# Patient Record
Sex: Female | Born: 1935 | Race: Black or African American | Hispanic: No | Smoking: Never smoker
Health system: Southern US, Community
[De-identification: ages and names within clinical notes are randomized; demographics above are authoritative.]

## PROBLEM LIST (undated history)

## (undated) DIAGNOSIS — M109 Gout, unspecified: Secondary | ICD-10-CM

## (undated) DIAGNOSIS — C50919 Malignant neoplasm of unspecified site of unspecified female breast: Secondary | ICD-10-CM

## (undated) DIAGNOSIS — R945 Abnormal results of liver function studies: Secondary | ICD-10-CM

## (undated) DIAGNOSIS — E119 Type 2 diabetes mellitus without complications: Secondary | ICD-10-CM

## (undated) DIAGNOSIS — I10 Essential (primary) hypertension: Secondary | ICD-10-CM

## (undated) DIAGNOSIS — E785 Hyperlipidemia, unspecified: Secondary | ICD-10-CM

## (undated) DIAGNOSIS — C259 Malignant neoplasm of pancreas, unspecified: Secondary | ICD-10-CM

## (undated) DIAGNOSIS — R7989 Other specified abnormal findings of blood chemistry: Secondary | ICD-10-CM

## (undated) DIAGNOSIS — E079 Disorder of thyroid, unspecified: Secondary | ICD-10-CM

## (undated) DIAGNOSIS — K219 Gastro-esophageal reflux disease without esophagitis: Secondary | ICD-10-CM

## (undated) DIAGNOSIS — C55 Malignant neoplasm of uterus, part unspecified: Secondary | ICD-10-CM

## (undated) HISTORY — PX: ABDOMINAL HYSTERECTOMY: SHX81

## (undated) HISTORY — PX: KNEE ARTHROSCOPY: SUR90

---

## 1958-08-18 DIAGNOSIS — C55 Malignant neoplasm of uterus, part unspecified: Secondary | ICD-10-CM

## 1958-08-18 HISTORY — DX: Malignant neoplasm of uterus, part unspecified: C55

## 2004-06-13 ENCOUNTER — Ambulatory Visit: Payer: Self-pay | Admitting: Gastroenterology

## 2004-11-28 ENCOUNTER — Ambulatory Visit: Payer: Self-pay | Admitting: Unknown Physician Specialty

## 2005-06-23 ENCOUNTER — Inpatient Hospital Stay: Payer: Self-pay | Admitting: Orthopaedic Surgery

## 2005-06-27 ENCOUNTER — Encounter: Payer: Self-pay | Admitting: Internal Medicine

## 2005-07-18 ENCOUNTER — Encounter: Payer: Self-pay | Admitting: Internal Medicine

## 2005-08-18 ENCOUNTER — Encounter: Payer: Self-pay | Admitting: Internal Medicine

## 2005-09-18 ENCOUNTER — Encounter: Payer: Self-pay | Admitting: Internal Medicine

## 2005-10-16 ENCOUNTER — Encounter: Payer: Self-pay | Admitting: Internal Medicine

## 2005-11-16 ENCOUNTER — Encounter: Payer: Self-pay | Admitting: Internal Medicine

## 2005-12-16 ENCOUNTER — Encounter: Payer: Self-pay | Admitting: Internal Medicine

## 2006-01-06 ENCOUNTER — Ambulatory Visit: Payer: Self-pay | Admitting: Unknown Physician Specialty

## 2006-01-16 ENCOUNTER — Encounter: Payer: Self-pay | Admitting: Internal Medicine

## 2006-02-15 ENCOUNTER — Encounter: Payer: Self-pay | Admitting: Internal Medicine

## 2006-03-18 ENCOUNTER — Encounter: Payer: Self-pay | Admitting: Internal Medicine

## 2006-04-18 ENCOUNTER — Encounter: Payer: Self-pay | Admitting: Internal Medicine

## 2006-05-18 ENCOUNTER — Encounter: Payer: Self-pay | Admitting: Internal Medicine

## 2006-06-18 ENCOUNTER — Encounter: Payer: Self-pay | Admitting: Internal Medicine

## 2007-01-12 ENCOUNTER — Ambulatory Visit: Payer: Self-pay | Admitting: Unknown Physician Specialty

## 2007-01-15 ENCOUNTER — Ambulatory Visit: Payer: Self-pay | Admitting: Unknown Physician Specialty

## 2008-03-14 ENCOUNTER — Ambulatory Visit: Payer: Self-pay | Admitting: Unknown Physician Specialty

## 2008-09-29 ENCOUNTER — Ambulatory Visit: Payer: Self-pay | Admitting: Unknown Physician Specialty

## 2009-03-26 ENCOUNTER — Inpatient Hospital Stay: Payer: Self-pay | Admitting: Internal Medicine

## 2009-04-10 ENCOUNTER — Ambulatory Visit: Payer: Self-pay | Admitting: Unknown Physician Specialty

## 2009-04-17 ENCOUNTER — Inpatient Hospital Stay: Payer: Self-pay | Admitting: Internal Medicine

## 2009-05-01 ENCOUNTER — Observation Stay: Payer: Self-pay | Admitting: Internal Medicine

## 2009-05-05 ENCOUNTER — Encounter: Payer: Self-pay | Admitting: Internal Medicine

## 2009-05-18 ENCOUNTER — Encounter: Payer: Self-pay | Admitting: Internal Medicine

## 2009-10-17 ENCOUNTER — Ambulatory Visit: Payer: Self-pay | Admitting: Nephrology

## 2010-01-15 ENCOUNTER — Ambulatory Visit: Payer: Self-pay | Admitting: Unknown Physician Specialty

## 2010-02-22 ENCOUNTER — Encounter: Admission: RE | Admit: 2010-02-22 | Discharge: 2010-02-22 | Payer: Self-pay | Admitting: Diagnostic Neuroimaging

## 2010-05-14 ENCOUNTER — Ambulatory Visit: Payer: Self-pay | Admitting: Unknown Physician Specialty

## 2010-08-18 HISTORY — PX: BREAST BIOPSY: SHX20

## 2010-10-16 ENCOUNTER — Ambulatory Visit: Payer: Self-pay | Admitting: Unknown Physician Specialty

## 2010-12-10 ENCOUNTER — Ambulatory Visit: Payer: Self-pay | Admitting: General Practice

## 2010-12-23 ENCOUNTER — Inpatient Hospital Stay: Payer: Self-pay | Admitting: General Practice

## 2010-12-27 ENCOUNTER — Encounter: Payer: Self-pay | Admitting: Internal Medicine

## 2011-01-17 ENCOUNTER — Encounter: Payer: Self-pay | Admitting: Internal Medicine

## 2011-02-16 ENCOUNTER — Encounter: Payer: Self-pay | Admitting: Internal Medicine

## 2011-03-19 ENCOUNTER — Encounter: Payer: Self-pay | Admitting: Internal Medicine

## 2011-04-02 ENCOUNTER — Emergency Department: Payer: Self-pay | Admitting: *Deleted

## 2011-07-16 ENCOUNTER — Ambulatory Visit: Payer: Self-pay | Admitting: Unknown Physician Specialty

## 2011-07-28 ENCOUNTER — Ambulatory Visit: Payer: Self-pay | Admitting: Unknown Physician Specialty

## 2011-08-18 ENCOUNTER — Ambulatory Visit: Payer: Self-pay | Admitting: Surgery

## 2011-08-19 DIAGNOSIS — C50919 Malignant neoplasm of unspecified site of unspecified female breast: Secondary | ICD-10-CM

## 2011-08-19 HISTORY — DX: Malignant neoplasm of unspecified site of unspecified female breast: C50.919

## 2011-08-25 ENCOUNTER — Ambulatory Visit: Payer: Self-pay | Admitting: Surgery

## 2011-09-08 LAB — PATHOLOGY REPORT

## 2011-09-09 ENCOUNTER — Ambulatory Visit: Payer: Self-pay | Admitting: Surgery

## 2011-09-17 ENCOUNTER — Ambulatory Visit: Payer: Self-pay | Admitting: Oncology

## 2011-09-23 ENCOUNTER — Ambulatory Visit: Payer: Self-pay | Admitting: Oncology

## 2011-10-17 ENCOUNTER — Ambulatory Visit: Payer: Self-pay | Admitting: Oncology

## 2011-11-17 ENCOUNTER — Ambulatory Visit: Payer: Self-pay | Admitting: Oncology

## 2011-12-24 ENCOUNTER — Ambulatory Visit: Payer: Self-pay | Admitting: Oncology

## 2012-01-17 ENCOUNTER — Ambulatory Visit: Payer: Self-pay | Admitting: Oncology

## 2012-03-10 ENCOUNTER — Ambulatory Visit: Payer: Self-pay | Admitting: Gastroenterology

## 2012-07-05 ENCOUNTER — Ambulatory Visit: Payer: Self-pay | Admitting: Oncology

## 2012-07-18 ENCOUNTER — Ambulatory Visit: Payer: Self-pay | Admitting: Oncology

## 2012-07-27 ENCOUNTER — Ambulatory Visit: Payer: Self-pay | Admitting: Surgery

## 2012-11-15 ENCOUNTER — Telehealth: Payer: Self-pay | Admitting: *Deleted

## 2012-11-15 NOTE — Telephone Encounter (Signed)
Refill request  Pravastatin 40 mg tablet  #30  Take one tablet by mouth every day

## 2012-11-16 NOTE — Telephone Encounter (Signed)
i have not seen this pt and she does not have an appt scheduled with me.  Please notify pharmacy.

## 2012-11-17 NOTE — Telephone Encounter (Signed)
This patient has not been seen in this office. Does not have an appointment with Dr. Lorin Picket

## 2013-01-13 ENCOUNTER — Observation Stay: Payer: Self-pay | Admitting: Internal Medicine

## 2013-01-13 LAB — CBC
HCT: 27.2 % — ABNORMAL LOW (ref 35.0–47.0)
MCH: 30.2 pg (ref 26.0–34.0)
MCV: 89 fL (ref 80–100)
RBC: 3.05 10*6/uL — ABNORMAL LOW (ref 3.80–5.20)
RDW: 13.1 % (ref 11.5–14.5)
WBC: 6 10*3/uL (ref 3.6–11.0)

## 2013-01-13 LAB — COMPREHENSIVE METABOLIC PANEL
Albumin: 3.1 g/dL — ABNORMAL LOW (ref 3.4–5.0)
BUN: 39 mg/dL — ABNORMAL HIGH (ref 7–18)
Bilirubin,Total: 0.3 mg/dL (ref 0.2–1.0)
Creatinine: 2.22 mg/dL — ABNORMAL HIGH (ref 0.60–1.30)
EGFR (African American): 23 — ABNORMAL LOW
EGFR (Non-African Amer.): 20 — ABNORMAL LOW
Osmolality: 286 (ref 275–301)
SGPT (ALT): 16 U/L (ref 12–78)
Total Protein: 6.8 g/dL (ref 6.4–8.2)

## 2013-01-14 LAB — CBC WITH DIFFERENTIAL/PLATELET
Eosinophil %: 4.2 %
HCT: 33.4 % — ABNORMAL LOW (ref 35.0–47.0)
HGB: 11.3 g/dL — ABNORMAL LOW (ref 12.0–16.0)
Lymphocyte #: 1.5 10*3/uL (ref 1.0–3.6)
Lymphocyte %: 26.9 %
MCV: 89 fL (ref 80–100)
Monocyte #: 0.5 x10 3/mm (ref 0.2–0.9)
Monocyte %: 8.7 %
Neutrophil #: 3.4 10*3/uL (ref 1.4–6.5)
Neutrophil %: 59.3 %
RBC: 3.76 10*6/uL — ABNORMAL LOW (ref 3.80–5.20)
RDW: 13.2 % (ref 11.5–14.5)

## 2013-01-14 LAB — BASIC METABOLIC PANEL
Anion Gap: 5 — ABNORMAL LOW (ref 7–16)
Co2: 27 mmol/L (ref 21–32)
Creatinine: 2.38 mg/dL — ABNORMAL HIGH (ref 0.60–1.30)
EGFR (African American): 21 — ABNORMAL LOW
EGFR (Non-African Amer.): 19 — ABNORMAL LOW
Glucose: 117 mg/dL — ABNORMAL HIGH (ref 65–99)
Osmolality: 286 (ref 275–301)
Sodium: 138 mmol/L (ref 136–145)

## 2013-01-24 ENCOUNTER — Non-Acute Institutional Stay (SKILLED_NURSING_FACILITY): Payer: Medicare PPO | Admitting: Internal Medicine

## 2013-01-24 DIAGNOSIS — N179 Acute kidney failure, unspecified: Secondary | ICD-10-CM

## 2013-01-24 DIAGNOSIS — M25551 Pain in right hip: Secondary | ICD-10-CM

## 2013-01-24 DIAGNOSIS — I10 Essential (primary) hypertension: Secondary | ICD-10-CM

## 2013-01-24 DIAGNOSIS — M25559 Pain in unspecified hip: Secondary | ICD-10-CM | POA: Insufficient documentation

## 2013-01-24 DIAGNOSIS — E669 Obesity, unspecified: Secondary | ICD-10-CM

## 2013-01-24 DIAGNOSIS — E119 Type 2 diabetes mellitus without complications: Secondary | ICD-10-CM | POA: Insufficient documentation

## 2013-01-24 DIAGNOSIS — N184 Chronic kidney disease, stage 4 (severe): Secondary | ICD-10-CM | POA: Insufficient documentation

## 2013-01-24 DIAGNOSIS — N185 Chronic kidney disease, stage 5: Secondary | ICD-10-CM

## 2013-01-24 DIAGNOSIS — E1169 Type 2 diabetes mellitus with other specified complication: Secondary | ICD-10-CM | POA: Insufficient documentation

## 2013-01-24 NOTE — Progress Notes (Signed)
Patient ID: Pamela Preston, female   DOB: 05-29-1930, 79 y.o.   MRN: [id number]    PCP: Maurine Minister  Code Status: full code  Allergies  Allergen Reactions  . Colchicine   . Ketorolac   . Lipitor (Atorvastatin)     Chief Complaint: new admit post hospitalization 01/13/13- 01/19/13  HPI:  79 y/o female patient with history of HTN, DM, stage 4 CKD among others was admitted with severe right hip pain of sudden onset and RLQ pain. Ct abdomen pelvis and hip xray were negative. Orthopedic was consulted and recommended MRI but could not be done due to her body habitus.. Bone scan was done instead and showed DJD. Pain medications and muscle relaxant were given and PT team was consulted. STR was recommended Patient was seen in her room today. She mentions her pain has subsided. Denies any numbness or tingling. Is working with therapy and feels that is helping her. Appetite is good. Her lasix was held over the weekend with her creatinine level rising by on call physician Denies chest pain or SOB  Review of Systems  Constitutional: Negative for fever, chills and diaphoresis.  HENT: Negative for sore throat.   Eyes: Negative for blurred vision.  Respiratory: Negative for cough, shortness of breath and wheezing.   Cardiovascular: Negative for chest pain, palpitations and leg swelling.  Gastrointestinal: Negative for heartburn, nausea, abdominal pain and constipation.  Genitourinary: Negative for dysuria.  Skin: Negative for rash.  Neurological: Positive for weakness. Negative for dizziness and headaches.  Psychiatric/Behavioral: Negative for memory loss.   Past medical history- DM, HTN, Hyperlipidemia, gout, stage 4 CKD, history of breast cancer  Past surgical history- hysterectomy  Social history- lives alone, denies smoking, alcohol or illicit drug use  Medications: Patient's Medications  New Prescriptions   No medications on file  Previous Medications   AMLODIPINE (NORVASC) 10 MG  TABLET    Take 10 mg by mouth daily.   ASPIRIN 81 MG TABLET    Take 81 mg by mouth daily.   FEBUXOSTAT (ULORIC) 40 MG TABLET    Take 80 mg by mouth daily.   FUROSEMIDE (LASIX) 20 MG TABLET    Take 20 mg by mouth daily.   GLIPIZIDE (GLUCOTROL) 5 MG TABLET    Take 5 mg by mouth 2 (two) times daily before a meal.   INSULIN GLARGINE (LANTUS) 100 UNIT/ML INJECTION    Inject 35 Units into the skin at bedtime.   LETROZOLE (FEMARA) 2.5 MG TABLET    Take 2.5 mg by mouth daily.   LEVOTHYROXINE (SYNTHROID, LEVOTHROID) 75 MCG TABLET    Take 75 mcg by mouth daily before breakfast.   LISINOPRIL (PRINIVIL,ZESTRIL) 40 MG TABLET    Take 40 mg by mouth daily.   METAXALONE (SKELAXIN) 800 MG TABLET    Take 800 mg by mouth 3 (three) times daily.   METOPROLOL (LOPRESSOR) 50 MG TABLET    Take 50 mg by mouth daily.   OMEPRAZOLE (PRILOSEC) 20 MG CAPSULE    Take 20 mg by mouth daily.   OXYCODONE-ACETAMINOPHEN (PERCOCET/ROXICET) 5-325 MG PER TABLET    Take 1 tablet by mouth every 4 (four) hours as needed for pain.   PRAVASTATIN (PRAVACHOL) 40 MG TABLET    Take 40 mg by mouth daily.  Modified Medications   No medications on file  Discontinued Medications   No medications on file    Physical Exam: Vitals- 115/62, 60/min, 98.3, 18/min, 95% on room air  gen- adult, morbidly obese female  patient , pleasant, in NAD heent- no pallor, no icterus, no LAD, MMM cvs- n s1, s2, rrr respi- b/l CTA, no wheeze or rhonchi abdo- bs+, soft, non tender Ext- able to move all 4 Neuro- no focal deficit, aaox 3  Labs reviewed:  Prior to discharge from hospital- Wbc 5.8, hb 11.3, hct 33.4, plt 281, na 138, k 4.3, bun 38, cr 2.3, glu 117, ca 8.9  01/21/13- na 139, k 5, cl 101, co2 29, glu 51, bun 70, cr 3.9, ca 9.3, cgfr AA 14.14  Radiological Exams: Ct abdomen/pelvis- no renal calculus or hydronephrosis. Sludge or stones in gall bladder present. Small nodular density in pancreatic head related to fatty infiltration present. S/p  hysterectomy. Sigmoid diverticulosis present  Right hip xray- DJD, sunchondral sclerosis  Bone scan suggestive of DJD  Assessment/Plan  Right hip pain- improved. Continue Physical therapy and current pain regimen with metaxalone for muscle spasm. Fall precautions  HTN- bp well controlled. Continue amlodipine, lisinopril and metoprolol. No documented CAD or chf history on record review. Pt also mentions no history of chf. No edema on exam. Will hold of lasix given worsening renal function along with lisinopril. Monitor bp q shift  DM- continue lantus and glipizide. cbg has remianed well controlled. infact recent bmp review shows low blood glucose reading. No other hypoglycemic readings in facility. Monitor cbg. Continue asa and statin with ACEI  ARF- has acute on CKD with worsening creatinine  Clearance of < 15 ml/min. Will hold lasix and lisinopril for now. No uremic signs/ symptoms.bp well controlled. Today's bmp pending. Will also get repeat check in a week  Stage 5 CKD- follows with [facility] nephrology. Lasix and lisinopril to be held for now with worsening of creatinine clearance on recent lab draw. Hold lasix for now and check bmp in a week.   Breast cancer- Continue femara for her breast cancer   Gout- no recent attack, continue uloric for her gout  Family/ staff Communication: reviewed care plan for STR with patient and nursing supervisor  Labs/tests ordered- bmp in 1 week

## 2013-07-04 ENCOUNTER — Ambulatory Visit: Payer: Self-pay | Admitting: Radiation Oncology

## 2013-07-28 ENCOUNTER — Ambulatory Visit: Payer: Self-pay | Admitting: Surgery

## 2013-11-19 ENCOUNTER — Emergency Department: Payer: Self-pay | Admitting: Emergency Medicine

## 2013-11-19 LAB — URINALYSIS, COMPLETE
BACTERIA: NONE SEEN
BILIRUBIN, UR: NEGATIVE
BLOOD: NEGATIVE
Glucose,UR: NEGATIVE mg/dL (ref 0–75)
KETONE: NEGATIVE
Nitrite: NEGATIVE
Ph: 6 (ref 4.5–8.0)
Protein: 100
RBC,UR: 3 /HPF (ref 0–5)
Specific Gravity: 1.012 (ref 1.003–1.030)

## 2013-11-19 LAB — COMPREHENSIVE METABOLIC PANEL
ANION GAP: 5 — AB (ref 7–16)
Albumin: 3.3 g/dL — ABNORMAL LOW (ref 3.4–5.0)
Alkaline Phosphatase: 94 U/L
BUN: 18 mg/dL (ref 7–18)
Bilirubin,Total: 0.4 mg/dL (ref 0.2–1.0)
CO2: 30 mmol/L (ref 21–32)
Calcium, Total: 8.7 mg/dL (ref 8.5–10.1)
Chloride: 103 mmol/L (ref 98–107)
Creatinine: 2.31 mg/dL — ABNORMAL HIGH (ref 0.60–1.30)
GFR CALC AF AMER: 22 — AB
GFR CALC NON AF AMER: 19 — AB
Glucose: 126 mg/dL — ABNORMAL HIGH (ref 65–99)
Osmolality: 279 (ref 275–301)
POTASSIUM: 3.8 mmol/L (ref 3.5–5.1)
SGOT(AST): 16 U/L (ref 15–37)
SGPT (ALT): 13 U/L (ref 12–78)
Sodium: 138 mmol/L (ref 136–145)
TOTAL PROTEIN: 7.4 g/dL (ref 6.4–8.2)

## 2013-11-19 LAB — CBC
HCT: 35.7 % (ref 35.0–47.0)
HGB: 11.8 g/dL — ABNORMAL LOW (ref 12.0–16.0)
MCH: 28.9 pg (ref 26.0–34.0)
MCHC: 33 g/dL (ref 32.0–36.0)
MCV: 88 fL (ref 80–100)
PLATELETS: 266 10*3/uL (ref 150–440)
RBC: 4.07 10*6/uL (ref 3.80–5.20)
RDW: 14 % (ref 11.5–14.5)
WBC: 6.1 10*3/uL (ref 3.6–11.0)

## 2013-11-19 LAB — LIPASE, BLOOD: Lipase: 82 U/L (ref 73–393)

## 2013-11-20 LAB — URINE CULTURE

## 2014-02-16 ENCOUNTER — Ambulatory Visit: Payer: Self-pay | Admitting: Physician Assistant

## 2014-08-03 ENCOUNTER — Ambulatory Visit: Payer: Self-pay | Admitting: Surgery

## 2014-12-08 NOTE — H&P (Signed)
PATIENT NAME:  Pamela Preston, Pamela Preston MR#:  [id number] DATE OF BIRTH:  Jan 03, 1930  DATE OF ADMISSION:  01/12/2013  PRIMARY CARE PHYSICIAN: Paulita Cradle, P.A.-C.   REFERRING PHYSICIAN: Dr. Marjean Donna.   CHIEF COMPLAINT: Severe right hip pain.   HISTORY OF PRESENT ILLNESS: Pamela Preston is a pleasant 81 year old African American female who was in her usual state of health until last evening when she was at church. She was sitting on a toilet stool, and when she went to stand up she had severe pain in the right groin and hip area. She was unable to stand up or walk. EMS was called and the patient was transported to the Emergency Department. The patient states that her pain is sharp in nature, the severity is  10 on a scale of 10, nonradiating, except to the right inguinal groin area and hip.   The patient has chronic anemia and a history of diverticulosis.   Evaluation in the Emergency Department with CAT scan of the abdomen and pelvis failed to show any reason for her pain, and there was no evidence of fracture of the pelvis or the hips. The patient received pain control and when it was attempted again to get her out of bed and walk her, she could not stand up or walk. The patient was admitted to the hospital for observation, and I am planning to consult orthopedics as well.   REVIEW OF SYSTEMS:  CONSTITUTIONAL: Denies any fever. No chills. No fatigue.  EYES: No blurring of vision. No double vision.  ENT: No hearing impairment. No sore throat. No dysphagia.  CARDIOVASCULAR: No chest pain. No shortness of breath. No edema. No syncope.  RESPIRATORY: No shortness of breath. No cough. No hemoptysis.  GASTROINTESTINAL: No abdominal pain. No vomiting. No diarrhea.  GENITOURINARY: No dysuria. No frequency of urination.  MUSCULOSKELETAL: Apart from the right hip pain, no other joint pain. No muscular pain or swelling.  INTEGUMENTARY: No skin rash. No ulcers.  NEUROLOGY: No focal weakness. No seizure  activity. No headache.  PSYCHIATRY: No anxiety. No depression.   ENDOCRINE: No polyuria or polydipsia. No heat or cold intolerance.   PAST MEDICAL HISTORY: Right breast cancer, status post lumpectomy; followed up by  Dr. Oliva Bustard; systemic hypertension, diabetes mellitus type 2, hyperlipidemia, gastroesophageal reflux disease, osteoarthritis, gout, chronic kidney disease, stage IV.   PAST SURGICAL HISTORY: Left knee joint replacement, partial hysterectomy, and right breast lumpectomy.   SOCIAL HABITS: Nonsmoker. No history of alcohol or drug abuse.   SOCIAL HISTORY: She is widowed. Lives at home alone.   FAMILY HISTORY: Her mother died at age of 70 from lung cancer. Her father died from complications of a stroke at age of 41. She had a brother who died from a heart attack at the age of 54.   ADMISSION MEDICATIONS: She is on Femara  2.5 mg once a day, aspirin 81 mg a day, amlodipine 10 mg a day, furosemide 20 mg a day, glipizide 5 mg a day, Lantus 35 units once a day, levothyroxine 75 mcg once a day, lisinopril 40 mg a day, metoprolol tartrate 100 mg once a day, omeprazole 20 mg once a day, pravastatin 40 mg a day, Uloric 40 mg once a day.   ALLERGIES: COLCHICINE, causing GI side-effects. KETOROLAC. She is also ALLERGIC TO MILK.   PHYSICAL EXAMINATION: VITAL SIGNS: Blood pressure 102/54, respiratory rate 16, pulse 65, temperature 98.2, oxygen saturation 95%.  GENERAL APPEARANCE: Elderly female laying in bed in no acute distress.  She is morbidly obese.  HEAD AND NECK: No pallor. No icterus. No cyanosis. Ear examination revealed normal hearing, no discharge, no lesions. Examination of the nose showed no bleeding, no ulcers, no discharge. Oropharyngeal examination revealed normal lips and tongue. No oral thrush. No ulcers. Eye examination revealed normal eyelids and conjunctivae. Pupils are constricted, but equal. Could not see reactivity to light.  NECK: Supple. Trachea is midline. No thyromegaly.  No cervical lymphadenopathies.  HEART EXAM: Normal S1, S2. No S3 or S4. There is a mid-systolic murmur, grade 2/6 heard at the left sternal border and aortic area. No carotid bruits.   RESPIRATORY EXAM: Showed normal breathing pattern without use of accessory muscles. No rales. No wheezing.  ABDOMEN: Morbidly obese, soft, without tenderness. No hepatosplenomegaly. No masses. No hernias.  SKIN EXAMINATION:  Revealed no ulcers. No subcutaneous nodules.  MUSCULOSKELETAL: No joint swelling. No clubbing. She had severe pain at the right hip area. Unable to turn in bed. She is able to move her feet.  NEUROLOGIC: Cranial nerves II-XII are intact. No focal motor deficit.  PSYCHIATRIC EVALUATION: The patient is alert and oriented x 3. Mood and affect were normal.   LABORATORY FINDINGS: CAT scan of the abdomen and pelvis was negative.   EKG showed normal sinus rhythm at rate of 83 per minute. Unremarkable EKG except for tendency for mild criteria for LVH.   Serum glucose 179, BUN 39, creatinine 2.2, sodium 136, potassium 4.4. Normal liver function tests and liver transaminases.   CBC showed white count of 6000, hemoglobin 9.2, hematocrit 27, platelet count 219.   ASSESSMENT: 1.  Severe right hip pain, etiology is unclear.  2.  Diabetes mellitus, type 2. 3.  Chronic kidney disease, stage IV.  4.  Systemic hypertension.  5.  Gout.  6.  Hyperlipidemia.  7.  Morbid obesity.  8.  History of right breast cancer.  9.  Chronic anemia. 10. History of diverticulosis.  PLAN: Will admit the patient for observation. Consult orthopedic surgery. Pain control. If there is no improvement may consider doing MRI of the right hip to rule out osteonecrosis or occult fracture. Physical therapy consult. Accu-Cheks and sliding-scale. Continue home medications as listed above.   For deep vein thrombosis prophylaxis, we will use heparin subcutaneously.   The patient indicates that she has a LIVING WILL. Her CODE  STATUS is FULL CODE.   Time spent evaluating this patient: More than 55 minutes.   ____________________________ Clovis Pu. Lenore Manner, MD amd:dm D: 01/13/2013 05:21:00 ET T: 01/13/2013 08:01:25 ET JOB#: [id number]  cc: Clovis Pu. Lenore Manner, MD, <Dictator> Mike Craze Irven Coe MD ELECTRONICALLY SIGNED 01/17/2013 1:08

## 2014-12-08 NOTE — Consult Note (Signed)
PATIENT NAME:  Pamela Preston, GODSIL MR#:  [id number] DATE OF BIRTH:  03/09/1930  DATE OF CONSULTATION:  01/14/2013  REFERRING PHYSICIAN:   CONSULTING PHYSICIAN:  Laurene Footman, MD  REASON FOR CONSULTATION: Right hip pain.   HISTORY: The patient is an 81 year old who got up from a commode and was unable to ambulate on 01/12/2013. She came into the Emergency Room and was admitted early in the a.m. on 01/13/2013. I saw her yesterday, and she was noted to have hip pain, pain with attempted hip flexion. There was pretty significant concern for possible fracture. Distally, she has prior evidence of knee surgery with well-healed scars. Her skin is intact around the leg. She has severe pain with hip internal and external rotation. She has weakness to hip flexion and abduction with anterior groin pain.   Her CT was reviewed, and it showed no fractures. There is osteopenia. A subsequent hip x-ray did show significant central hip arthritis. There is additionally history of gout. I ordered an MRI, but she is too large for that with the in-hospital unit, and bone scan has been ordered. That is to be done today to assess for occult fracture. If this is negative for a fracture, I will put in an order for her to start physical therapy, weight-bearing as tolerated and hope we can mobilize her. It may be that she may require a hip injection, although with her diabetes, that will raise her blood sugars.   I will continue to follow her while she is hospitalized.    ____________________________ Laurene Footman, MD mjm:OSi D: 01/14/2013 13:53:32 ET T: 01/14/2013 14:04:54 ET JOB#: [id number]  cc: Laurene Footman, MD, <Dictator> Laurene Footman MD ELECTRONICALLY SIGNED 01/15/2013 21:36

## 2014-12-08 NOTE — Discharge Summary (Signed)
PATIENT NAME:  Pamela Preston, BOKHARI MR#:  [id number] DATE OF BIRTH:  [birth year]  DATE OF ADMISSION:  01/13/2013 DATE OF DISCHARGE:  01/19/2013  ADMITTING PHYSICIAN: Clovis Pu. Lenore Manner, MD  DISCHARGING PHYSICIAN: Gladstone Lighter, MD  PRIMARY CARE PHYSICIAN: Paulita Cradle, PA-C  [place]: Orthopedic consultation by Dr. Rudene Christians.   DISCHARGE DIAGNOSES: 1.  Severe right hip pain, sudden onset, secondary to degenerative joint disease.  2.  Diabetes mellitus.  3.  Hypertension.  4.  Hyperlipidemia.  5.  Gout.  6.  Stage IV chronic kidney disease, following up with [facility].  7.  History of breast cancer.   DISCHARGE HOME MEDICATIONS:  1.  Amlodipine 10 mg p.o. daily.  2.  Lasix 20 mg p.o. in the morning. 3.  Lantus 35 units subcutaneous at bedtime.  4.  Omeprazole 20 mg p.o. daily.  5.  Pravastatin 40 mg p.o. daily.  6.  Uloric 40 mg p.o. daily.  7.  Aspirin 81 mg p.o. daily.  8.  Lisinopril 40 mg p.o. daily.  9.  Femara 2.5 mg p.o. daily.  10.  Levothyroxine 75 mcg p.o. daily.  11.  Percocet 5/325 mg 1 tablet every 4 hours as needed for pain.  12.  Glipizide 5 mg p.o. daily.  13.  Skelaxin 800 mg p.o. 3 times a day as needed.  14.  Metoprolol 50 mg p.o. daily.   DISCHARGE DIET: Low-sodium and ADA 1800 calorie diet.   DISCHARGE ACTIVITY: As tolerated.    FOLLOWUP INSTRUCTIONS: 1.  PCP followup in 1 to 2 weeks.  2.  Physical therapy.   LABORATORY AND IMAGING STUDIES: Prior to discharge: WBC 5.8, hemoglobin 11.3, hematocrit 33.4, platelet count 281.   Sodium 138, potassium 4.3, chloride 106, bicarbonate 27, BUN 38, creatinine 2.3, glucose 117, calcium of 8.9.  CT of the abdomen and pelvis without contrast showing no renal calculus or hydronephrosis. Sludge or stones in the gallbladder present. Small nodular density in pancreatic head related to fatty infiltration present. The patient is status post hysterectomy. Diverticulosis of sigmoid colon seen.    Right hip x-ray showing degenerative joint space narrowing in the right hip. Subchondral sclerosis is present. No other deformity and no fractures noted.   Bone scan done showing no abnormal tracer activity in the hips. Scattered foci of activity in axial and appendicular skeleton, likely degenerative changes.   BRIEF HOSPITAL COURSE: Pamela Preston is an 81 year old obese African American female with past medical history significant for hypertension, diabetes and hyperlipidemia, who was brought in secondary to severe right hip pain and right lower quadrant abdominal pain on presentation after she sat on the commode and was not able to get up because of sudden onset of pain.  1.  Right hip pain: Initially patient was showing in the right lower quadrant of the abdomen so a CT of the abdomen and pelvis was down, which was completely negative. The patient had no tenderness on palpation and on further moving, it was noticed that she had right hip pain and was not able to bear weight initially. X-ray was done which was negative. She was seen by orthopedic physician, Dr. Rudene Christians, who requested an MRI for the hip for any subclinical fractures, but because of the patient's high BMI and obesity, she was not going to fit in the MRI machine and bone scan was done instead and it only showed degenerative changes, so she was given pain medication, advised to work with physical therapy, which she has been  doing and has some clinical improvement with physical therapy. The patient still will need some acute rehab and cannot live at home by herself, so she is being discharged to [facility] She is on Skelaxin muscle relaxant and also as-needed Percocet for pain at this time.  2.  Diabetes mellitus: Her sugars were stable while in the hospital. Her glipizide and Lantus are being continued at the same doses.  3.  Hypertension: The patient is on amlodipine, Lasix, lisinopril and metoprolol. Her metoprolol dose was actually  decreased from 100 mg to 50 mg because of asymptomatic bradycardia while in the hospital.  4.  Stage IV CKD: Appears stable at this time. The patient can follow up with [facility] as an outpatient. Her course has been otherwise uneventful in the hospital.   DISCHARGE CONDITION: Stable.   DISCHARGE DISPOSITION: To short-term rehab. The patient has selected [facility]   TOTAL TIME SPENT ON DISCHARGE: 45 minutes.    ____________________________ Gladstone Lighter, MD rk:jm D: 01/19/2013 14:59:48 ET T: 01/19/2013 15:45:09 ET JOB#: [id number]  cc: Gladstone Lighter, MD, <Dictator> Paulita Cradle, PA-C Gladstone Lighter MD ELECTRONICALLY SIGNED 01/24/2013 15:33

## 2014-12-08 NOTE — Consult Note (Signed)
Brief Consult Note: Diagnosis: hip pain, undetermined etiology.   Patient was seen by consultant.   Recommend further assessment or treatment.   Orders entered.   Comments: MRI and xray right hip ordered. suspect occult fracture or psoas injury continue bed rest until MRI eval completed.  Electronic Signatures: Laurene Footman (MD)  (Signed 29-May-14 13:45)  Authored: Brief Consult Note   Last Updated: 29-May-14 13:45 by Laurene Footman (MD)

## 2014-12-10 NOTE — Op Note (Signed)
PATIENT NAME:  Pamela Preston, Pamela Preston MR#:  [id number] DATE OF BIRTH:  05-Jul-1930  DATE OF PROCEDURE:  08/25/2011  PREOPERATIVE DIAGNOSIS: Right breast mass.   POSTOPERATIVE DIAGNOSIS: Right breast mass.   PROCEDURE: Excision right breast mass.   SURGEON: Loreli Dollar, MD  ANESTHESIA: General.   INDICATIONS: This 81 year old female recently had a mammogram depicting a mass in the upper outer central aspect of the right breast and excision was recommended for further evaluation as it raised some suspicion for cancer. She had preoperative x-ray needle localization and her x-ray images were viewed.  DESCRIPTION OF PROCEDURE: She was placed on the operating table in the supine position under general anesthesia. The dressing was removed from the upper aspect of the right breast. The Kopans wire was cut 3 cm from the skin. It exited the skin through the upper aspect of the breast slightly lateral to the nipple line. This site was prepared with ChloraPrep, draped in a sterile manner.   A curvilinear transversely oriented incision was made just above the entrance point of the wire and carried down through subcutaneous tissues and could palpate a nodule within the fatty tissue. Fatty tissue surrounding the wire was excised removing a portion of tissue which was approximately 4 x 4 x 4 cm in dimension and this was submitted for specimen mammogram and routine pathology. It is further noted that there was normal appearing fatty tissue completely surrounding the palpable mass. The wound was inspected. Several small bleeding points were cauterized. There was no remaining mass seen within the operative field. Next, the wound was closed with a running 4-0 chromic subcuticular suture and Dermabond. The patient tolerated surgery satisfactorily and was then prepared for transfer to the recovery room. ____________________________ Lenna Sciara. Rochel Brome, MD jws:cms D: 08/25/2011 11:13:29 ET T: 08/25/2011 12:19:00  ET  JOB#: [id number] Loreli Dollar MD ELECTRONICALLY SIGNED 09/21/2011 15:03

## 2014-12-10 NOTE — Op Note (Signed)
PATIENT NAME:  Pamela Preston, Pamela Preston MR#:  [id number] DATE OF BIRTH:  [birth year]  DATE OF PROCEDURE:  09/09/2011  PREOPERATIVE DIAGNOSIS: Carcinoma of the right breast.   POSTOPERATIVE DIAGNOSIS: Carcinoma of the right breast.   PROCEDURE: Right axillary sentinel lymph node biopsy.   SURGEON: Loreli Dollar, MD   ASSISTANT: Britta Mccreedy, PA   ANESTHESIA: General.   INDICATIONS: This is a 81 year old female who recently had excision of a right breast mass which was an infiltrating ductal carcinoma and sentinel lymph node biopsy was recommended for staging and determination of whether she needs chemotherapy or not. She did have preoperative injection of radioactive technetium sulfur colloid.   It is also noted that the patient is morbidly obese at 5 feet, 8 inches and 300 pounds with a BMI of 45.6 and I requested Britta Mccreedy to assist due to the large size of the patient anticipating a deep dissection and also noting the possibility of having to continue with axillary lymph node dissection in the event of finding significant disease.   PROCEDURE: With the patient on the operating table in the supine position, she was placed under general anesthesia. Both arms were placed on lateral arm rests. The right axilla and surrounding upper arm and chest wall were prepared with ChloraPrep and draped in a sterile manner.   The gamma counter was used in the axilla to demonstrate location of radioactivity. An oblique incision was made some 7 cm in length carried down through subcutaneous tissues. Several small bleeding points were cauterized. Dissection was carried down through superficial fascia. I initially used a Museum/gallery exhibitions officer but subsequently used Engineer, technical sales and also [place] and dissection was carried deeply into the axilla adjacent to the chest wall and encountered radioactive lymph node which was dissected free from surrounding structures. This by palpation appeared to be about 8 mm in dimension. It was  removed with some surrounding fat and submitted for frozen section. Further scanning with the gamma counter demonstrated a smaller lymph node which was somewhat more cephalad and somewhat farther up into the axilla adjacent to the chest wall. This lymph node was encountered and appeared to be about 5 mm in dimension and was removed with some surrounding fatty tissue and was submitted in formalin. It is noted during the course of the procedure a number of small bleeding points were cauterized. The axilla was palpated. There was no grossly palpable mass remaining within the axilla. The background count was in the range of 0 to 6. It is further noted that the one second count for the first lymph node was 290 to 320 and the one second count for the second lymph node was in the range of 160 to 190. The frozen section was negative for metastatic disease. The wound was inspected. Hemostasis was subsequently intact. The wound was closed with a running 4-0 Monocryl subcuticular suture and Dermabond.   The patient tolerated surgery satisfactorily and is now being prepared for transfer to the recovery room.   ____________________________ Lenna Sciara. Rochel Brome, MD jws:drc D: 09/09/2011 12:10:26 ET T: 09/09/2011 12:29:25 ET JOB#: [id number]  cc: Loreli Dollar, MD, <Dictator> Loreli Dollar MD ELECTRONICALLY SIGNED 09/21/2011 15:18

## 2014-12-10 NOTE — Consult Note (Signed)
Reason for Visit: This 81 year old Female patient presents to the clinic for initial evaluation of  Breast cancer .   Referred by Dr. Tamala Julian.  Diagnosis:   Chief Complaint/Diagnosis   80 year old female with a pathologic stage I (T1 C. N0 M0) ER/PR positive HER-2/neu negative invasive mammary carcinoma of the right breast status post wide local excision and sentinel node biopsy   Pathology Report Pathology report reviewed    Imaging Report Mammograms reviewed    Referral Report Clinical notes reviewed    Planned Treatment Regimen Observation versus whole breast radiation    HPI   patient is a 81 year old female who had regular mammograms. she presents with an abnormal mammogram of the right breast. She underwent a wide local excision positive for invasive mammary carcinoma. Tumor was 1.1 cm ER/PR positive HER-2/neu negative. Margins were clear at 0.5 mm. Tumor was well-differentiated grade 1. 2 sentinel lymph nodes were biopsied both negative for metastatic disease. She has been seen by medical oncology and will be placed on aromatase inhibitor. She is referred to radiation oncology for consideration of treatment today. I have discussed the case with Dr. Tamala Julian should he she is not a MammoSite catheter candidate based on closed lumpectomy site proximity to her skin. She is doing well. She specifically denies breast tenderness cough or bone pain.  Past Hx:    Cancer, Right Breast 2013:    Uterine Cancer:    Gout: 01-May-2009   Lactose intolerant?:    Cancer, Cervical: 1965   Obesity:    Chronic Renal Insufficiency:    Varicella Zoster, Shingles R arm: Apr 2004   GERD:    hypertension:    arthritis:    diabetes:    right total knee: 23-Dec-2010   Left Total Knee Replacement: Nov 2006   partial hysterectomy: 1965   hypercholesterolemia:   Past, Family and Social History:   Past Medical History positive    Cardiovascular hyperlipidemia; hypertension     Gastrointestinal GERD    Genitourinary Chronic renal insufficiency    Endocrine diabetes mellitus    Past Surgical History Hysterectomy, left knee replacement    Past Medical History Comments Shingles right arm, gout, arthritis, morbid obesity    Family History positive    Family History Comments Family history positive for colon cancer, lung cancer and pancreatic cancer    Social History noncontributory    Additional Past Medical and Surgical History Seen by herself today   Allergies:   Colchicine: GI Distress  Ketorolac: Other  Milk: Other  Home Meds:  Home Medications: Medication Instructions Status  Uloric 40 mg oral tablet 1 tab(s) orally once a day (in the morning) Active  glipiZIDE 5 mg oral tablet tab(s) orally once a day (in the morning) Active  Lantus Solostar Pen 100 units/mL subcutaneous solution 35 unit(s) subcutaneous once a day (at bedtime) Active  levothyroxine 50 mcg (0.05 mg) oral tablet 1 tab(s) orally once a day (in the morning) Active  amlodipine 10 mg oral tablet 1 tab(s) orally once a day (in the morning) Active  furosemide 20 mg oral tablet 1 tab(s) orally once a day (in the morning) Active  omeprazole 20 mg oral delayed release capsule 1 cap(s) orally once a day (in the morning) Active  pravastatin 40 mg oral tablet 1 tab(s) orally once a day (at bedtime) Active  aspirin 81 mg oral tablet 1 tab(s) orally once a day (in the morning) Stopped for surgery. Active  metoprolol tartrate 100 mg oral tablet  1 tab(s) orally once a day Active   Review of Systems:   General negative    Performance Status (ECOG) 1    Skin negative    Breast see HPI    Ophthalmologic negative    ENMT negative    Respiratory and Thorax negative    Cardiovascular see HPI    Gastrointestinal see HPI    Genitourinary see HPI    Musculoskeletal negative    Psychiatric negative    Hematology/Lymphatics negative    Endocrine see HPI    Allergic/Immunologic  negative   Nursing Notes:  Nursing Vital Signs and Chemo Nursing Nursing Notes: *CC Vital Signs Flowsheet:   07-Feb-13 13:54   Temp Temperature 97.6   Pulse Pulse 67   Respirations Respirations 20   SBP SBP 161   Pain Scale (0-10)  1   Current Weight (kg) (kg) 141.3   Height (cm) centimeters 167.2   BSA (m2) 2.4   Physical Exam:  General/Skin/HEENT:   General normal    Skin normal    Eyes normal    ENMT normal    Head and Neck normal    Additional PE Well-developed morbidly obese female in NAD. She has a recent wide local excision scar in her right breast in the axillary tail region. Scar is well-healed. Breasts are large and pendulous. No dominant mass or nodularity is noted in either breast into position examined. No axillary or supraclavicular adenopathy is identified. Lungs are clear to A&P cardiac examination shows regular rate and rhythm.   Breasts/Resp/CV/GI/GU:   Respiratory and Thorax normal    Cardiovascular normal    Gastrointestinal normal    Genitourinary normal   MS/Neuro/Psych/Lymph:   Musculoskeletal normal    Neurological normal    Lymphatics normal   Other Results:  Radiology Results: [place]:    10-Dec-12 13:36, US Breast Right   US Breast Right    REASON FOR EXAM:    AV RT PARENCHYMAL DENSITY  COMMENTS:       PROCEDURE: [place]  - US BREAST RIGHT  - Jul 28 2011  1:36PM     RESULT: Targeted ultrasound of the right breast shows a hypoechoic   slightly shadowing irregularly marginated nodule with blood flow in the   11:30 region measuring 0.93 x 0.60 x 0.81 cm. Surgical consultation for   consideration of biopsy or lumpectomy is recommended.    IMPRESSION:      BIRADS:   Category 4-Suspicious Abnormality.      Thank you for the opportunity to contribute to the care of your patient.           Verified By: Sundra Aland, M.D., MD  [facility]:    28-Nov-12 15:51, Digital Screen Mammogram   Digital Screen Mammogram    REASON FOR  EXAM:    screening  COMMENTS:  Submitted by practice: [facility] Scheduled by   user: Chr     PROCEDURE: MAM - MAM DGTL SCREENING MAMMO W/CAD  - Jul 16 2011  3:51PM     RESULT: There is no known family  history of breast cancer. Comparison is   made to analog images of 14 March 2008, and to digital exams dated 14 May 2010, as well as 10 April 2009.  There is an area of   increasing parenchymal density in the upper central right breast midway   between the nipple and pectoralis region. Additional magnification   compression views and ultrasound are recommended.  Scattered,  benign   appearing calcifications are present. There is a predominant fatty   parenchymal replacement.     IMPRESSION:     1.Increasing slightly irregular and lobulated parenchymal density in the   upper central right breast. Further investigation with additional views   and ultrasound is recommended.     BI-RADS: Category 0-Needs Additional Imaging Evaluation      A NEGATIVE MAMMOGRAM REPORT DOES NOT PRECLUDE BIOPSY OR OTHER EVALUATION   OF A CLINICALLY PALPABLE OR OTHERWISE SUSPICIOUS MASS OR LESION. BREAST   CANCER MAY NOT BE DETECTED BY MAMMOGRAPHY IN UP TO 10% OF CASES.       Thank you for the opportunity to contribute to the care of your patient.        Verified By: Sundra Aland, M.D., MD    10-Dec-12 12:59, Digital Additional Views Rt Breast (SCR)   Digital Additional Views Rt Breast (SCR)    REASON FOR EXAM:    AV RT PARENCHYMAL DENSITY  COMMENTS:       PROCEDURE: MAM - MAM DIG ADDVIEWS RT SCR  - Jul 28 2011 12:59PM     RESULT: The patient returned for additional magnification compression   images and shows an area of lobulated somewhat irregularly marginated   nodular density in the upper central right breast with irregular margins.   This area is concerning for malignant disease. Targeted ultrasound shows   a solid mass with blood flow. Surgical consultation for  resection is   recommended.    IMPRESSION:      1. BIRADS:   Category 4-Suspicious Abnormality   2. Findings concerning for malignant disease in the right breast.      A negative mammogram report does not preclude biopsy or other evaluation   of a clinically palpable orotherwise suspicious mass or lesion.  Breast   cancer may not be detected by mammography in up to 10% of cases.     Thank you for the opportunity to contribute to the care of your patient.           Verified By: Sundra Aland, M.D., MD   Assessment and Plan:  Impression:   81 year old female with pathologic stage I invasive mammary carcinoma well-differentiated ER/PR positive HER-2/neu negative status post wide local excision and sentinel node biopsy  Plan:   I have discussed the case personally with Dr. Tamala Julian. Based on the lumpectomy cavity being close to skin surface she is not a candidate for MammoSite catheter placement which would have been ideal. I believe we can observe this patient this time based on the well differentiated nature of her tumor ER/PR positive status and no sentinel node involvement. Based on her other comorbidities she may not have a 5 year life expectancy. I discussed the rationale for treatment with this woman although whole breast radiation will be extremely morbid in her condition with significant skin reaction. Patient agrees to continue close followup care without adjuvant radiation therapy at this time. I have asked to see her back in 3 months for followup. Dr. Tamala Julian agrees with my plan of treatment. As well as Dr. Grayland Ormond. We will start on Femara at this time and prescription was called in. Patient is to call if any problems develop.  I would like to take this opportunity to thank you for allowing me to continue to participate in this patient's care.  CC Referral:   cc: Dr. Tamala Julian, Dr. Sherald Barge   Electronic Signatures: Baruch Gouty, Roda Shutters (MD)  (Signed  07-Feb-13  15:02)  Authored: HPI, Diagnosis, Past Hx, PFSH, Allergies, Home Meds, ROS, Nursing Notes, Physical Exam, Other Results, Encounter Assessment and Plan, CC Referring Physician   Last Updated: 07-Feb-13 15:02 by Armstead Peaks (MD)

## 2015-03-11 DIAGNOSIS — E119 Type 2 diabetes mellitus without complications: Secondary | ICD-10-CM | POA: Insufficient documentation

## 2015-07-26 DIAGNOSIS — R197 Diarrhea, unspecified: Secondary | ICD-10-CM | POA: Insufficient documentation

## 2015-07-26 DIAGNOSIS — R634 Abnormal weight loss: Secondary | ICD-10-CM | POA: Insufficient documentation

## 2015-08-29 ENCOUNTER — Other Ambulatory Visit: Payer: Self-pay | Admitting: Physician Assistant

## 2015-08-29 DIAGNOSIS — Z1239 Encounter for other screening for malignant neoplasm of breast: Secondary | ICD-10-CM

## 2015-08-29 DIAGNOSIS — Z853 Personal history of malignant neoplasm of breast: Secondary | ICD-10-CM

## 2015-08-31 ENCOUNTER — Inpatient Hospital Stay: Payer: Medicare Other

## 2015-08-31 ENCOUNTER — Encounter: Payer: Self-pay | Admitting: *Deleted

## 2015-08-31 ENCOUNTER — Inpatient Hospital Stay
Admission: AD | Admit: 2015-08-31 | Discharge: 2015-09-06 | DRG: 444 | Disposition: A | Discharge status: Home / Self Care | Payer: Medicare Other | Source: Ambulatory Visit | Attending: Internal Medicine | Admitting: Internal Medicine

## 2015-08-31 DIAGNOSIS — R634 Abnormal weight loss: Secondary | ICD-10-CM

## 2015-08-31 DIAGNOSIS — R112 Nausea with vomiting, unspecified: Secondary | ICD-10-CM

## 2015-08-31 DIAGNOSIS — E1122 Type 2 diabetes mellitus with diabetic chronic kidney disease: Secondary | ICD-10-CM | POA: Diagnosis present

## 2015-08-31 DIAGNOSIS — Z794 Long term (current) use of insulin: Secondary | ICD-10-CM | POA: Diagnosis not present

## 2015-08-31 DIAGNOSIS — M1A9XX0 Chronic gout, unspecified, without tophus (tophi): Secondary | ICD-10-CM | POA: Diagnosis present

## 2015-08-31 DIAGNOSIS — N184 Chronic kidney disease, stage 4 (severe): Secondary | ICD-10-CM | POA: Diagnosis present

## 2015-08-31 DIAGNOSIS — N179 Acute kidney failure, unspecified: Secondary | ICD-10-CM | POA: Diagnosis present

## 2015-08-31 DIAGNOSIS — K839 Disease of biliary tract, unspecified: Secondary | ICD-10-CM | POA: Insufficient documentation

## 2015-08-31 DIAGNOSIS — Z801 Family history of malignant neoplasm of trachea, bronchus and lung: Secondary | ICD-10-CM | POA: Diagnosis not present

## 2015-08-31 DIAGNOSIS — Z888 Allergy status to other drugs, medicaments and biological substances status: Secondary | ICD-10-CM

## 2015-08-31 DIAGNOSIS — E43 Unspecified severe protein-calorie malnutrition: Secondary | ICD-10-CM | POA: Insufficient documentation

## 2015-08-31 DIAGNOSIS — I129 Hypertensive chronic kidney disease with stage 1 through stage 4 chronic kidney disease, or unspecified chronic kidney disease: Secondary | ICD-10-CM | POA: Diagnosis present

## 2015-08-31 DIAGNOSIS — K831 Obstruction of bile duct: Secondary | ICD-10-CM | POA: Diagnosis present

## 2015-08-31 DIAGNOSIS — Z7982 Long term (current) use of aspirin: Secondary | ICD-10-CM | POA: Diagnosis not present

## 2015-08-31 DIAGNOSIS — Z853 Personal history of malignant neoplasm of breast: Secondary | ICD-10-CM | POA: Diagnosis not present

## 2015-08-31 DIAGNOSIS — Z800 Family history of malignant neoplasm of digestive organs: Secondary | ICD-10-CM

## 2015-08-31 DIAGNOSIS — Z7984 Long term (current) use of oral hypoglycemic drugs: Secondary | ICD-10-CM | POA: Diagnosis not present

## 2015-08-31 HISTORY — DX: Gastro-esophageal reflux disease without esophagitis: K21.9

## 2015-08-31 HISTORY — DX: Essential (primary) hypertension: I10

## 2015-08-31 HISTORY — DX: Type 2 diabetes mellitus without complications: E11.9

## 2015-08-31 LAB — COMPREHENSIVE METABOLIC PANEL
ALK PHOS: 336 U/L — AB (ref 38–126)
ALT: 306 U/L — ABNORMAL HIGH (ref 14–54)
ANION GAP: 9 (ref 5–15)
AST: 346 U/L — ABNORMAL HIGH (ref 15–41)
Albumin: 3.3 g/dL — ABNORMAL LOW (ref 3.5–5.0)
BUN: 29 mg/dL — ABNORMAL HIGH (ref 6–20)
CALCIUM: 9.1 mg/dL (ref 8.9–10.3)
CO2: 26 mmol/L (ref 22–32)
CREATININE: 2.72 mg/dL — AB (ref 0.44–1.00)
Chloride: 101 mmol/L (ref 101–111)
GFR, EST AFRICAN AMERICAN: 18 mL/min — AB (ref >60)
GFR, EST NON AFRICAN AMERICAN: 15 mL/min — AB (ref >60)
Glucose, Bld: 160 mg/dL — ABNORMAL HIGH (ref 65–99)
Potassium: 4.3 mmol/L (ref 3.5–5.1)
SODIUM: 136 mmol/L (ref 135–145)
Total Bilirubin: 5.1 mg/dL — ABNORMAL HIGH (ref 0.3–1.2)
Total Protein: 6.9 g/dL (ref 6.5–8.1)

## 2015-08-31 LAB — CBC
HCT: 36.4 % (ref 35.0–47.0)
Hemoglobin: 12 g/dL (ref 12.0–16.0)
MCH: 29.6 pg (ref 26.0–34.0)
MCHC: 32.9 g/dL (ref 32.0–36.0)
MCV: 89.9 fL (ref 80.0–100.0)
PLATELETS: 247 10*3/uL (ref 150–440)
RBC: 4.05 MIL/uL (ref 3.80–5.20)
RDW: 14.3 % (ref 11.5–14.5)
WBC: 5.3 10*3/uL (ref 3.6–11.0)

## 2015-08-31 LAB — GLUCOSE, CAPILLARY
GLUCOSE-CAPILLARY: 95 mg/dL (ref 65–99)
Glucose-Capillary: 147 mg/dL — ABNORMAL HIGH (ref 65–99)

## 2015-08-31 LAB — SEDIMENTATION RATE: SED RATE: 56 mm/h — AB (ref 0–30)

## 2015-08-31 LAB — LIPASE, BLOOD: LIPASE: 18 U/L (ref 11–51)

## 2015-08-31 MED ORDER — TRAZODONE HCL 50 MG PO TABS: 25.0000 mg | ORAL_TABLET | Freq: Every evening | ORAL | Status: DC | PRN

## 2015-08-31 MED ORDER — LETROZOLE 2.5 MG PO TABS
2.5000 mg | ORAL_TABLET | Freq: Every day | ORAL | Status: DC
Start: 2015-08-31 — End: 2015-09-06
  Administered 2015-09-01 – 2015-09-06 (×6): 2.5 mg via ORAL
  Filled 2015-08-31 (×7): qty 1

## 2015-08-31 MED ORDER — PANTOPRAZOLE SODIUM 40 MG PO TBEC
40.0000 mg | DELAYED_RELEASE_TABLET | Freq: Every day | ORAL | Status: DC
  Administered 2015-09-01 – 2015-09-06 (×6): 40 mg via ORAL
  Filled 2015-08-31 (×6): qty 1

## 2015-08-31 MED ORDER — LEVOTHYROXINE SODIUM 75 MCG PO TABS
75.0000 ug | ORAL_TABLET | Freq: Every day | ORAL | Status: DC
  Administered 2015-09-01 – 2015-09-06 (×6): 75 ug via ORAL
  Filled 2015-08-31 (×6): qty 1

## 2015-08-31 MED ORDER — AMLODIPINE BESYLATE 10 MG PO TABS
10.0000 mg | ORAL_TABLET | Freq: Every day | ORAL | Status: DC
Start: 2015-08-31 — End: 2015-09-06
  Administered 2015-09-01 – 2015-09-06 (×6): 10 mg via ORAL
  Filled 2015-08-31 (×6): qty 1

## 2015-08-31 MED ORDER — METOPROLOL TARTRATE 50 MG PO TABS
50.0000 mg | ORAL_TABLET | Freq: Every day | ORAL | Status: DC
  Administered 2015-09-01 – 2015-09-06 (×6): 50 mg via ORAL
  Filled 2015-08-31 (×6): qty 1

## 2015-08-31 MED ORDER — INSULIN ASPART 100 UNIT/ML ~~LOC~~ SOLN
0.0000 [IU] | Freq: Three times a day (TID) | SUBCUTANEOUS | Status: DC
  Administered 2015-08-31: 1 [IU] via SUBCUTANEOUS
  Administered 2015-09-01: 2 [IU] via SUBCUTANEOUS
  Administered 2015-09-02 (×2): 1 [IU] via SUBCUTANEOUS
  Administered 2015-09-02: 3 [IU] via SUBCUTANEOUS
  Administered 2015-09-03: 1 [IU] via SUBCUTANEOUS
  Administered 2015-09-03: 2 [IU] via SUBCUTANEOUS
  Administered 2015-09-03: 3 [IU] via SUBCUTANEOUS
  Administered 2015-09-04 (×2): 1 [IU] via SUBCUTANEOUS
  Administered 2015-09-05: 2 [IU] via SUBCUTANEOUS
  Administered 2015-09-05: 1 [IU] via SUBCUTANEOUS
  Administered 2015-09-05: 5 [IU] via SUBCUTANEOUS
  Administered 2015-09-06: 1 [IU] via SUBCUTANEOUS
  Filled 2015-08-31 (×2): qty 1
  Filled 2015-08-31: qty 2
  Filled 2015-08-31: qty 5
  Filled 2015-08-31 (×2): qty 1
  Filled 2015-08-31: qty 2
  Filled 2015-08-31: qty 3
  Filled 2015-08-31 (×4): qty 1
  Filled 2015-08-31: qty 3
  Filled 2015-08-31: qty 2

## 2015-08-31 MED ORDER — ACETAMINOPHEN 650 MG RE SUPP: 650.0000 mg | Freq: Four times a day (QID) | RECTAL | Status: DC | PRN

## 2015-08-31 MED ORDER — ALUM & MAG HYDROXIDE-SIMETH 200-200-20 MG/5ML PO SUSP: 30.0000 mL | Freq: Four times a day (QID) | ORAL | Status: DC | PRN

## 2015-08-31 MED ORDER — GLIPIZIDE 5 MG PO TABS
5.0000 mg | ORAL_TABLET | Freq: Two times a day (BID) | ORAL | Status: DC
  Administered 2015-09-01 – 2015-09-06 (×10): 5 mg via ORAL
  Filled 2015-08-31 (×11): qty 1

## 2015-08-31 MED ORDER — SODIUM CHLORIDE 0.9 % IV SOLN
INTRAVENOUS | Status: DC
  Administered 2015-08-31 – 2015-09-04 (×7): via INTRAVENOUS

## 2015-08-31 MED ORDER — ASPIRIN EC 81 MG PO TBEC
81.0000 mg | DELAYED_RELEASE_TABLET | Freq: Every day | ORAL | Status: DC
  Administered 2015-09-01 – 2015-09-03 (×3): 81 mg via ORAL
  Filled 2015-08-31 (×3): qty 1

## 2015-08-31 MED ORDER — ONDANSETRON HCL 4 MG/2ML IJ SOLN
4.0000 mg | Freq: Four times a day (QID) | INTRAMUSCULAR | Status: DC | PRN
  Administered 2015-09-05: 4 mg via INTRAVENOUS
  Filled 2015-08-31: qty 2

## 2015-08-31 MED ORDER — ONDANSETRON HCL 4 MG PO TABS
4.0000 mg | ORAL_TABLET | Freq: Four times a day (QID) | ORAL | Status: DC | PRN
  Administered 2015-09-04: 4 mg via ORAL
  Filled 2015-08-31: qty 1

## 2015-08-31 MED ORDER — FEBUXOSTAT 40 MG PO TABS
80.0000 mg | ORAL_TABLET | Freq: Every day | ORAL | Status: DC
  Administered 2015-09-01 – 2015-09-03 (×3): 80 mg via ORAL
  Filled 2015-08-31 (×4): qty 2

## 2015-08-31 MED ORDER — IOHEXOL 240 MG/ML SOLN
50.0000 mL | INTRAMUSCULAR | Status: AC
  Administered 2015-08-31 (×2): 50 mL via ORAL

## 2015-08-31 MED ORDER — ACETAMINOPHEN 325 MG PO TABS: 650.0000 mg | ORAL_TABLET | Freq: Four times a day (QID) | ORAL | Status: DC | PRN

## 2015-08-31 NOTE — H&P (Signed)
[name] [place] NAME: Pamela Preston    MR#:  [id number]  DATE OF BIRTH:  1930-07-14  DATE OF ADMISSION:  08/31/2015  PRIMARY CARE PHYSICIAN: [name], MD   REQUESTING/REFERRING PHYSICIAN: Dr. Carrie Mew   CHIEF COMPLAINT:  No chief complaint on file.   HISTORY OF PRESENT ILLNESS:  Pamela Preston  is a 82 y.o. female with a known history of diabetes mellitus, chronic kidney disease stage IV, chronic gout sent in from primary doctor's office secondary to nausea, vomiting, diarrhea, weight loss. Patient found to have elevated LFTs with jaundice with total bilirubin of 4.8 on recent blood work. Patient complains of nausea, vomiting for the past 2 weeks. And weight loss about 40 pounds in the last 6 months. Patient has nausea and not able to eat much.pt has generalized weakness, chest pain. She had a lower abdominal pain but now she denies any pain. Diarrhea about 3-4 loose stools so last week but the butt last 2 days she says diarrhea is decreasing.  PAST MEDICAL HISTORY:  No past medical history on file.  PAST SURGICAL HISTOIRY:  No past surgical history on file. Past medical history significant for hypertension, diabetes mellitus, chronic kidney disease stage IV, chronic gout, history of breast cancer she is on  Femara. SOCIAL HISTORY:   Social History  Substance Use Topics  . Smoking status: Not on file  . Smokeless tobacco: Not on file  . Alcohol Use: Not on file   smoking or drinking, no drugs.  FAMILY HISTORY:  No family history on file. Family history significant for mother had lung cancer . Maternal aunt had the colon cancer Maternal uncle has a pancreatic cancer. DRUG ALLERGIES:   Allergies  Allergen Reactions  . Colchicine   . Ketorolac   . Lipitor [Atorvastatin]     REVIEW OF SYSTEMS:  CONSTITUTIONAL: Generalized weakness, nausea, weight loss.  EYES: No blurred or double vision.  EARS, NOSE, AND  THROAT: No tinnitus or ear pain.  RESPIRATORY: No cough, shortness of breath, wheezing or hemoptysis.  CARDIOVASCULAR: No chest pain, orthopnea, edema.  GASTROINTESTINAL: nausea, vomiting, diarrhea, abdominal pain. GENITOURINARY: No dysuria, hematuria.  ENDOCRINE: No polyuria, nocturia,  HEMATOLOGY: No anemia, easy bruising or bleeding SKIN: No rash or lesion. MUSCULOSKELETAL: No joint pain or arthritis.   NEUROLOGIC: No tingling, numbness, weakness.  PSYCHIATRY: No anxiety or depression.   MEDICATIONS AT HOME:   Prior to Admission medications   Medication Sig Start Date End Date Taking? Authorizing Provider  amLODipine (NORVASC) 10 MG tablet Take 10 mg by mouth daily.    Historical Provider, MD  aspirin 81 MG tablet Take 81 mg by mouth daily.    Historical Provider, MD  febuxostat (ULORIC) 40 MG tablet Take 80 mg by mouth daily.    Historical Provider, MD  furosemide (LASIX) 20 MG tablet Take 20 mg by mouth daily.    Historical Provider, MD  glipiZIDE (GLUCOTROL) 5 MG tablet Take 5 mg by mouth 2 (two) times daily before a meal.    Historical Provider, MD  insulin glargine (LANTUS) 100 UNIT/ML injection Inject 35 Units into the skin at bedtime.    Historical Provider, MD  letrozole (FEMARA) 2.5 MG tablet Take 2.5 mg by mouth daily.    Historical Provider, MD  levothyroxine (SYNTHROID, LEVOTHROID) 75 MCG tablet Take 75 mcg by mouth daily before breakfast.    Historical Provider, MD  lisinopril (PRINIVIL,ZESTRIL) 40 MG tablet Take 40 mg by  mouth daily.    Historical Provider, MD  metaxalone (SKELAXIN) 800 MG tablet Take 800 mg by mouth 3 (three) times daily.    Historical Provider, MD  metoprolol (LOPRESSOR) 50 MG tablet Take 50 mg by mouth daily.    Historical Provider, MD  omeprazole (PRILOSEC) 20 MG capsule Take 20 mg by mouth daily.    Historical Provider, MD  oxyCODONE-acetaminophen (PERCOCET/ROXICET) 5-325 MG per tablet Take 1 tablet by mouth every 4 (four) hours as needed for pain.     Historical Provider, MD  pravastatin (PRAVACHOL) 40 MG tablet Take 40 mg by mouth daily.    Historical Provider, MD      VITAL SIGNS:  Blood pressure 139/45, pulse 59, temperature 98.2 F (36.8 C), temperature source Oral, resp. rate 18, height 5\' 8"  (1.727 m), weight 123.741 kg (272 lb 12.8 oz), SpO2 100 %.  PHYSICAL EXAMINATION:  GENERAL:  82 y.o.-year-old patient lying in the bed with no acute distress.  EYES: Pupils equal, round, reactive to light and accommodation. No scleral icterus. Extraocular muscles intact.  HEENT: Head atraumatic, normocephalic. Oropharynx and nasopharynx clear.  NECK:  Supple, no jugular venous distention. No thyroid enlargement, no tenderness.  LUNGS: Normal breath sounds bilaterally, no wheezing, rales,rhonchi or crepitation. No use of accessory muscles of respiration.  CARDIOVASCULAR: S1, S2 normal. No murmurs, rubs, or gallops.  ABDOMEN: Soft, nontender, nondistended. Bowel sounds present. No organomegaly or mass.  EXTREMITIES: No pedal edema, cyanosis, or clubbing.  NEUROLOGIC: Cranial nerves II through XII are intact. Muscle strength 5/5 in all extremities. Sensation intact. Gait not checked.  PSYCHIATRIC: The patient is alert and oriented x 3.  SKIN: No obvious rash, lesion, or ulcer.   LABORATORY PANEL:   CBC No results for input(s): WBC, HGB, HCT, PLT in the last 168 hours. ------------------------------------------------------------------------------------------------------------------  Chemistries  No results for input(s): NA, K, CL, CO2, GLUCOSE, BUN, CREATININE, CALCIUM, MG, AST, ALT, ALKPHOS, BILITOT in the last 168 hours.  Invalid input(s): GFRCGP ------------------------------------------------------------------------------------------------------------------  Cardiac Enzymes No results for input(s): TROPONINI in the last 168  hours. ------------------------------------------------------------------------------------------------------------------  RADIOLOGY:  No results found.  EKG:   Orders placed or performed in visit on 01/13/13  . EKG 12-Lead    IMPRESSION AND PLAN:   1. Abdominal pain, nausea, vomiting with elevated LFTs with the obstructive jaundice: Patient total bili with 4.8 with ALT 244 and 290 of AST. Patient is going to be admitted to medical service, check abdominal CAT scan without contrast, GI consult is obtained to evaluate for elevated LFTs with obstructive jaundice. #2 history of breast cancer on Femara. Next #3 acute on chronic renal failure with CK disease stage 3: Recent creatinine is 2.9 on blood work today. IV hydration, hold ACE inhibitor as, Lasix today. #3. intractable nausea vomiting likely secondary to GI process: Continue Zofran as needed, continue IV hydration #4 .chronic gout patient is anuric continue that. Diabetes mellitus type 2 patient to the by mouth intake is not good so we will do sliding scale with coverage and use half dose of Lantus. #5. acute diarrhea: According to her diarrhea resolving, had aspirin culture sent at primary doctor's office. #6.. generalized weakness secondary to recent diarrhea and nausea vomiting elevated LFTs and weight loss: Physical therapy consult.  D/w pt and her sister  All the records are reviewed and case discussed with ED provider. Management plans discussed with the patient, family and they are in agreement.  CODE STATUS:full  TOTAL TIME TAKING CARE OF THIS PATIENT: 55 minutes.  Epifanio Lesches M.D on 08/31/2015 at 4:41 PM  Between 7am to 6pm - Pager - [phone]  After 6pm go to [email or web address] - password [email or web address] [facility]  Office  [phone]  CC: Primary care physician; [name], MD  Note: This dictation was prepared with Dragon dictation along with smaller phrase technology. Any  transcriptional errors that result from this process are unintentional.

## 2015-08-31 NOTE — Consult Note (Signed)
GI Inpatient Consult Note  Reason for Consult: Abdominal pain, weight loss, elevated LFT's, diarrhea   Attending Requesting Consult: Dr. Reece Agar  History of Present Illness: Pamela Preston is a 82 y.o. female with a significant pmh of DM, morbid obesity, CKD IV reports that she has been experiencing bilateral lower abdominal cramping for the past 3-4 weeks.  Very severe at first, pain has seemed to ease off.  No explanation for anything that makes it better or worse, not better with bm.  Will wake her up at 2am.  She has also had diarrhea for the past 3-4 weeks with episodes every couple of hours.  They have seemed to resolve, last two days only one stool a day and formed.  Denies blood or change in color.  Elevated LFT's prompted admission.  Reports a loss of appetite over the past 2 months.  Documented weight loss of 24 pounds in last month, reports total of 40 over past year.  She had breast cancer in 2013, no radiation or chemo.  She has significant family history of cancer (all maternal) mom lung, aunt lung (both non-smokers), uncle pancreatic (6's), aunt colon (59's)  Last colonoscopy in 2013, diverticula, otherwise normal colon.   Past Medical History:  No past medical history on file.  Problem List: Patient Active Problem List   Diagnosis Date Noted  . Obstructive jaundice 08/31/2015  . Right hip pain 01/24/2013  . HTN (hypertension) 01/24/2013  . CKD (chronic kidney disease) stage 5, GFR less than 15 ml/min (HCC) 01/24/2013  . Diabetes mellitus type 2 in obese ([place]) 01/24/2013    Past Surgical History: No past surgical history on file.  Allergies: Allergies  Allergen Reactions  . Colchicine   . Ketorolac   . Lipitor [Atorvastatin]     Home Medications: Prescriptions prior to admission  Medication Sig Dispense Refill Last Dose  . amLODipine (NORVASC) 10 MG tablet Take 10 mg by mouth daily.   Taking  . aspirin 81 MG tablet Take 81 mg by mouth daily.   Taking  .  febuxostat (ULORIC) 40 MG tablet Take 80 mg by mouth daily.   Taking  . furosemide (LASIX) 20 MG tablet Take 20 mg by mouth daily.   Taking  . glipiZIDE (GLUCOTROL) 5 MG tablet Take 5 mg by mouth 2 (two) times daily before a meal.   Taking  . insulin glargine (LANTUS) 100 UNIT/ML injection Inject 35 Units into the skin at bedtime.   Taking  . letrozole (FEMARA) 2.5 MG tablet Take 2.5 mg by mouth daily.   Taking  . levothyroxine (SYNTHROID, LEVOTHROID) 75 MCG tablet Take 75 mcg by mouth daily before breakfast.   Taking  . lisinopril (PRINIVIL,ZESTRIL) 40 MG tablet Take 40 mg by mouth daily.   Taking  . metaxalone (SKELAXIN) 800 MG tablet Take 800 mg by mouth 3 (three) times daily.   Taking  . metoprolol (LOPRESSOR) 50 MG tablet Take 50 mg by mouth daily.   Taking  . omeprazole (PRILOSEC) 20 MG capsule Take 20 mg by mouth daily.   Taking  . oxyCODONE-acetaminophen (PERCOCET/ROXICET) 5-325 MG per tablet Take 1 tablet by mouth every 4 (four) hours as needed for pain.   Taking  . pravastatin (PRAVACHOL) 40 MG tablet Take 40 mg by mouth daily.   Taking   Home medication reconciliation was completed with the patient.   Scheduled Inpatient Medications:   . amLODipine  10 mg Oral Daily  . aspirin EC  81 mg Oral Daily  .  febuxostat  80 mg Oral Daily  . glipiZIDE  5 mg Oral BID AC  . insulin aspart  0-9 Units Subcutaneous TID WC  . iohexol  50 mL Oral Q1 Hr x 2  . letrozole  2.5 mg Oral Daily  . [START ON 09/01/2015] levothyroxine  75 mcg Oral QAC breakfast  . metoprolol  50 mg Oral Daily  . pantoprazole  40 mg Oral QAC breakfast    Continuous Inpatient Infusions:   . sodium chloride      PRN Inpatient Medications:  acetaminophen **OR** acetaminophen, alum & mag hydroxide-simeth, ondansetron **OR** ondansetron (ZOFRAN) IV, traZODone  Family History: family history is not on file.    Social History:    The patient denies ETOH, tobacco, or drug use.   Review of Systems: Constitutional:  Weight is stable.  Eyes: No changes in vision. ENT: No oral lesions, sore throat.  GI: see HPI.  Heme/Lymph: No easy bruising.  CV: No chest pain.  GU: No hematuria.  Integumentary: No rashes.  Neuro: No headaches.  Psych: No depression/anxiety.  Endocrine: No heat/cold intolerance.  Allergic/Immunologic: No urticaria.  Resp: No cough, SOB.  Musculoskeletal: No joint swelling.    Physical Examination: BP 139/45 mmHg  Pulse 59  Temp(Src) 98.2 F (36.8 C) (Oral)  Resp 18  Ht 5' 8"  (1.727 m)  Wt 123.741 kg (272 lb 12.8 oz)  BMI 41.49 kg/m2  SpO2 100% Gen: NAD, alert and oriented x 4, sister and niece are present HEENT: scleral icterus noted Neck: supple, no JVD or thyromegaly Chest: CTA bilaterally, no wheezes, crackles, or other adventitious sounds CV: murmur noted Abd: soft, NT,tenderness LUQ, +BS in all four quadrants; no HSM, guarding, ridigity, or rebound tenderness Ext: no edema, well perfused with 2+ pulses, Skin: no rash or lesions noted Lymph: no LAD  Data: Lab Results  Component Value Date   WBC 6.1 11/19/2013   HGB 11.8* 11/19/2013   HCT 35.7 11/19/2013   MCV 88 11/19/2013   PLT 266 11/19/2013   No results for input(s): HGB in the last 168 hours. Lab Results  Component Value Date   NA 138 11/19/2013   K 3.8 11/19/2013   CL 103 11/19/2013   CO2 30 11/19/2013   BUN 18 11/19/2013   CREATININE 2.31* 11/19/2013   Lab Results  Component Value Date   ALT 13 11/19/2013   AST 16 11/19/2013   ALKPHOS 94 11/19/2013   BILITOT 0.4 11/19/2013   No results for input(s): APTT, INR, PTT in the last 168 hours.    Assessment/Plan: Ms. Pamela Preston is a 82 y.o. female with three weeks of abdominal pain and diarrhea, 24 pound documented weight loss in past month, loss of appetite, bili 2 days ago 4.3, 9 days ago wnl; 2 days ago: AST 290, ALT 244, ALK PHOS 336.  9 days ago: AST 84, ALT 93, ALK PHOS 207 CT ab pelvis w/o contrast pending  Recommendations: We recommend  adding a CA19-9, AFP, SED, CRP, pancreatic elastase.   We will continue to follow with you.  Thank you for the consult. Please call with questions or concerns.  Salvadore Farber, PA-C  I personally performed these services.

## 2015-09-01 ENCOUNTER — Inpatient Hospital Stay: Payer: Medicare Other

## 2015-09-01 LAB — BASIC METABOLIC PANEL
Anion gap: 7 (ref 5–15)
BUN: 24 mg/dL — AB (ref 6–20)
CHLORIDE: 107 mmol/L (ref 101–111)
CO2: 26 mmol/L (ref 22–32)
CREATININE: 2.48 mg/dL — AB (ref 0.44–1.00)
Calcium: 8.7 mg/dL — ABNORMAL LOW (ref 8.9–10.3)
GFR calc Af Amer: 20 mL/min — ABNORMAL LOW (ref >60)
GFR calc non Af Amer: 17 mL/min — ABNORMAL LOW (ref >60)
Glucose, Bld: 84 mg/dL (ref 65–99)
POTASSIUM: 4.2 mmol/L (ref 3.5–5.1)
Sodium: 140 mmol/L (ref 135–145)

## 2015-09-01 LAB — CBC
HEMATOCRIT: 33.9 % — AB (ref 35.0–47.0)
Hemoglobin: 11.3 g/dL — ABNORMAL LOW (ref 12.0–16.0)
MCH: 29.7 pg (ref 26.0–34.0)
MCHC: 33.5 g/dL (ref 32.0–36.0)
MCV: 88.8 fL (ref 80.0–100.0)
PLATELETS: 233 10*3/uL (ref 150–440)
RBC: 3.81 MIL/uL (ref 3.80–5.20)
RDW: 14.6 % — AB (ref 11.5–14.5)
WBC: 6.4 10*3/uL (ref 3.6–11.0)

## 2015-09-01 LAB — C-REACTIVE PROTEIN: CRP: 0.9 mg/dL (ref <1.0)

## 2015-09-01 LAB — GLUCOSE, CAPILLARY
GLUCOSE-CAPILLARY: 107 mg/dL — AB (ref 65–99)
GLUCOSE-CAPILLARY: 156 mg/dL — AB (ref 65–99)
GLUCOSE-CAPILLARY: 201 mg/dL — AB (ref 65–99)
Glucose-Capillary: 83 mg/dL (ref 65–99)

## 2015-09-01 MED ORDER — BOOST / RESOURCE BREEZE PO LIQD
1.0000 | Freq: Three times a day (TID) | ORAL | Status: DC
  Administered 2015-09-01 – 2015-09-05 (×11): 1 via ORAL

## 2015-09-01 MED ORDER — PIPERACILLIN-TAZOBACTAM 3.375 G IVPB 30 MIN
3.3750 g | Freq: Three times a day (TID) | INTRAVENOUS | Status: DC
  Administered 2015-09-01 – 2015-09-06 (×13): 3.375 g via INTRAVENOUS
  Filled 2015-09-01 (×17): qty 50

## 2015-09-01 NOTE — Progress Notes (Signed)
PT Cancellation Note  Patient Details Name: Pamela Preston MRN: [id number] DOB: 12/29/1929   Cancelled Treatment:    Reason Eval/Treat Not Completed: Patient at procedure or test/unavailable (will try back as time allows)   Forestine Na, PT 09/01/2015, 11:48 AM

## 2015-09-01 NOTE — Consult Note (Signed)
Patient with abn [place] of abdomen showing dilated CBD.  She likely has a CBD stone causing the LFT's to climb over last few days.  Given the probability of this it would be reasonable to cover her with an antibiotic to lessen the chance of cholangitis developing over the next few days.  Levaquin or Zosyn would be reasonable.

## 2015-09-01 NOTE — Progress Notes (Addendum)
[facility] [place] NAME: Pamela Preston    MR#:  [id number]  DATE OF BIRTH:  06/06/1930  SUBJECTIVE:  Came in with nausea dn vomiting with diarrhea. Found to have elevated LFT's  REVIEW OF SYSTEMS:   Review of Systems  Constitutional: Positive for weight loss. Negative for fever and chills.  HENT: Negative for ear discharge, ear pain and nosebleeds.   Eyes: Negative for blurred vision, pain and discharge.  Respiratory: Negative for sputum production, shortness of breath, wheezing and stridor.   Cardiovascular: Negative for chest pain, palpitations, orthopnea and PND.  Gastrointestinal: Positive for nausea and diarrhea. Negative for vomiting and abdominal pain.  Genitourinary: Negative for urgency and frequency.  Musculoskeletal: Negative for back pain and joint pain.  Neurological: Negative for sensory change, speech change, focal weakness and weakness.  Psychiatric/Behavioral: Negative for depression and hallucinations. The patient is not nervous/anxious.   All other systems reviewed and are negative.  Tolerating Diet:yes CLD Tolerating PT: pending  DRUG ALLERGIES:   Allergies  Allergen Reactions  . Colchicine   . Ketorolac   . Lipitor [Atorvastatin]     VITALS:  Blood pressure 156/54, pulse 68, temperature 98.5 F (36.9 C), temperature source Oral, resp. rate 18, height 5\' 8"  (1.727 m), weight 123.741 kg (272 lb 12.8 oz), SpO2 100 %.  PHYSICAL EXAMINATION:   Physical Exam  GENERAL:  82 y.o.-year-old patient lying in the bed with no acute distress. obese EYES: Pupils equal, round, reactive to light and accommodation. ++ scleral icterus. Extraocular muscles intact.  HEENT: Head atraumatic, normocephalic. Oropharynx and nasopharynx clear.  NECK:  Supple, no jugular venous distention. No thyroid enlargement, no tenderness.  LUNGS: Normal breath sounds bilaterally, no wheezing, rales, rhonchi. No use of accessory muscles  of respiration.  CARDIOVASCULAR: S1, S2 normal. No murmurs, rubs, or gallops.  ABDOMEN: Soft, nontender, nondistended. Bowel sounds present. No organomegaly or mass.  EXTREMITIES: No cyanosis, clubbing or edema b/l.    NEUROLOGIC: Cranial nerves II through XII are intact. No focal Motor or sensory deficits b/l.   PSYCHIATRIC:  patient is alert and oriented x 3.  SKIN: No obvious rash, lesion, or ulcer.   LABORATORY PANEL:  CBC  Recent Labs Lab 09/01/15 0537  WBC 6.4  HGB 11.3*  HCT 33.9*  PLT 233    Chemistries   Recent Labs Lab 08/31/15 1722 09/01/15 0537  NA 136 140  K 4.3 4.2  CL 101 107  CO2 26 26  GLUCOSE 160* 84  BUN 29* 24*  CREATININE 2.72* 2.48*  CALCIUM 9.1 8.7*  AST 346*  --   ALT 306*  --   ALKPHOS 336*  --   BILITOT 5.1*  --     Cardiac Enzymes No results for input(s): TROPONINI in the last 168 hours. RADIOLOGY:  Ct Abdomen Pelvis Wo Contrast  08/31/2015  CLINICAL DATA:  Nausea, vomiting, and diarrhea. Stage IV chronic renal disease. Elevated liver enzymes EXAM: CT ABDOMEN AND PELVIS WITHOUT CONTRAST TECHNIQUE: Multidetector CT imaging of the abdomen and pelvis was performed following the standard protocol without IV contrast. Oral contrast was administered. COMPARISON:  Jan 13, 2013 FINDINGS: Lower chest: Lung bases are clear. There is mitral annulus calcification. Hepatobiliary: No focal liver lesions are identified on this noncontrast enhanced study. The gallbladder appears mildly distended without wall thickening. There is no biliary duct dilatation. Pancreas: No pancreatic mass or inflammatory focus. Spleen: No splenic lesions are identified. Adrenals/Urinary Tract: There is a  1.2 x 0.8 cm left adrenal adenoma. Adrenals otherwise appear unremarkable. Kidneys are small bilaterally. There is a cyst arising from the lateral mid right kidney measuring 2.1 x 1.9 cm. There is a cyst arising from the lateral mid left kidney measuring 1.5 x 1.5 cm. There is a 1.2  x 0.8 cm noncystic masslike area along the posterior mid left kidney, unchanged from prior study. There is an extrarenal pelvis on each side. There is no demonstrable hydronephrosis, however. There is no renal or ureteral calculus on either side. The urinary bladder is midline with wall thickness within normal limits. Stomach/Bowel: There are multiple sigmoid diverticula without diverticulitis. There is no bowel wall or mesenteric thickening. No bowel obstruction. No free air or portal venous air. There is no appreciable bowel pneumatosis. Vascular/Lymphatic: There are atherosclerotic calcifications in the aorta and common iliac arteries. There are foci of calcification at the origins of the superior mesenteric and celiac artery regions. There is more calcification at the origins of these vessels than on the prior study. There is no adenopathy in the abdomen or pelvis. Reproductive: Uterus is absent. There is no pelvic mass or pelvic fluid collection. Other: The appendix appears normal. There is no ascites or abscess in the abdomen or pelvis. There is a minimal ventral hernia containing only fat. Musculoskeletal: There is extensive degenerative change throughout the lower thoracic and lumbar spine. There is marked disc narrowing at L4-5. There is vacuum phenomenon at L2-3, L3-4, and L5-S1. No blastic or lytic bone lesions are appreciable. There is no intramuscular or abdominal wall lesion. IMPRESSION: There is increased calcification at the origins of the superior mesenteric and celiac arteries compared to most recent prior study. No bowel pneumatosis. If there is concern for potential mesenteric ischemia, Doppler ultrasound of the major mesenteric arteries may be a reasonable consideration. No bowel pneumatosis or portal venous air is seen. No bowel wall thickening seen. No abscess. No bowel obstruction. Appendix appears normal. No renal or ureteral calculus. No hydronephrosis. Small non cystic masslike area along  the left kidney is stable compared to prior study and likely is benign given the stability since 2014. Uterus absent.  Rather minimal ventral hernia containing only fat. Electronically Signed   By: Lowella Grip III M.D.   On: 08/31/2015 20:43   ASSESSMENT AND PLAN:  Ayslinn Groshek is a 82 y.o. female with a known history of diabetes mellitus, chronic kidney disease stage IV, chronic gout sent in from primary doctor's office secondary to nausea, vomiting, diarrhea, weight loss. Patient found to have elevated LFTs with jaundice with total bilirubin of 4.8 on recent blood work. Patient complains of nausea, vomiting for the past 2 weeks  1. Abdominal pain, nausea, vomiting with elevated LFTs with the obstructive jaundice: - Patient total bili with 4.8 with ALT 244 and 290 of AST.  -reviewd  abdominal CAT scan without contrast -GI consult is obtained to evaluate for elevated LFTs with obstructive jaundice. -USG abdomen -pt may need MRI abdome (open)  #2 history of breast cancer on Femara. Ne   #3 acute on chronic renal failure with CK disease stage 3: Recent creatinine is 2.9 on blood work today. IV hydration, hold ACE inhibitor and Lasix today.  #3. intractable nausea vomiting likely secondary to GI process: Continue Zofran as needed, continue IV hydration  #4 .chronic gout patient on allopurinol  #5 Diabetes mellitus type 2 patient to the by mouth intake  - sliding scale with coverage and use half dose of Lantus.  #  6.. generalized weakness secondary to recent diarrhea and nausea vomiting elevated LFTs and weight loss:  -Physical therapy consult.  Case discussed with Care Management/Social Worker. Management plans discussed with the patient, family and they are in agreement.  CODE STATUS: full  DVT Prophylaxis: lovneox  TOTAL TIME TAKING CARE OF THIS PATIENT: 30 minutes.  >50% time spent on counselling and coordination of care pt and Dr Tiffany Kocher POSSIBLE D/C IN 12 DAYS, [place].  Note: This dictation was prepared with Dragon dictation along with smaller phrase technology. Any transcriptional errors that result from this process are unintentional.  Keasha Malkiewicz M.D on 09/01/2015 at 11:59 AM  Between 7am to 6pm - Pager - [phone]  After 6pm go to [email or web address] - password [email or web address] [facility]  [facility]  Office  [phone]  CC: Primary care physician; [name], MD

## 2015-09-01 NOTE — Consult Note (Signed)
GI Inpatient Follow-up Note  Patient Identification: Pamela Preston is a 82 y.o. female with elevated LFT's, unexplained weight loss, abdominal pain and diarrhea  Subjective: she reports that she had another episode of diarrhea yesterday, is experiencing weakness. She did add to her history that she had multiple episodes of vomiting prior to the onset of her diarrhea several weeks ago.  She has not had any new meds, nor changes in meds other than change in type of insulin.  Scheduled Inpatient Medications:  . amLODipine  10 mg Oral Daily  . aspirin EC  81 mg Oral Daily  . febuxostat  80 mg Oral Daily  . glipiZIDE  5 mg Oral BID AC  . insulin aspart  0-9 Units Subcutaneous TID WC  . letrozole  2.5 mg Oral Daily  . levothyroxine  75 mcg Oral QAC breakfast  . metoprolol  50 mg Oral Daily  . pantoprazole  40 mg Oral QAC breakfast    Continuous Inpatient Infusions:   . sodium chloride 100 mL/hr at 09/01/15 0618    PRN Inpatient Medications:  acetaminophen **OR** acetaminophen, alum & mag hydroxide-simeth, ondansetron **OR** ondansetron (ZOFRAN) IV, traZODone  Review of Systems: Constitutional: Weight loss.  Eyes: No changes in vision. ENT: No oral lesions, sore throat.  GI: see HPI.  Heme/Lymph: No easy bruising.  CV: No chest pain.  GU: No hematuria.  Integumentary: No rashes.  Neuro: No headaches.  Psych: No depression/anxiety.  Endocrine: No heat/cold intolerance.  Allergic/Immunologic: No urticaria.  Resp: No cough, SOB.  Musculoskeletal: No joint swelling.    Physical Examination: BP 136/52 mmHg  Pulse 73  Temp(Src) 98.5 F (36.9 C) (Oral)  Resp 17  Ht 5\' 8"  (1.727 m)  Wt 123.741 kg (272 lb 12.8 oz)  BMI 41.49 kg/m2  SpO2 100% Gen: NAD, alert and oriented x 4 HEENT: scleral icterus noted Neck: supple, no JVD or thyromegaly Chest: CTA bilaterally, no wheezes, crackles, or other adventitious sounds CV: murmur noted Abd: soft, NT, ND, +BS in all four  quadrants; no HSM, guarding, ridigity, or rebound tenderness Ext: no edema, well perfused with 2+ pulses, Skin: no rash or lesions noted Lymph: no LAD  Data: Lab Results  Component Value Date   WBC 6.4 09/01/2015   HGB 11.3* 09/01/2015   HCT 33.9* 09/01/2015   MCV 88.8 09/01/2015   PLT 233 09/01/2015    Recent Labs Lab 08/31/15 1722 09/01/15 0537  HGB 12.0 11.3*   Lab Results  Component Value Date   NA 140 09/01/2015   K 4.2 09/01/2015   CL 107 09/01/2015   CO2 26 09/01/2015   BUN 24* 09/01/2015   CREATININE 2.48* 09/01/2015   Lab Results  Component Value Date   ALT 306* 08/31/2015   AST 346* 08/31/2015   ALKPHOS 336* 08/31/2015   BILITOT 5.1* 08/31/2015   No results for input(s): APTT, INR, PTT in the last 168 hours. Imaging:  CLINICAL DATA: Obstructive jaundice. 40 pound weight loss in past 4 months. Chronic kidney disease.  EXAM: ABDOMEN ULTRASOUND COMPLETE  COMPARISON: Noncontrast CT on 08/31/2015  FINDINGS: Gallbladder: Gallbladder is distended and contains echogenic sludge, however no distinct gallstones are seen. No evidence of gallbladder wall thickening or pericholecystic fluid. No sonographic Murphy sign noted by sonographer.  Common bile duct: Diameter: Mildly dilated measuring 11 mm. Distal common bile duct not visualized due to bowel gas.  Liver: No focal lesion identified. Within normal limits in parenchymal echogenicity.  IVC: No abnormality visualized.  Pancreas:  Visualized portion unremarkable.  Spleen: Size and appearance within normal limits.  Right Kidney: Length: 10.5 cm. Echogenicity within normal limits. No mass or hydronephrosis visualized. Subcapsular cyst again seen in the midpole measuring 1.9 cm.  Left Kidney: Length: 10.8 cm. Echogenicity within normal limits. No mass or hydronephrosis visualized. Several small renal cysts noted.  Abdominal aorta: No aneurysm visualized.  Other findings:  None.  IMPRESSION: Distended gallbladder containing echogenic sludge. No discrete gallstones or sonographic signs of acute cholecystitis identified.  Biliary ductal dilatation, with common bile duct measuring 11 mm. Distal common bile duct is not visualized. Recommend correlation with liver function tests, and consider abdomen MRI without and with contrast and MRCP for further evaluation.   Electronically Signed  By: Earle Gell M.D.  On: 09/01/2015 13:26  Assessment/Plan: Ms. Seyfried is a 82 y.o. female with LFT's that are trending up.  AST 346, ALT 306, Bili 5.1.  Her lipase was WNL, SED 56.  CRP, AFP, CA19-9 still pending. CT w/o contrast unremarkable, [place] did show dilated CBD 9mm, distended gallbladder with sludge.   Recommendations: Dr. Vira Agar will speak with Dr. Candace Cruise about scheduling ERCP for Monday.  May recommend she be transported to an open MRI for further evaluation.  We recommend checking hep panel, EBV and CMV titers.Please call with questions or concerns.  Salvadore Farber, PA-C  I personally performed these services.

## 2015-09-01 NOTE — Progress Notes (Signed)
Physical Therapy Evaluation Patient Details Name: Pamela Preston MRN: [id number] DOB: 07/13/1930 Today's Date: 09/01/2015   History of Present Illness  Pamela Preston is a 82 y.o. female with a known history of diabetes mellitus, chronic kidney disease stage IV, chronic gout sent in from primary doctor's office secondary to nausea, vomiting, diarrhea, weight loss.  Clinical Impression  Pt presents with generalized weakness from above medical condition and would benefit from acute PT services to address objective findings.  Pt required Min A for supine to sit transfer and CGA for sit<>stand transfer.  Pt able to ambulate with slow steady pace for 60' before onset of fatigue without any frank gait deviations or LOB.     Follow Up Recommendations Home health PT    Equipment Recommendations  Rolling walker with 5" wheels    Recommendations for Other Services       Precautions / Restrictions        Mobility  Bed Mobility Overal bed mobility: Needs Assistance Bed Mobility: Supine to Sit;Sit to Supine     Supine to sit: Min guard Sit to supine: Min guard   General bed mobility comments: Min A to elevate trunk and rotate hips to edge of bed.  Transfers Overall transfer level: Needs assistance Equipment used: Standard walker Transfers: Sit to/from Stand Sit to Stand: Min guard         General transfer comment: Increased time and increased momentum for anterior weight shift; slow to extend into standng  Ambulation/Gait Ambulation/Gait assistance: Min guard Ambulation Distance (Feet): 60 Feet Assistive device: Rolling walker (2 wheeled) Gait Pattern/deviations: Step-through pattern Gait velocity: slow   General Gait Details: Decreased cadence with widened BOS and increased lateral weight shift due to large body mass.  Stairs            Wheelchair Mobility    Modified Rankin (Stroke Patients Only)       Balance Overall balance assessment: Modified Independent                                           Pertinent Vitals/Pain Pain Assessment: No/denies pain    Home Living Family/patient expects to be discharged to:: Private residence Living Arrangements: Alone Available Help at Discharge: Family;Friend(s) Type of Home: House Home Access: Stairs to enter Entrance Stairs-Rails: Left Entrance Stairs-Number of Steps: 3 Home Layout: One level Home Equipment: Walker - 2 wheels;Walker - 4 wheels;Cane - quad;Shower seat;Bedside commode;Grab bars - tub/shower      Prior Function Level of Independence: Independent with assistive device(s)               Hand Dominance        Extremity/Trunk Assessment   Upper Extremity Assessment: Generalized weakness           Lower Extremity Assessment: Generalized weakness         Communication   Communication: No difficulties  Cognition Arousal/Alertness: Awake/alert Behavior During Therapy: WFL for tasks assessed/performed Overall Cognitive Status: Within Functional Limits for tasks assessed                      General Comments      Exercises        Assessment/Plan    PT Assessment Patient needs continued PT services  PT Diagnosis Difficulty walking;Generalized weakness   PT Problem List Decreased strength;Decreased activity tolerance;Decreased mobility;Obesity  PT  Treatment Interventions Gait training;Functional mobility training;Therapeutic activities;Therapeutic exercise;Patient/family education   PT Goals (Current goals can be found in the Care Plan section) Acute Rehab PT Goals Patient Stated Goal: "To go back home." PT Goal Formulation: With patient Time For Goal Achievement: 09/15/15 Potential to Achieve Goals: Good    Frequency Min 2X/week   Barriers to discharge Decreased caregiver support lives alone    Co-evaluation               End of Session Equipment Utilized During Treatment: Gait belt Activity Tolerance: Patient  tolerated treatment well Patient left: in bed;with call bell/phone within reach;with bed alarm set;with family/visitor present Nurse Communication: Mobility status         Time: [id number] PT Time Calculation (min) (ACUTE ONLY): 30 min   Charges:   PT Evaluation $PT Eval Low Complexity: 1 Procedure     PT G Codes:        Arantxa Piercey A Airlie Blumenberg 09/08/15, 4:29 PM

## 2015-09-01 NOTE — Progress Notes (Signed)
Initial Nutrition Assessment  DOCUMENTATION CODES:   Severe malnutrition in context of acute illness/injury  INTERVENTION:  Meals and snacks: Await diet progression per MD Medical Nutrition Supplement Therapy: Recommend boost breeze TID for added nutrition   NUTRITION DIAGNOSIS:   Inadequate oral intake related to altered GI function as evidenced by percent weight loss, meal completion < 50%, per patient/family report.     GOAL:   Patient will meet greater than or equal to 90% of their needs     MONITOR:    (Energy intake, Digestive system)  REASON FOR ASSESSMENT:   Malnutrition Screening Tool    ASSESSMENT:      Pt admitted with nausea, vomiting diarrhea, elevated LFTs  Past Medical History  Diagnosis Date  . Hypertension   . GERD (gastroesophageal reflux disease)   . Diabetes mellitus without complication ([place])     Current Nutrition: tolerating clear liquids  Food/Nutrition-Related History: pt reports eating < 50% of meals in the last month, bites    Scheduled Medications:  . amLODipine  10 mg Oral Daily  . aspirin EC  81 mg Oral Daily  . febuxostat  80 mg Oral Daily  . glipiZIDE  5 mg Oral BID AC  . insulin aspart  0-9 Units Subcutaneous TID WC  . letrozole  2.5 mg Oral Daily  . levothyroxine  75 mcg Oral QAC breakfast  . metoprolol  50 mg Oral Daily  . pantoprazole  40 mg Oral QAC breakfast  . piperacillin-tazobactam  3.375 g Intravenous 3 times per day    Continuous Medications:  . sodium chloride 100 mL/hr at 09/01/15 0618     Electrolyte/Renal Profile and Glucose Profile:   Recent Labs Lab 08/31/15 1722 09/01/15 0537  NA 136 140  K 4.3 4.2  CL 101 107  CO2 26 26  BUN 29* 24*  CREATININE 2.72* 2.48*  CALCIUM 9.1 8.7*  GLUCOSE 160* 84   Protein Profile:   Recent Labs Lab 08/31/15 1722  ALBUMIN 3.3*    Gastrointestinal Profile: Last BM: 1/14   Nutrition-Focused Physical Exam Findings: Nutrition-Focused physical exam  completed. Findings are WDL for fat depletion, muscle depletion, and edema.     Weight Change: 8% wt loss in the last month (24 pound wt loss in the last month)    Diet Order:  Diet clear liquid Room service appropriate?: Yes; Fluid consistency:: Thin  Skin:   reviewed     Height:   Ht Readings from Last 1 Encounters:  08/31/15 5\' 8"  (1.727 m)    Weight:   Wt Readings from Last 1 Encounters:  08/31/15 272 lb 12.8 oz (123.741 kg)    Ideal Body Weight:     BMI:  Body mass index is 41.49 kg/(m^2).  Estimated Nutritional Needs:   Kcal:  BEE 1143 kcals (IF 1.0-1.2, AF 1.3) [id number] kcals/d  Using IBW of 64 kg  Protein:  (1.0-1.2 g/kg) 64-77 g/d  Fluid:  (25-42ml/kg) 1600-1931ml/d  EDUCATION NEEDS:   No education needs identified at this time  HIGH Care Level  Ceferino Lang B. Zenia Resides, [place], [place] (pager) Weekend/On-Call pager [phone])

## 2015-09-02 DIAGNOSIS — E43 Unspecified severe protein-calorie malnutrition: Secondary | ICD-10-CM | POA: Insufficient documentation

## 2015-09-02 LAB — EBV AB TO VIRAL CAPSID AG PNL, IGG+IGM
EBV VCA IgG: >600 U/mL — ABNORMAL HIGH (ref 0.0–17.9)
EBV VCA IgM: <36 U/mL (ref 0.0–35.9)

## 2015-09-02 LAB — GLUCOSE, CAPILLARY
GLUCOSE-CAPILLARY: 145 mg/dL — AB (ref 65–99)
GLUCOSE-CAPILLARY: 235 mg/dL — AB (ref 65–99)
Glucose-Capillary: 124 mg/dL — ABNORMAL HIGH (ref 65–99)
Glucose-Capillary: 187 mg/dL — ABNORMAL HIGH (ref 65–99)

## 2015-09-02 LAB — HEPATITIS PANEL, ACUTE
HCV Ab: 0.1 s/co ratio (ref 0.0–0.9)
Hep A IgM: NEGATIVE
Hep B C IgM: NEGATIVE
Hepatitis B Surface Ag: NEGATIVE

## 2015-09-02 LAB — CMV IGM

## 2015-09-02 MED ORDER — TRAMADOL HCL 50 MG PO TABS
50.0000 mg | ORAL_TABLET | Freq: Two times a day (BID) | ORAL | Status: DC | PRN
  Filled 2015-09-02: qty 1

## 2015-09-02 MED ORDER — TRAMADOL HCL 50 MG PO TABS
50.0000 mg | ORAL_TABLET | Freq: Four times a day (QID) | ORAL | Status: DC | PRN
  Administered 2015-09-02 – 2015-09-04 (×3): 50 mg via ORAL
  Filled 2015-09-02 (×3): qty 1

## 2015-09-02 NOTE — Consult Note (Signed)
Patient with rising LFT's and US showed dilated CBD and distended gall bladder.  Echogenic sludge in gall bladder. Will need ERCP tomorrow with Dr. Candace Cruise.  [place] could not show all of CBD due to body habitus and gas.  Pt on Zosyn to prevent infectious cholangitis which should suffice for pre procedure antibiotics.  Checked orders and not on Lovenox.  Discussed with patient.  Pt is obese and likely will need to be intubated for the procedure.

## 2015-09-02 NOTE — Progress Notes (Signed)
Notified Dr Fritzi Mandes of pt c/o pain and that only pain med ordered was tylenol, which pt declined; Dr order tramadol, 50 mg, PO, q6 hrs, prn for pain; Dr also stated to have pt NPO after midnight tonight

## 2015-09-02 NOTE — Progress Notes (Signed)
[facility] [place] NAME: Pamela Preston    MR#:  [id number]  DATE OF BIRTH:  [birth year]  SUBJECTIVE:  Came in with nausea dn vomiting with diarrhea. Found to have elevated LFT's  REVIEW OF SYSTEMS:   Review of Systems  Constitutional: Positive for weight loss. Negative for fever and chills.  HENT: Negative for ear discharge, ear pain and nosebleeds.   Eyes: Negative for blurred vision, pain and discharge.  Respiratory: Negative for sputum production, shortness of breath, wheezing and stridor.   Cardiovascular: Negative for chest pain, palpitations, orthopnea and PND.  Gastrointestinal: Positive for nausea and diarrhea. Negative for vomiting and abdominal pain.  Genitourinary: Negative for urgency and frequency.  Musculoskeletal: Negative for back pain and joint pain.  Neurological: Negative for sensory change, speech change, focal weakness and weakness.  Psychiatric/Behavioral: Negative for depression and hallucinations. The patient is not nervous/anxious.   All other systems reviewed and are negative.  Tolerating Diet:yes CLD Tolerating PT: home health PT  DRUG ALLERGIES:   Allergies  Allergen Reactions  . Colchicine   . Ketorolac   . Lipitor [Atorvastatin]     VITALS:  Blood pressure 155/58, pulse 65, temperature 98.1 F (36.7 C), temperature source Oral, resp. rate 17, height 5\' 8"  (1.727 m), weight 126.826 kg (279 lb 9.6 oz), SpO2 100 %.  PHYSICAL EXAMINATION:   Physical Exam  GENERAL:  82 y.o.-year-old patient lying in the bed with no acute distress. obese EYES: Pupils equal, round, reactive to light and accommodation. ++ scleral icterus. Extraocular muscles intact.  HEENT: Head atraumatic, normocephalic. Oropharynx and nasopharynx clear.  NECK:  Supple, no jugular venous distention. No thyroid enlargement, no tenderness.  LUNGS: Normal breath sounds bilaterally, no wheezing, rales, rhonchi. No use of accessory  muscles of respiration.  CARDIOVASCULAR: S1, S2 normal. No murmurs, rubs, or gallops.  ABDOMEN: Soft, nontender, nondistended. Bowel sounds present. No organomegaly or mass.  EXTREMITIES: No cyanosis, clubbing or edema b/l.    NEUROLOGIC: Cranial nerves II through XII are intact. No focal Motor or sensory deficits b/l.   PSYCHIATRIC:  patient is alert and oriented x 3.  SKIN: No obvious rash, lesion, or ulcer.   LABORATORY PANEL:  CBC  Recent Labs Lab 09/01/15 0537  WBC 6.4  HGB 11.3*  HCT 33.9*  PLT 233    Chemistries   Recent Labs Lab 08/31/15 1722 09/01/15 0537  NA 136 140  K 4.3 4.2  CL 101 107  CO2 26 26  GLUCOSE 160* 84  BUN 29* 24*  CREATININE 2.72* 2.48*  CALCIUM 9.1 8.7*  AST 346*  --   ALT 306*  --   ALKPHOS 336*  --   BILITOT 5.1*  --     Cardiac Enzymes No results for input(s): TROPONINI in the last 168 hours. RADIOLOGY:  Ct Abdomen Pelvis Wo Contrast  08/31/2015  CLINICAL DATA:  Nausea, vomiting, and diarrhea. Stage IV chronic renal disease. Elevated liver enzymes EXAM: CT ABDOMEN AND PELVIS WITHOUT CONTRAST TECHNIQUE: Multidetector CT imaging of the abdomen and pelvis was performed following the standard protocol without IV contrast. Oral contrast was administered. COMPARISON:  Jan 13, 2013 FINDINGS: Lower chest: Lung bases are clear. There is mitral annulus calcification. Hepatobiliary: No focal liver lesions are identified on this noncontrast enhanced study. The gallbladder appears mildly distended without wall thickening. There is no biliary duct dilatation. Pancreas: No pancreatic mass or inflammatory focus. Spleen: No splenic lesions are identified. Adrenals/Urinary Tract: There  is a 1.2 x 0.8 cm left adrenal adenoma. Adrenals otherwise appear unremarkable. Kidneys are small bilaterally. There is a cyst arising from the lateral mid right kidney measuring 2.1 x 1.9 cm. There is a cyst arising from the lateral mid left kidney measuring 1.5 x 1.5 cm. There  is a 1.2 x 0.8 cm noncystic masslike area along the posterior mid left kidney, unchanged from prior study. There is an extrarenal pelvis on each side. There is no demonstrable hydronephrosis, however. There is no renal or ureteral calculus on either side. The urinary bladder is midline with wall thickness within normal limits. Stomach/Bowel: There are multiple sigmoid diverticula without diverticulitis. There is no bowel wall or mesenteric thickening. No bowel obstruction. No free air or portal venous air. There is no appreciable bowel pneumatosis. Vascular/Lymphatic: There are atherosclerotic calcifications in the aorta and common iliac arteries. There are foci of calcification at the origins of the superior mesenteric and celiac artery regions. There is more calcification at the origins of these vessels than on the prior study. There is no adenopathy in the abdomen or pelvis. Reproductive: Uterus is absent. There is no pelvic mass or pelvic fluid collection. Other: The appendix appears normal. There is no ascites or abscess in the abdomen or pelvis. There is a minimal ventral hernia containing only fat. Musculoskeletal: There is extensive degenerative change throughout the lower thoracic and lumbar spine. There is marked disc narrowing at L4-5. There is vacuum phenomenon at L2-3, L3-4, and L5-S1. No blastic or lytic bone lesions are appreciable. There is no intramuscular or abdominal wall lesion. IMPRESSION: There is increased calcification at the origins of the superior mesenteric and celiac arteries compared to most recent prior study. No bowel pneumatosis. If there is concern for potential mesenteric ischemia, Doppler ultrasound of the major mesenteric arteries may be a reasonable consideration. No bowel pneumatosis or portal venous air is seen. No bowel wall thickening seen. No abscess. No bowel obstruction. Appendix appears normal. No renal or ureteral calculus. No hydronephrosis. Small non cystic masslike  area along the left kidney is stable compared to prior study and likely is benign given the stability since 2014. Uterus absent.  Rather minimal ventral hernia containing only fat. Electronically Signed   By: Lowella Grip III M.D.   On: 08/31/2015 20:43   US Abdomen Complete  09/01/2015  CLINICAL DATA:  Obstructive jaundice. 40 pound weight loss in past 4 months. Chronic kidney disease. EXAM: ABDOMEN ULTRASOUND COMPLETE COMPARISON:  Noncontrast CT on 08/31/2015 FINDINGS: Gallbladder: Gallbladder is distended and contains echogenic sludge, however no distinct gallstones are seen. No evidence of gallbladder wall thickening or pericholecystic fluid. No sonographic Murphy sign noted by sonographer. Common bile duct: Diameter: Mildly dilated measuring 11 mm. Distal common bile duct not visualized due to bowel gas. Liver: No focal lesion identified. Within normal limits in parenchymal echogenicity. IVC: No abnormality visualized. Pancreas: Visualized portion unremarkable. Spleen: Size and appearance within normal limits. Right Kidney: Length: 10.5 cm. Echogenicity within normal limits. No mass or hydronephrosis visualized. Subcapsular cyst again seen in the midpole measuring 1.9 cm. Left Kidney: Length: 10.8 cm. Echogenicity within normal limits. No mass or hydronephrosis visualized. Several small renal cysts noted. Abdominal aorta: No aneurysm visualized. Other findings: None. IMPRESSION: Distended gallbladder containing echogenic sludge. No discrete gallstones or sonographic signs of acute cholecystitis identified. Biliary ductal dilatation, with common bile duct measuring 11 mm. Distal common bile duct is not visualized. Recommend correlation with liver function tests, and consider abdomen MRI without  and with contrast and MRCP for further evaluation. Electronically Signed   By: Earle Gell M.D.   On: 09/01/2015 13:26   ASSESSMENT AND PLAN:  Pamela Preston is a 82 y.o. female with a known history of diabetes  mellitus, chronic kidney disease stage IV, chronic gout sent in from primary doctor's office secondary to nausea, vomiting, diarrhea, weight loss. Patient found to have elevated LFTs with jaundice with total bilirubin of 4.8 on recent blood work. Patient complains of nausea, vomiting for the past 2 weeks  1. Abdominal pain, nausea, vomiting with elevated LFTs with the obstructive jaundice: - Patient total bili with 4.8 with ALT 244 and 290 of AST.  -reviewd  abdominal CAT scan without contrast -GI consult is obtained to evaluate for elevated LFTs with obstructive jaundice. -USG abdomen showed dialted CBD Stone vs mass -pt ill get ERCP in am -empiric IV zosyn  #2 history of breast cancer on Femara. Ne   #3 acute on chronic renal failure with CK disease stage 3: Recent creatinine is 2.9 on blood work today. IV hydration, hold ACE inhibitor and Lasix today.  #3. intractable nausea vomiting likely secondary to GI process: Continue Zofran as needed, continue IV hydration  #4 .chronic gout patient on allopurinol  #5 Diabetes mellitus type 2 patient to the by mouth intake  - sliding scale with coverage and use half dose of Lantus.  #6.. generalized weakness  -Physical therapy consult noted rec HHPT  Case discussed with Care Management/Social Worker. Management plans discussed with the patient, family and they are in agreement.  CODE STATUS: full  DVT Prophylaxis: lovneox  TOTAL TIME TAKING CARE OF THIS PATIENT: 30 minutes.  >50% time spent on counselling and coordination of care pt and Dr Tiffany Kocher POSSIBLE D/C IN 12 DAYS, [place].  Note: This dictation was prepared with Dragon dictation along with smaller phrase technology. Any transcriptional errors that result from this process are unintentional.  Pamela Preston M.D on 09/02/2015 at 2:15 PM  Between 7am to 6pm - Pager - [phone]  After 6pm go to [email or web address] - password [email or web address] [facility]  [facility]   Office  [phone]  CC: Primary care physician; [name], MD

## 2015-09-03 ENCOUNTER — Encounter
Admission: AD | Disposition: A | Discharge status: Home / Self Care | Payer: Self-pay | Source: Ambulatory Visit | Attending: Internal Medicine

## 2015-09-03 LAB — GLUCOSE, CAPILLARY
GLUCOSE-CAPILLARY: 122 mg/dL — AB (ref 65–99)
GLUCOSE-CAPILLARY: 196 mg/dL — AB (ref 65–99)
GLUCOSE-CAPILLARY: 219 mg/dL — AB (ref 65–99)
GLUCOSE-CAPILLARY: 156 mg/dL — AB (ref 65–99)

## 2015-09-03 LAB — AFP TUMOR MARKER: AFP-Tumor Marker: 4.6 ng/mL (ref 0.0–8.3)

## 2015-09-03 LAB — CA 19-9 (SERIAL): CA 19 9: 5 U/mL (ref 0–35)

## 2015-09-03 SURGERY — ERCP, WITH INTERVENTION IF INDICATED
Anesthesia: General

## 2015-09-03 MED ORDER — SODIUM CHLORIDE 0.9 % IV SOLN: INTRAVENOUS | Status: DC

## 2015-09-03 MED ORDER — INDOMETHACIN 50 MG RE SUPP
100.0000 mg | Freq: Once | RECTAL | Status: DC
  Filled 2015-09-03 (×2): qty 2

## 2015-09-03 NOTE — Consult Note (Signed)
GI Inpatient Follow-up Note  Patient Identification: Pamela Preston is a 82 y.o. female with elevated LFT's, unexplained weight loss, abdominal pain and diarrhea  Subjective: She reports she is feeling much better today, she is not experiencing any abdominal pain.  She had a soft bm yesterday.    She is scheduled for an ERCP tomorrow, does not have any questions about the procedure.  Scheduled Inpatient Medications:  . amLODipine  10 mg Oral Daily  . aspirin EC  81 mg Oral Daily  . febuxostat  80 mg Oral Daily  . feeding supplement  1 Container Oral TID BM  . glipiZIDE  5 mg Oral BID AC  . indomethacin  100 mg Rectal Once  . insulin aspart  0-9 Units Subcutaneous TID WC  . letrozole  2.5 mg Oral Daily  . levothyroxine  75 mcg Oral QAC breakfast  . metoprolol  50 mg Oral Daily  . pantoprazole  40 mg Oral QAC breakfast  . piperacillin-tazobactam  3.375 g Intravenous 3 times per day    Continuous Inpatient Infusions:   . sodium chloride 50 mL/hr at 09/02/15 1657  . sodium chloride      PRN Inpatient Medications:  acetaminophen **OR** acetaminophen, alum & mag hydroxide-simeth, ondansetron **OR** ondansetron (ZOFRAN) IV, traMADol, traZODone  Review of Systems: Constitutional: Weight loss.  Eyes: No changes in vision. ENT: No oral lesions, sore throat.  GI: see HPI.  Heme/Lymph: No easy bruising.  CV: No chest pain.  GU: No hematuria.  Integumentary: No rashes.  Neuro: No headaches.  Psych: No depression/anxiety.  Endocrine: No heat/cold intolerance.  Allergic/Immunologic: No urticaria.  Resp: No cough, SOB.  Musculoskeletal: No joint swelling.    Physical Examination: BP 141/75 mmHg  Pulse 57  Temp(Src) 97.6 F (36.4 C) (Oral)  Resp 16  Ht 5\' 8"  (1.727 m)  Wt 126.508 kg (278 lb 14.4 oz)  BMI 42.42 kg/m2  SpO2 100% Gen: NAD, alert and oriented x 4 HEENT: scleral icterus noted Neck: supple, no JVD or thyromegaly Chest: CTA bilaterally, no wheezes, crackles, or  other adventitious sounds CV: murmur noted Abd: soft, NT, ND, +BS in all four quadrants; no HSM, guarding, ridigity, or rebound tenderness Ext: no edema, well perfused with 2+ pulses, Skin: no rash or lesions noted Lymph: no LAD  Data: Lab Results  Component Value Date   WBC 6.4 09/01/2015   HGB 11.3* 09/01/2015   HCT 33.9* 09/01/2015   MCV 88.8 09/01/2015   PLT 233 09/01/2015    Recent Labs Lab 08/31/15 1722 09/01/15 0537  HGB 12.0 11.3*   Lab Results  Component Value Date   NA 140 09/01/2015   K 4.2 09/01/2015   CL 107 09/01/2015   CO2 26 09/01/2015   BUN 24* 09/01/2015   CREATININE 2.48* 09/01/2015   Lab Results  Component Value Date   ALT 306* 08/31/2015   AST 346* 08/31/2015   ALKPHOS 336* 08/31/2015   BILITOT 5.1* 08/31/2015   No results for input(s): APTT, INR, PTT in the last 168 hours. Assessment/Plan: Ms. Boakye is a 82 y.o. female with LFT's that are trending up. AST 346, ALT 306, Bili 5.1. Her lipase was WNL, SED 56. CRP, AFP, CA19-9 still pending. CMV negative, EBV IGG positive, IGM negative, neg hep panel, CRP wnl  CT w/o contrast unremarkable, [place] did show dilated CBD 51mm, distended gallbladder with sludge.  No new labs today   Recommendations: ERCP will be performed tommorrow.  Continue with low fat diet.  We will continue to follow with you. Please call with questions or concerns.  Salvadore Farber, PA-C  I personally performed these services.

## 2015-09-03 NOTE — Care Management Important Message (Signed)
Important Message  Patient Details  Name: Pamela Preston MRN: [id number] Date of Birth: Apr 26, 1930   Medicare Important Message Given:  Yes    [name] A Gerad Cornelio 09/03/2015, 10:53 AM

## 2015-09-03 NOTE — Care Management (Signed)
Patient admitted from home with Abdominal pain, nausea, and vomiting.  Patient lives at home alone.  Patient obtains her medications from mail scripts, and [facility] on [address].  Patient has a sister, niece, and friend who live locally.  They provide support, transportation, and help with needs such as obtaining groceries and running errands.  Patient states that she has a walker, rollator, cane, lift chair, and chair in the home.  Patient states that she has a stay at [facility] 2 years ago, followed up with home health. Patient and family believe that the agency that was used was [facility].  States that they were happy with their service and would like to used them again if indicated at time of discharge.  PT is recommending home health PT.  Patient and family have concern about the patient returning home, and would like to speak with CSW regarding private paying for placement.  RNCM following for discharge planing.

## 2015-09-03 NOTE — Progress Notes (Signed)
Physical Therapy Treatment Patient Details Name: Pamela Preston MRN: [id number] DOB: January 19, 1930 Today's Date: 09/17/15    History of Present Illness Pamela Preston is a 82 y.o. female with a known history of diabetes mellitus, chronic kidney disease stage IV, chronic gout sent in from primary doctor's office secondary to nausea, vomiting, diarrhea, weight loss.    PT Comments    Pt with good tolerance to today's session, demonstrated mod I (HOB elevated) for bed mobility and good safety with transfers.  Pt ambulated approx 133ft with RW with slow cadence but with good pacing and no LOB with activity.    Follow Up Recommendations  Home health PT     Equipment Recommendations  Rolling walker with 5" wheels    Recommendations for Other Services       Precautions / Restrictions      Mobility  Bed Mobility Overal bed mobility: Modified Independent Bed Mobility: Supine to Sit     Supine to sit: Min guard     General bed mobility comments: Bed mobility done with HOB elevated   Transfers Overall transfer level: Needs assistance Equipment used: Rolling walker (2 wheeled) Transfers: Sit to/from Stand Sit to Stand: Min guard         General transfer comment: increased time  Ambulation/Gait Ambulation/Gait assistance: Min guard Ambulation Distance (Feet): 100 Feet Assistive device: Rolling walker (2 wheeled) Gait Pattern/deviations: Step-through pattern     General Gait Details: decreased cadence, no LOB   Stairs            Wheelchair Mobility    Modified Rankin (Stroke Patients Only)       Balance Overall balance assessment: Modified Independent                                  Cognition Arousal/Alertness: Awake/alert Behavior During Therapy: WFL for tasks assessed/performed Overall Cognitive Status: Within Functional Limits for tasks assessed                      Exercises      General Comments        Pertinent  Vitals/Pain Pain Assessment: No/denies pain    Home Living                      Prior Function            PT Goals (current goals can now be found in the care plan section) Acute Rehab PT Goals Patient Stated Goal: "To go back home." PT Goal Formulation: With patient Time For Goal Achievement: 09/15/15 Potential to Achieve Goals: Good    Frequency  Min 2X/week    PT Plan Current plan remains appropriate    Co-evaluation             End of Session Equipment Utilized During Treatment: Gait belt Activity Tolerance: Patient tolerated treatment well Patient left: in chair;with call bell/phone within reach;with chair alarm set     Time: 1210-1222 PT Time Calculation (min) (ACUTE ONLY): 12 min  Charges:  $Therapeutic Activity: 8-22 mins                    G Codes:      Petrita Blunck 2015-09-17, 3:37 PM  Jahred Tatar, PTA

## 2015-09-03 NOTE — Progress Notes (Signed)
Notified by Dr. Vira Agar that ERCP for today would be canceled and rescheduled for tomorrow. Per Dr. Vira Agar place order for patient to be on low-fat diet

## 2015-09-03 NOTE — Progress Notes (Signed)
[place] [facility] [place] NAME: Pamela Preston    MR#:  [id number]  DATE OF BIRTH:  09-Jun-1930  SUBJECTIVE:  Came in with nausea dn vomiting with diarrhea. Found to have elevated LFT's No complaints today  REVIEW OF SYSTEMS:   Review of Systems  Constitutional: Positive for weight loss. Negative for fever and chills.  HENT: Negative for ear discharge, ear pain and nosebleeds.   Eyes: Negative for blurred vision, pain and discharge.  Respiratory: Negative for sputum production, shortness of breath, wheezing and stridor.   Cardiovascular: Negative for chest pain, palpitations, orthopnea and PND.  Gastrointestinal: Positive for nausea and diarrhea. Negative for vomiting and abdominal pain.  Genitourinary: Negative for urgency and frequency.  Musculoskeletal: Negative for back pain and joint pain.  Neurological: Negative for sensory change, speech change, focal weakness and weakness.  Psychiatric/Behavioral: Negative for depression and hallucinations. The patient is not nervous/anxious.   All other systems reviewed and are negative.  Tolerating Diet:yes Tolerating PT: home health PT  DRUG ALLERGIES:   Allergies  Allergen Reactions  . Colchicine   . Ketorolac   . Lipitor [Atorvastatin]     VITALS:  Blood pressure 141/75, pulse 57, temperature 97.6 F (36.4 C), temperature source Oral, resp. rate 16, height 5\' 8"  (1.727 m), weight 126.508 kg (278 lb 14.4 oz), SpO2 100 %.  PHYSICAL EXAMINATION:   Physical Exam  GENERAL:  82 y.o.-year-old patient lying in the bed with no acute distress. obese EYES: Pupils equal, round, reactive to light and accommodation. ++ scleral icterus. Extraocular muscles intact.  HEENT: Head atraumatic, normocephalic. Oropharynx and nasopharynx clear.  NECK:  Supple, no jugular venous distention. No thyroid enlargement, no tenderness.  LUNGS: Normal breath sounds bilaterally, no wheezing, rales, rhonchi. No  use of accessory muscles of respiration.  CARDIOVASCULAR: S1, S2 normal. No murmurs, rubs, or gallops.  ABDOMEN: Soft, nontender, nondistended. Bowel sounds present. No organomegaly or mass.  EXTREMITIES: No cyanosis, clubbing or edema b/l.    NEUROLOGIC: Cranial nerves II through XII are intact. No focal Motor or sensory deficits b/l.   PSYCHIATRIC:  patient is alert and oriented x 3.  SKIN: No obvious rash, lesion, or ulcer.   LABORATORY PANEL:  CBC  Recent Labs Lab 09/01/15 0537  WBC 6.4  HGB 11.3*  HCT 33.9*  PLT 233    Chemistries   Recent Labs Lab 08/31/15 1722 09/01/15 0537  NA 136 140  K 4.3 4.2  CL 101 107  CO2 26 26  GLUCOSE 160* 84  BUN 29* 24*  CREATININE 2.72* 2.48*  CALCIUM 9.1 8.7*  AST 346*  --   ALT 306*  --   ALKPHOS 336*  --   BILITOT 5.1*  --     Cardiac Enzymes No results for input(s): TROPONINI in the last 168 hours. RADIOLOGY:  No results found. ASSESSMENT AND PLAN:  Pamela Preston is a 82 y.o. female with a known history of diabetes mellitus, chronic kidney disease stage IV, chronic gout sent in from primary doctor's office secondary to nausea, vomiting, diarrhea, weight loss. Patient found to have elevated LFTs with jaundice with total bilirubin of 4.8 on recent blood work. Patient complains of nausea, vomiting for the past 2 weeks  1. Abdominal pain, nausea, vomiting with elevated LFTs with the obstructive jaundice: - Patient total bili with 4.8 with ALT 244 and 290 of AST.  -reviewd  abdominal CAT scan without contrast -GI consult is obtained to evaluate for  elevated LFTs with obstructive jaundice. -USG abdomen showed dilated CBD Stone vs mass -pt ill get ERCP in am -empiric IV zosyn  #2 history of breast cancer on Femara. Ne   #3 acute on chronic renal failure with CK disease stage 3: Recent creatinine is 2.9 on blood work today. IV hydration, hold ACE inhibitor and Lasix today.  #3. intractable nausea vomiting likely secondary to  GI process: Continue Zofran as needed, continue IV hydration  #4 .chronic gout patient on allopurinol  #5 Diabetes mellitus type 2 patient to the by mouth intake  - sliding scale with coverage and use half dose of Lantus.  #6.. generalized weakness  -Physical therapy consult noted rec HHPT  Case discussed with Care Management/Social Worker. Management plans discussed with the patient, family and they are in agreement.  CODE STATUS: full  DVT Prophylaxis: lovneox  TOTAL TIME TAKING CARE OF THIS PATIENT: 30 minutes.  >50% time spent on counselling and coordination of care pt and Dr Tiffany Kocher POSSIBLE D/C IN 12 DAYS, [place].  Note: This dictation was prepared with Dragon dictation along with smaller phrase technology. Any transcriptional errors that result from this process are unintentional.  Jasir Rother M.D on 09/03/2015 at 5:52 PM  Between 7am to 6pm - Pager - [phone]  After 6pm go to [email or web address] - password [email or web address] [facility]  [facility]  Office  [phone]  CC: Primary care physician; [name], MD

## 2015-09-04 ENCOUNTER — Encounter: Payer: Self-pay | Admitting: Anesthesiology

## 2015-09-04 ENCOUNTER — Encounter
Admission: AD | Disposition: A | Discharge status: Home / Self Care | Payer: Self-pay | Source: Ambulatory Visit | Attending: Internal Medicine

## 2015-09-04 ENCOUNTER — Inpatient Hospital Stay: Payer: Medicare Other | Admitting: Anesthesiology

## 2015-09-04 ENCOUNTER — Inpatient Hospital Stay: Payer: Medicare Other

## 2015-09-04 HISTORY — PX: ERCP: SHX5425

## 2015-09-04 LAB — GLUCOSE, CAPILLARY
GLUCOSE-CAPILLARY: 143 mg/dL — AB (ref 65–99)
GLUCOSE-CAPILLARY: 129 mg/dL — AB (ref 65–99)
Glucose-Capillary: 145 mg/dL — ABNORMAL HIGH (ref 65–99)
Glucose-Capillary: 168 mg/dL — ABNORMAL HIGH (ref 65–99)

## 2015-09-04 SURGERY — ERCP, WITH INTERVENTION IF INDICATED
Anesthesia: General

## 2015-09-04 SURGERY — ERCP, WITH INTERVENTION IF INDICATED
Anesthesia: General | Laterality: Left

## 2015-09-04 MED ORDER — PROPOFOL 10 MG/ML IV BOLUS
INTRAVENOUS | Status: DC | PRN
  Administered 2015-09-04: 25 mg via INTRAVENOUS
  Administered 2015-09-04 (×5): 40 mg via INTRAVENOUS

## 2015-09-04 MED ORDER — FENTANYL CITRATE (PF) 100 MCG/2ML IJ SOLN
INTRAMUSCULAR | Status: AC
  Administered 2015-09-04: 25 ug via INTRAVENOUS
  Filled 2015-09-04: qty 2

## 2015-09-04 MED ORDER — FENTANYL CITRATE (PF) 100 MCG/2ML IJ SOLN
INTRAMUSCULAR | Status: DC | PRN
  Administered 2015-09-04: 25 ug via INTRAVENOUS
  Administered 2015-09-04: 50 ug via INTRAVENOUS

## 2015-09-04 MED ORDER — PROPOFOL 500 MG/50ML IV EMUL
INTRAVENOUS | Status: DC | PRN
  Administered 2015-09-04: 150 ug/kg/min via INTRAVENOUS

## 2015-09-04 MED ORDER — SODIUM CHLORIDE 0.9 % IV SOLN
INTRAVENOUS | Status: DC
  Administered 2015-09-04 (×2): via INTRAVENOUS

## 2015-09-04 MED ORDER — ONDANSETRON HCL 4 MG/2ML IJ SOLN
INTRAMUSCULAR | Status: AC
  Administered 2015-09-04: 4 mg
  Filled 2015-09-04: qty 2

## 2015-09-04 MED ORDER — PROPOFOL 500 MG/50ML IV EMUL
INTRAVENOUS | Status: DC | PRN
Start: 2015-09-04 — End: 2015-09-04
  Administered 2015-09-04: 150 ug/kg/min via INTRAVENOUS

## 2015-09-04 MED ORDER — FENTANYL CITRATE (PF) 100 MCG/2ML IJ SOLN
25.0000 ug | INTRAMUSCULAR | Status: AC | PRN
  Administered 2015-09-04 (×2): 25 ug via INTRAVENOUS

## 2015-09-04 MED ORDER — EPHEDRINE SULFATE 50 MG/ML IJ SOLN
INTRAMUSCULAR | Status: DC | PRN
  Administered 2015-09-04: 10 mg via INTRAVENOUS

## 2015-09-04 MED ORDER — ONDANSETRON HCL 4 MG/2ML IJ SOLN: 4.0000 mg | Freq: Once | INTRAMUSCULAR | Status: DC

## 2015-09-04 MED ORDER — MIDAZOLAM HCL 2 MG/2ML IJ SOLN
INTRAMUSCULAR | Status: DC | PRN
  Administered 2015-09-04 (×2): 0.5 mg via INTRAVENOUS

## 2015-09-04 MED ORDER — INDOMETHACIN 50 MG RE SUPP
100.0000 mg | Freq: Once | RECTAL | Status: AC
  Administered 2015-09-04: 100 mg via RECTAL
  Filled 2015-09-04: qty 2

## 2015-09-04 NOTE — Clinical Social Work Note (Signed)
Clinical Social Work Assessment  Patient Details  Name: Pamela Preston MRN: [id number] Date of Birth: 07/27/1930  Date of referral:  09/04/15               Reason for consult:  Facility Placement                Permission sought to share information with:    Permission granted to share information::     Name::        Agency::     Relationship::     Contact Information:     Housing/Transportation Living arrangements for the past 2 months:  Single Family Home Source of Information:  Patient Patient Interpreter Needed:  None Criminal Activity/Legal Involvement Pertinent to Current Situation/Hospitalization:  No - Comment as needed Significant Relationships:  Siblings Lives with:    Do you feel safe going back to the place where you live?  Yes Need for family participation in patient care:  No (Coment)  Care giving concerns:  Patient resides at home alone.   Social Worker assessment / plan:  CSW was informed by RN CM that patient's family had requested rehab placement even though PT had assessed patient and recommended home with home health. RN CM stated family had stated that patient would pay out of pocket. CSW met with patient this morning and discussed the request by her family with her. CSW explained insurance guidelines and that she would need to be prepared to pay out of pocket to go to rehab this time due to PT recommendation of home health. Patient was very polite and pleasant and stated that she would return home and that she did not want to pay out of pocket. Cost was briefly discussed with patient and she was not interested. Patient stated her sister might be upset with her but she is going to return home. RN CM to arrange home health.  Employment status:  Retired Insurance information:  Managed Medicare PT Recommendations:  Home with Home Health Information / Referral to community resources:     Patient/Family's Response to care:  Patient expressed appreciation for CSW  assistance.  Patient/Family's Understanding of and Emotional Response to Diagnosis, Current Treatment, and Prognosis:  Patient is not in agreement with her family for her to pay out of pocket for STR at this time and she has decided to return home.   Emotional Assessment Appearance:  Appears stated age Attitude/Demeanor/Rapport:   (pleasant and cooperative) Affect (typically observed):  Accepting, Adaptable, Appropriate, Calm Orientation:  Oriented to Self, Oriented to Place, Oriented to  Time, Oriented to Situation Alcohol / Substance use:  Not Applicable Psych involvement (Current and /or in the community):  No (Comment)  Discharge Needs  Concerns to be addressed:  Care Coordination Readmission within the last 30 days:  No Current discharge risk:  None Barriers to Discharge:  No Barriers Identified   Monica Marra, LCSW 09/04/2015, 10:28 AM  

## 2015-09-04 NOTE — Progress Notes (Signed)
PT Cancellation Note:  Pt currently off floor for procedure.  Will re-attempt PT at a later date/time.  Leitha Bleak, [place]

## 2015-09-04 NOTE — Transfer of Care (Signed)
Immediate Anesthesia Transfer of Care Note  Patient: Pamela Preston  Procedure(s) Performed: Procedure(s): ENDOSCOPIC RETROGRADE CHOLANGIOPANCREATOGRAPHY (ERCP) (N/A)  Patient Location: PACU  Anesthesia Type:General  Level of Consciousness: awake  Airway & Oxygen Therapy: Patient Spontanous Breathing and Patient connected to nasal cannula oxygen  Post-op Assessment: Report given to RN and Post -op Vital signs reviewed and stable  Post vital signs: Reviewed and stable  Last Vitals:  Filed Vitals:   09/04/15 1245 09/04/15 1519  BP: 156/52 134/52  Pulse: 65 66  Temp: 37.1 C 36.9 C  Resp: 18 17    Complications: No apparent anesthesia complications

## 2015-09-04 NOTE — Anesthesia Preprocedure Evaluation (Signed)
Anesthesia Evaluation  Patient identified by MRN, date of birth, ID band Patient awake    Reviewed: Allergy & Precautions, H&P , NPO status , Patient's Chart, lab work & pertinent test results  History of Anesthesia Complications Negative for: history of anesthetic complications  Airway Mallampati: II  TM Distance: >3 FB Neck ROM: limited    Dental  (+) Poor Dentition   Pulmonary neg pulmonary ROS, neg shortness of breath,    Pulmonary exam normal breath sounds clear to auscultation       Cardiovascular Exercise Tolerance: Good hypertension, (-) angina(-) Past MI and (-) DOE Normal cardiovascular exam Rhythm:regular Rate:Normal     Neuro/Psych negative neurological ROS  negative psych ROS   GI/Hepatic negative GI ROS, Neg liver ROS, GERD  Controlled and Medicated,  Endo/Other  diabetes, Poorly Controlled, Type 2  Renal/GU ARF and CRFRenal disease  negative genitourinary   Musculoskeletal   Abdominal   Peds  Hematology negative hematology ROS (+)   Anesthesia Other Findings Past Medical History:   Hypertension                                                 GERD (gastroesophageal reflux disease)                       Diabetes mellitus without complication ([place])                Past Surgical History:   ABDOMINAL HYSTERECTOMY                                        KNEE ARTHROSCOPY                                              Patient is NPO appropriate and reports no nausea or vomiting today.  Reproductive/Obstetrics negative OB ROS                             Anesthesia Physical Anesthesia Plan  ASA: III  Anesthesia Plan: General   Post-op Pain Management:    Induction:   Airway Management Planned:   Additional Equipment:   Intra-op Plan:   Post-operative Plan:   Informed Consent: I have reviewed the patients History and Physical, chart, labs and discussed the procedure  including the risks, benefits and alternatives for the proposed anesthesia with the patient or authorized representative who has indicated his/her understanding and acceptance.   Dental Advisory Given  Plan Discussed with: Anesthesiologist, CRNA and Surgeon  Anesthesia Plan Comments:         Anesthesia Quick Evaluation

## 2015-09-04 NOTE — Anesthesia Postprocedure Evaluation (Signed)
Anesthesia Post Note  Patient: Destoni Lunday Harney  Procedure(s) Performed: Procedure(s) (LRB): ENDOSCOPIC RETROGRADE CHOLANGIOPANCREATOGRAPHY (ERCP) (N/A)  Patient location during evaluation: Endoscopy Anesthesia Type: General Level of consciousness: awake and alert Pain management: pain level controlled Vital Signs Assessment: post-procedure vital signs reviewed and stable Respiratory status: spontaneous breathing, nonlabored ventilation, respiratory function stable and patient connected to nasal cannula oxygen Cardiovascular status: blood pressure returned to baseline and stable Postop Assessment: no signs of nausea or vomiting Anesthetic complications: no    Last Vitals:  Filed Vitals:   09/04/15 1649 09/04/15 1659  BP: 150/70 142/47  Pulse: 67 68  Temp:    Resp: 15 17    Last Pain:  Filed Vitals:   09/04/15 1703  PainSc: 7                  Broadus John K Piscitello

## 2015-09-04 NOTE — Op Note (Signed)
ERCP done for Dr. Vira Agar.  Distal CBD stenosis with dilated CBD proximal to this stricture. Prominent ampulla. Biliary sphincterotomy done. No stones seen. Due to lack of adequate drainage, bx of ampulla and then 10 Fr x 5 cm biliary stent placed with drainage of bile afterwards.  Check 19-9 level. Clear liquids rest of today. Advance diet as tolerated.  May need EUS later. Thanks.

## 2015-09-04 NOTE — Progress Notes (Signed)
[place] at [place] NAME: Pamela Preston    MR#:  [id number]  DATE OF BIRTH:  [birth year]  SUBJECTIVE:  Came in with nausea dn vomiting with diarrhea. Found to have elevated LFT's No complaints today  REVIEW OF SYSTEMS:   Review of Systems  Constitutional: Positive for weight loss. Negative for fever and chills.  HENT: Negative for ear discharge, ear pain and nosebleeds.   Eyes: Negative for blurred vision, pain and discharge.  Respiratory: Negative for sputum production, shortness of breath, wheezing and stridor.   Cardiovascular: Negative for chest pain, palpitations, orthopnea and PND.  Gastrointestinal: Positive for nausea. Negative for vomiting, abdominal pain and diarrhea.  Genitourinary: Negative for urgency and frequency.  Musculoskeletal: Negative for back pain and joint pain.  Neurological: Negative for sensory change, speech change, focal weakness and weakness.  Psychiatric/Behavioral: Negative for depression and hallucinations. The patient is not nervous/anxious.   All other systems reviewed and are negative.  Tolerating Diet:yes Tolerating PT: home health PT  DRUG ALLERGIES:   Allergies  Allergen Reactions  . Colchicine   . Ketorolac   . Lipitor [Atorvastatin]     VITALS:  Blood pressure 141/59, pulse 70, temperature 97.5 F (36.4 C), temperature source Oral, resp. rate 16, height 5\' 8"  (1.727 m), weight 126.735 kg (279 lb 6.4 oz), SpO2 100 %.  PHYSICAL EXAMINATION:   Physical Exam  GENERAL:  82 y.o.-year-old patient lying in the bed with no acute distress. obese EYES: Pupils equal, round, reactive to light and accommodation. ++ scleral icterus. Extraocular muscles intact.  HEENT: Head atraumatic, normocephalic. Oropharynx and nasopharynx clear.  NECK:  Supple, no jugular venous distention. No thyroid enlargement, no tenderness.  LUNGS: Normal breath sounds bilaterally, no wheezing, rales, rhonchi. No use of  accessory muscles of respiration.  CARDIOVASCULAR: S1, S2 normal. No murmurs, rubs, or gallops.  ABDOMEN: Soft, nontender, nondistended. Bowel sounds present. No organomegaly or mass.  EXTREMITIES: No cyanosis, clubbing or edema b/l.    NEUROLOGIC: Cranial nerves II through XII are intact. No focal Motor or sensory deficits b/l.   PSYCHIATRIC:  patient is alert and oriented x 3.  SKIN: No obvious rash, lesion, or ulcer.   LABORATORY PANEL:  CBC  Recent Labs Lab 09/01/15 0537  WBC 6.4  HGB 11.3*  HCT 33.9*  PLT 233    Chemistries   Recent Labs Lab 08/31/15 1722 09/01/15 0537  NA 136 140  K 4.3 4.2  CL 101 107  CO2 26 26  GLUCOSE 160* 84  BUN 29* 24*  CREATININE 2.72* 2.48*  CALCIUM 9.1 8.7*  AST 346*  --   ALT 306*  --   ALKPHOS 336*  --   BILITOT 5.1*  --     Cardiac Enzymes No results for input(s): TROPONINI in the last 168 hours. RADIOLOGY:  Dg C-arm 1-60 Min-no Report  09/04/2015  CLINICAL DATA: stones in bile duct C-ARM 1-60 MINUTES Fluoroscopy was utilized by the requesting physician.  No radiographic interpretation.   ASSESSMENT AND PLAN:  Brie Gowell is a 82 y.o. female with a known history of diabetes mellitus, chronic kidney disease stage IV, chronic gout sent in from primary doctor's office secondary to nausea, vomiting, diarrhea, weight loss. Patient found to have elevated LFTs with jaundice with total bilirubin of 4.8 on recent blood work. Patient complains of nausea, vomiting for the past 2 weeks  1. Abdominal pain, nausea, vomiting with elevated LFTs with the obstructive jaundice: - Patient  total bili with 4.8 with ALT 244 and 290 of AST.  -reviewd  abdominal CAT scan without contrast -GI consult is obtained to evaluate for elevated LFTs with obstructive jaundice. -USG abdomen showed dilated CBD Stone vs mass -IV zosyn empirically -CA 19-9 pending.  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 Large prominent ampulla.- A sphincterotomy was performed.- The biliary tree was swept and nothing was found. - One temporary stent was placed into the CBD.  #2 history of breast cancer on Femara. Ne   #3 acute on chronic renal failure with CK disease stage 3: Recent creatinine is 2.9 --2.48 Baseline creatinine is 2.22-2.38 Resume lasix and lisinopril in am after CMP results are back  #4 .chronic gout patient on allopurinol  #5 Diabetes mellitus type 2 patient to the by mouth intake  - sliding scale with coverage and use half dose of Lantus.  #6.. generalized weakness  -Physical therapy consult noted rec HHPT  D/c in am with HHPT and out pt GI f/u    Case discussed with Care Management/Social Worker. Management plans discussed with the patient, family and they are in agreement.  CODE STATUS: full  DVT Prophylaxis: lovneox  TOTAL TIME TAKING CARE OF THIS PATIENT: 30 minutes.  >50% time spent on counselling and coordination of care pt and Dr Tiffany Kocher POSSIBLE D/C IN 12 DAYS, [place].  Note: This dictation was prepared with Dragon dictation along with smaller phrase technology. Any transcriptional errors that result from this process are unintentional.  Hilda Wexler M.D on 09/04/2015 at 6:03 PM  Between 7am to 6pm - Pager - [phone]  After 6pm go to [email or web address] - password [email or web address] [facility]  [facility]  Office  [phone]  CC: Primary care physician; [name], MD

## 2015-09-04 NOTE — Anesthesia Procedure Notes (Signed)
Date/Time: 09/04/2015 1:10 PM Performed by: Allean Found Pre-anesthesia Checklist: Patient identified, Emergency Drugs available, Suction available, Patient being monitored and Timeout performed Patient Re-evaluated:Patient Re-evaluated prior to inductionOxygen Delivery Method: Nasal cannula Intubation Type: IV induction Dental Injury: Teeth and Oropharynx as per pre-operative assessment

## 2015-09-04 NOTE — Op Note (Addendum)
[facility] Gastroenterology Patient Name: Pamela Preston Procedure Date: 09/04/2015 1:17 PM MRN: [id number] Account #: [id number] Date of Birth: Nov 20, 1929 Admit Type: Inpatient Age: 82 Room: [facility] ENDO ROOM 4 Gender: Female Note Status: Supervisor Override Procedure:         ERCP Indications:       Biliary dilation on Ultrasound, Abnormal liver function                     test Providers:         Lupita Dawn. Candace Cruise, MD Referring MD:      Precious Bard, MD (Referring MD) Medicines:         Monitored Anesthesia Care Complications:     No immediate complications. Procedure:         Pre-Anesthesia Assessment:                    - Prior to the procedure, a History and Physical was                     performed, and patient medications, allergies and                     sensitivities were reviewed. The patient's tolerance of                     previous anesthesia was reviewed.                    - The risks and benefits of the procedure and the sedation                     options and risks were discussed with the patient. All                     questions were answered and informed consent was obtained.                    - After reviewing the risks and benefits, the patient was                     deemed in satisfactory condition to undergo the procedure.                    After obtaining informed consent, the scope was passed                     under direct vision. Throughout the procedure, the                     patient's blood pressure, pulse, and oxygen saturations                     were monitored continuously. The Enteroscope was                     introduced through the mouth, and used to inject contrast                     into and used to inject contrast into the bile duct. The                     ERCP was performed with difficulty due to poor endoscopic  visualization. The patient tolerated the procedure well. Findings:      The  scout film was normal. The esophagus was successfully intubated       under direct vision. The scope was advanced to a normal major papilla in       the descending duodenum without detailed examination of the pharynx,       larynx and associated structures, and upper GI tract. The upper GI tract       was grossly normal. The bile duct was deeply cannulated with the       short-nosed traction sphincterotome. Contrast was injected. I personally       interpreted the bile duct images. Ductal flow of contrast was adequate.       Image quality was adequate. Contrast extended to the entire biliary       tree. The main bile duct was moderately dilated. The lower third of the       main duct contained a single moderate stenosis. A straight Roadrunner       wire was passed into the biliary tree. Biliary sphincterotomy was made       with a monofilament traction (standard) sphincterotome using ERBE       electrocautery. The sphincterotomy oozed blood. To discover objects, the       biliary tree was swept with a 12 mm balloon starting at the bifurcation.       Nothing was found. Biopsy of inside of ampulla done.. One 10 Fr by 5 cm       temporary stent with two external flaps and two internal flaps was       placed into the common bile duct with difficulty. Bile flowed through       the stent. The stent was in good position. Impression:        - The entire main bile duct was moderately dilated.                    - A moderate biliary stricture was found near the ampulla.                     Large prominent ampulla.                    - A sphincterotomy was performed.                    - The biliary tree was swept and nothing was found.                    - One temporary stent was placed into the CBD. Recommendation:    - Avoid aspirin and nonsteroidal anti-inflammatory                     medicines.                    - Observe patient's clinical course.                    - The findings and  recommendations were discussed with the                     patient.                    - Return to GI office in 1 month.                    -  Order 19-9 level. Await biopsy. Procedure Code(s): --- Professional ---                    [phone], Endoscopic retrograde cholangiopancreatography                     (ERCP); with placement of endoscopic stent into biliary or                     pancreatic duct, including pre- and post-dilation and                     guide wire passage, when performed, including                     sphincterotomy, when performed, each stent Diagnosis Code(s): --- Professional ---                    K83.8, Other specified diseases of biliary tract                    K83.1, Obstruction of bile duct                    R94.5, Abnormal results of liver function studies CPT copyright 2014 American Medical Association. All rights reserved. The codes documented in this report are preliminary and upon coder review may  be revised to meet current compliance requirements. Hulen Luster, MD 09/04/2015 3:01:56 PM This report has been signed electronically. Number of Addenda: 0 Note Initiated On: 09/04/2015 1:17 PM      [facility]

## 2015-09-04 NOTE — Progress Notes (Signed)
Nutrition Follow-up  DOCUMENTATION CODES:   Severe malnutrition in context of acute illness/injury  INTERVENTION:  Meals and snacks: Await diet progression Medical Nutrition Supplement Therapy: Recommend to continue boost breeze TID once able to resume po diet   NUTRITION DIAGNOSIS:   Inadequate oral intake related to altered GI function as evidenced by percent weight loss, meal completion < 50%, per patient/family report.    GOAL:   Patient will meet greater than or equal to 90% of their needs    MONITOR:    (Energy intake, Digestive system)  REASON FOR ASSESSMENT:   Malnutrition Screening Tool    ASSESSMENT:      Planning ERCP today   Current Nutrition: NPO for ERCP.  Pt reports intake maybe slightly better still not that good.  Likes boost breeze supplement   Gastrointestinal Profile: Last BM: 1/15  Scheduled Medications:  . amLODipine  10 mg Oral Daily  . feeding supplement  1 Container Oral TID BM  . glipiZIDE  5 mg Oral BID AC  . indomethacin  100 mg Rectal Once  . insulin aspart  0-9 Units Subcutaneous TID WC  . letrozole  2.5 mg Oral Daily  . levothyroxine  75 mcg Oral QAC breakfast  . metoprolol  50 mg Oral Daily  . pantoprazole  40 mg Oral QAC breakfast  . piperacillin-tazobactam  3.375 g Intravenous 3 times per day    Continuous Medications:  . sodium chloride 50 mL/hr at 09/03/15 1734  . sodium chloride       Electrolyte/Renal Profile and Glucose Profile:   Recent Labs Lab 08/31/15 1722 09/01/15 0537  NA 136 140  K 4.3 4.2  CL 101 107  CO2 26 26  BUN 29* 24*  CREATININE 2.72* 2.48*  CALCIUM 9.1 8.7*  GLUCOSE 160* 84   Protein Profile:  Recent Labs Lab 08/31/15 1722  ALBUMIN 3.3*      Weight Trend since Admission: Filed Weights   09/02/15 0509 09/03/15 0500 09/04/15 0500  Weight: 279 lb 9.6 oz (126.826 kg) 278 lb 14.4 oz (126.508 kg) 279 lb 6.4 oz (126.735 kg)      Diet Order:  Diet NPO time specified  Skin:    reviewed   Height:   Ht Readings from Last 1 Encounters:  08/31/15 5\' 8"  (1.727 m)    Weight:   Wt Readings from Last 1 Encounters:  09/04/15 279 lb 6.4 oz (126.735 kg)    Ideal Body Weight:     BMI:  Body mass index is 42.49 kg/(m^2).  Estimated Nutritional Needs:   Kcal:  BEE 1143 kcals (IF 1.0-1.2, AF 1.3) [id number] kcals/d  Using IBW of 64 kg  Protein:  (1.0-1.2 g/kg) 64-77 g/d  Fluid:  (25-73ml/kg) 1600-1916ml/d  EDUCATION NEEDS:   No education needs identified at this time  [place]. Zenia Resides, [place], [place] (pager) Weekend/On-Call pager [phone])

## 2015-09-04 NOTE — Brief Op Note (Signed)
Right arm infiltration site elevated on pillow with warm compresses for 2 hours post-ERCP as ordered by Dr. Andree Elk, anesthesia. 33mcgs Fentanyl given for abd pain and 4mg  Zofran given for nausea.

## 2015-09-05 ENCOUNTER — Encounter: Payer: Self-pay | Admitting: Gastroenterology

## 2015-09-05 LAB — COMPREHENSIVE METABOLIC PANEL
ALK PHOS: 275 U/L — AB (ref 38–126)
ALT: 143 U/L — AB (ref 14–54)
AST: 127 U/L — AB (ref 15–41)
Albumin: 2.8 g/dL — ABNORMAL LOW (ref 3.5–5.0)
Anion gap: 6 (ref 5–15)
BUN: 11 mg/dL (ref 6–20)
CHLORIDE: 113 mmol/L — AB (ref 101–111)
CO2: 19 mmol/L — AB (ref 22–32)
CREATININE: 1.58 mg/dL — AB (ref 0.44–1.00)
Calcium: 8.3 mg/dL — ABNORMAL LOW (ref 8.9–10.3)
GFR calc Af Amer: 34 mL/min — ABNORMAL LOW (ref >60)
GFR calc non Af Amer: 29 mL/min — ABNORMAL LOW (ref >60)
Glucose, Bld: 137 mg/dL — ABNORMAL HIGH (ref 65–99)
Potassium: 3.9 mmol/L (ref 3.5–5.1)
SODIUM: 138 mmol/L (ref 135–145)
Total Bilirubin: 1.2 mg/dL (ref 0.3–1.2)
Total Protein: 5.8 g/dL — ABNORMAL LOW (ref 6.5–8.1)

## 2015-09-05 LAB — GLUCOSE, CAPILLARY
GLUCOSE-CAPILLARY: 124 mg/dL — AB (ref 65–99)
GLUCOSE-CAPILLARY: 259 mg/dL — AB (ref 65–99)
GLUCOSE-CAPILLARY: 140 mg/dL — AB (ref 65–99)
Glucose-Capillary: 186 mg/dL — ABNORMAL HIGH (ref 65–99)

## 2015-09-05 LAB — LIPASE, BLOOD: LIPASE: 18 U/L (ref 11–51)

## 2015-09-05 NOTE — Care Management Important Message (Signed)
Important Message  Patient Details  Name: Pamela Preston MRN: [id number] Date of Birth: [birth year]   Medicare Important Message Given:  Yes    [name] A Yoshie Kosel 09/05/2015, 12:35 PM

## 2015-09-05 NOTE — Progress Notes (Signed)
Physical Therapy Treatment Patient Details Name: Pamela Preston MRN: [id number] DOB: [birth year] Today's Date: 09/05/2015    History of Present Illness Pamela Preston is a 82 y.o. female with a known history of diabetes mellitus, chronic kidney disease stage IV, chronic gout sent in from primary doctor's office secondary to nausea, vomiting, diarrhea, weight loss.  Pt s/p ERCP 09/04/15 and new order received to continue PT.    PT Comments    Pt stood with RW SBA (mildly increased effort to stand but steady without loss of balance).  Deferred additional mobility (and session ended) d/t pt with diarrhea upon standing and pt requesting to sit back down and get cleaned up (instead of transferring back to bed for clean-up).  Nursing aide called and to come assist pt with clean-up.   Follow Up Recommendations  Home health PT     Equipment Recommendations  Rolling walker with 5" wheels    Recommendations for Other Services       Precautions / Restrictions Precautions Precautions: Fall Restrictions Weight Bearing Restrictions: No    Mobility  Bed Mobility                  Transfers Overall transfer level: Needs assistance Equipment used: Rolling walker (2 wheeled) Transfers: Sit to/from Stand Sit to Stand: Supervision         General transfer comment: mildly increased effort to stand but steady without LOB  Ambulation/Gait             General Gait Details: deferred gait d/t pt with diarrhea upon standing   Stairs            Wheelchair Mobility    Modified Rankin (Stroke Patients Only)       Balance                                    Cognition Arousal/Alertness: Awake/alert Behavior During Therapy: WFL for tasks assessed/performed Overall Cognitive Status: Within Functional Limits for tasks assessed                      Exercises      General Comments   Nursing cleared pt for participation in physical therapy.  Pt  agreeable to PT session.      Pertinent Vitals/Pain Pain Assessment: No/denies pain    Home Living                      Prior Function            PT Goals (current goals can now be found in the care plan section) Acute Rehab PT Goals Patient Stated Goal: "To go back home." PT Goal Formulation: With patient Time For Goal Achievement: 09/15/15 Potential to Achieve Goals: Good Progress towards PT goals: Progressing toward goals    Frequency  Min 2X/week    PT Plan Current plan remains appropriate    Co-evaluation             End of Session Equipment Utilized During Treatment: Gait belt Activity Tolerance: Patient tolerated treatment well Patient left: in chair;with call bell/phone within reach;with chair alarm set     Time: [id number] PT Time Calculation (min) (ACUTE ONLY): 11 min  Charges:  $Therapeutic Activity: 8-22 mins                    G Codes:  Pamela Preston 09/05/2015, 3:04 PM Pamela Preston, [place]

## 2015-09-05 NOTE — Consult Note (Signed)
Patient sitting up and eating lunch, no abd pain today. No nausea/vomiting.  Her TB is gone down to normal.  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  She likely can go home and follow up with Dr. Candace Cruise in 1-2 weeks.

## 2015-09-05 NOTE — Care Management (Signed)
Anticipating discharge today.  CSW work has met with patient and discussed out of pocket options for SNF.  Patient has opted to return to home.  Upon further discussion patient states that she has [facility] that comes to the house for 3 hours a week.  Previously patient thought that she has been opened with [facility], however she has not ever been a patient with them.  Patient provided choice of home health agency.  Patient does not have a preference.  Brittney from [facility] notified of pending referral.  Awaiting MD order

## 2015-09-05 NOTE — Progress Notes (Signed)
[place] [facility] [place] NAME: Pamela Preston    MR#:  [id number]  DATE OF BIRTH:  1930/06/25  SUBJECTIVE:  Complains of some nausea eating solid lunch first time today.  REVIEW OF SYSTEMS:   Review of Systems  Constitutional: Positive for weight loss. Negative for fever and chills.  HENT: Negative for ear discharge, ear pain and nosebleeds.   Eyes: Negative for blurred vision, pain and discharge.  Respiratory: Negative for sputum production, shortness of breath, wheezing and stridor.   Cardiovascular: Negative for chest pain, palpitations, orthopnea and PND.  Gastrointestinal: Positive for nausea. Negative for vomiting, abdominal pain and diarrhea.  Genitourinary: Negative for urgency and frequency.  Musculoskeletal: Negative for back pain and joint pain.  Neurological: Negative for sensory change, speech change, focal weakness and weakness.  Psychiatric/Behavioral: Negative for depression and hallucinations. The patient is not nervous/anxious.   All other systems reviewed and are negative.  Tolerating Diet:yes Tolerating PT: home health PT  DRUG ALLERGIES:   Allergies  Allergen Reactions  . Colchicine   . Ketorolac   . Lipitor [Atorvastatin]     VITALS:  Blood pressure 137/56, pulse 78, temperature 97.5 F (36.4 C), temperature source Oral, resp. rate 16, height 5\' 8"  (1.727 m), weight 128.05 kg (282 lb 4.8 oz), SpO2 100 %.  PHYSICAL EXAMINATION:   Physical Exam  GENERAL:  82 y.o.-year-old patient lying in the bed with no acute distress. obese EYES: Pupils equal, round, reactive to light and accommodation. ++ scleral icterus. Extraocular muscles intact.  HEENT: Head atraumatic, normocephalic. Oropharynx and nasopharynx clear.  NECK:  Supple, no jugular venous distention. No thyroid enlargement, no tenderness.  LUNGS: Normal breath sounds bilaterally, no wheezing, rales, rhonchi. No use of accessory muscles of respiration.   CARDIOVASCULAR: S1, S2 normal. No murmurs, rubs, or gallops.  ABDOMEN: Soft, nontender, nondistended. Bowel sounds present. No organomegaly or mass.  EXTREMITIES: No cyanosis, clubbing or edema b/l.    NEUROLOGIC: Cranial nerves II through XII are intact. No focal Motor or sensory deficits b/l.   PSYCHIATRIC:  patient is alert and oriented x 3.  SKIN: No obvious rash, lesion, or ulcer.   LABORATORY PANEL:  CBC  Recent Labs Lab 09/01/15 0537  WBC 6.4  HGB 11.3*  HCT 33.9*  PLT 233    Chemistries   Recent Labs Lab 09/05/15 0518  NA 138  K 3.9  CL 113*  CO2 19*  GLUCOSE 137*  BUN 11  CREATININE 1.58*  CALCIUM 8.3*  AST 127*  ALT 143*  ALKPHOS 275*  BILITOT 1.2    Cardiac Enzymes No results for input(s): TROPONINI in the last 168 hours. RADIOLOGY:  Dg C-arm 1-60 Min-no Report  09/04/2015  CLINICAL DATA: stones in bile duct C-ARM 1-60 MINUTES Fluoroscopy was utilized by the requesting physician.  No radiographic interpretation.   ASSESSMENT AND PLAN:  Pamela Preston is a 82 y.o. female with a known history of diabetes mellitus, chronic kidney disease stage IV, chronic gout sent in from primary doctor's office secondary to nausea, vomiting, diarrhea, weight loss. Patient found to have elevated LFTs with jaundice with total bilirubin of 4.8 on recent blood work. Patient complains of nausea, vomiting for the past 2 weeks  1. 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 Large prominent ampulla.- A sphincterotomy was performed.- The biliary  tree was swept and nothing was found. - One temporary stent was placed into the CBD. Advance diet discharge in the morning  #2 history of breast cancer on Femara. Ne   #3 acute on chronic renal failure with CK disease stage 3: Recent creatinine is 2.9 --2.48 Baseline creatinine  is 2.22-2.38   #4 .chronic gout patient on allopurinol  #5 Diabetes mellitus type 2  - sliding scale with coverage and use half dose of Lantus.  #6.. generalized weakness  -Physical therapy consult noted rec HHPT    Case discussed with Care Management/Social Worker. Management plans discussed with the patient, family and they are in agreement.  CODE STATUS: full  DVT Prophylaxis: lovneox  TOTAL TIME TAKING CARE OF THIS PATIENT: 30 minutes.  Dustin Flock M.D on 09/05/2015 at 2:24 PM  Between 7am to 6pm - Pager - [phone]  After 6pm go to [email or web address] - password [email or web address] [facility]  [facility] [place] [facility]  Office  [phone]  CC: Primary care physician; [name], MD

## 2015-09-06 LAB — C DIFFICILE QUICK SCREEN W PCR REFLEX
C DIFFICILE (CDIFF) TOXIN: NEGATIVE
C DIFFICLE (CDIFF) ANTIGEN: POSITIVE — AB

## 2015-09-06 LAB — PANCREATIC ELASTASE, FECAL: Pancreatic Elastase-1, Stool: <50 ug Elast./g — ABNORMAL LOW (ref >200)

## 2015-09-06 LAB — CLOSTRIDIUM DIFFICILE BY PCR: Toxigenic C. Difficile by PCR: NEGATIVE

## 2015-09-06 LAB — GLUCOSE, CAPILLARY: GLUCOSE-CAPILLARY: 127 mg/dL — AB (ref 65–99)

## 2015-09-06 LAB — CA 19-9 (SERIAL): CA 19 9: 3 U/mL (ref 0–35)

## 2015-09-06 MED ORDER — LOPERAMIDE HCL 2 MG PO CAPS: 2.0000 mg | ORAL_CAPSULE | Freq: Four times a day (QID) | ORAL | Status: DC | PRN

## 2015-09-06 NOTE — Discharge Instructions (Signed)
°  DIET:  Low fat, Low cholesterol diet, carbohydrate controlled diet  DISCHARGE CONDITION:  Stable  ACTIVITY:  Activity as tolerated  OXYGEN:  Home Oxygen: No.   Oxygen Delivery: room air  DISCHARGE LOCATION:  home    ADDITIONAL DISCHARGE INSTRUCTION:   If you experience worsening of your admission symptoms, develop shortness of breath, life threatening emergency, suicidal or homicidal thoughts you must seek medical attention immediately by calling 911 or calling your MD immediately  if symptoms less severe.  You Must read complete instructions/literature along with all the possible adverse reactions/side effects for all the Medicines you take and that have been prescribed to you. Take any new Medicines after you have completely understood and accpet all the possible adverse reactions/side effects.   Please note  You were cared for by a hospitalist during your hospital stay. If you have any questions about your discharge medications or the care you received while you were in the hospital after you are discharged, you can call the unit and asked to speak with the hospitalist on call if the hospitalist that took care of you is not available. Once you are discharged, your primary care physician will handle any further medical issues. Please note that NO REFILLS for any discharge medications will be authorized once you are discharged, as it is imperative that you return to your primary care physician (or establish a relationship with a primary care physician if you do not have one) for your aftercare needs so that they can reassess your need for medications and monitor your lab values.

## 2015-09-06 NOTE — Discharge Summary (Signed)
Pamela Preston, 82 y.o., DOB September 13, 1929, MRN [id number]. Admission date: 08/31/2015 Discharge Date 09/06/2015 Primary MD [name], MD Admitting Physician Epifanio Lesches, MD  Admission Diagnosis  obstructive jaundice  LFT abnormality CBD stones BILE DUCT STONE  Discharge Diagnosis   Active Problems:   Obstructive jaundice due to biliary stricture status post that placement in spring to rectum he   Protein-calorie malnutrition, severe History of breast cancer Acute on chronic renal failure Diabetes type II       Hospital Course Pamela Preston is a 82 y.o. female with a known history of diabetes mellitus, chronic kidney disease stage IV, chronic gout sent in from primary doctor's office secondary to nausea, vomiting, diarrhea, weight loss. Patient found to have elevated LFTs with jaundice with total bilirubin of 4.8 oPatient complains of nausea, vomiting for the past 2 weeks. And weight loss about 40 pounds in the last 6 months. Due to the symptoms patient was brought to the emergency room. An hospitalized. She underwent a CT scan of the abdomen showed some calcification in the mesenteric and celiac arteries. Abdominal ultrasound showed biliary duct dilation with common bile duct measuring 11 mm. Due to this she was seen by GI. They performed the ERCP patient was noted to have common bile duct proximal to the stricture was noted. She underwent a biliary sparing corrected me. And a stent placement. The patient's symptoms started improving post procedure. She will need outpatient follow-up with GI in 2 weeks. At that time also CA-19-9 can be followed up.            Consults  GI  Significant Tests:  See full reports for all details      Ct Abdomen Pelvis Wo Contrast  08/31/2015  CLINICAL DATA:  Nausea, vomiting, and diarrhea. Stage IV chronic renal disease. Elevated liver enzymes EXAM: CT ABDOMEN AND PELVIS WITHOUT CONTRAST TECHNIQUE: Multidetector CT imaging of the  abdomen and pelvis was performed following the standard protocol without IV contrast. Oral contrast was administered. COMPARISON:  Jan 13, 2013 FINDINGS: Lower chest: Lung bases are clear. There is mitral annulus calcification. Hepatobiliary: No focal liver lesions are identified on this noncontrast enhanced study. The gallbladder appears mildly distended without wall thickening. There is no biliary duct dilatation. Pancreas: No pancreatic mass or inflammatory focus. Spleen: No splenic lesions are identified. Adrenals/Urinary Tract: There is a 1.2 x 0.8 cm left adrenal adenoma. Adrenals otherwise appear unremarkable. Kidneys are small bilaterally. There is a cyst arising from the lateral mid right kidney measuring 2.1 x 1.9 cm. There is a cyst arising from the lateral mid left kidney measuring 1.5 x 1.5 cm. There is a 1.2 x 0.8 cm noncystic masslike area along the posterior mid left kidney, unchanged from prior study. There is an extrarenal pelvis on each side. There is no demonstrable hydronephrosis, however. There is no renal or ureteral calculus on either side. The urinary bladder is midline with wall thickness within normal limits. Stomach/Bowel: There are multiple sigmoid diverticula without diverticulitis. There is no bowel wall or mesenteric thickening. No bowel obstruction. No free air or portal venous air. There is no appreciable bowel pneumatosis. Vascular/Lymphatic: There are atherosclerotic calcifications in the aorta and common iliac arteries. There are foci of calcification at the origins of the superior mesenteric and celiac artery regions. There is more calcification at the origins of these vessels than on the prior study. There is no adenopathy in the abdomen or pelvis. Reproductive: Uterus is absent. There is no pelvic  mass or pelvic fluid collection. Other: The appendix appears normal. There is no ascites or abscess in the abdomen or pelvis. There is a minimal ventral hernia containing only fat.  Musculoskeletal: There is extensive degenerative change throughout the lower thoracic and lumbar spine. There is marked disc narrowing at L4-5. There is vacuum phenomenon at L2-3, L3-4, and L5-S1. No blastic or lytic bone lesions are appreciable. There is no intramuscular or abdominal wall lesion. IMPRESSION: There is increased calcification at the origins of the superior mesenteric and celiac arteries compared to most recent prior study. No bowel pneumatosis. If there is concern for potential mesenteric ischemia, Doppler ultrasound of the major mesenteric arteries may be a reasonable consideration. No bowel pneumatosis or portal venous air is seen. No bowel wall thickening seen. No abscess. No bowel obstruction. Appendix appears normal. No renal or ureteral calculus. No hydronephrosis. Small non cystic masslike area along the left kidney is stable compared to prior study and likely is benign given the stability since 2014. Uterus absent.  Rather minimal ventral hernia containing only fat. Electronically Signed   By: Lowella Grip III M.D.   On: 08/31/2015 20:43   US Abdomen Complete  09/01/2015  CLINICAL DATA:  Obstructive jaundice. 40 pound weight loss in past 4 months. Chronic kidney disease. EXAM: ABDOMEN ULTRASOUND COMPLETE COMPARISON:  Noncontrast CT on 08/31/2015 FINDINGS: Gallbladder: Gallbladder is distended and contains echogenic sludge, however no distinct gallstones are seen. No evidence of gallbladder wall thickening or pericholecystic fluid. No sonographic Murphy sign noted by sonographer. Common bile duct: Diameter: Mildly dilated measuring 11 mm. Distal common bile duct not visualized due to bowel gas. Liver: No focal lesion identified. Within normal limits in parenchymal echogenicity. IVC: No abnormality visualized. Pancreas: Visualized portion unremarkable. Spleen: Size and appearance within normal limits. Right Kidney: Length: 10.5 cm. Echogenicity within normal limits. No mass or  hydronephrosis visualized. Subcapsular cyst again seen in the midpole measuring 1.9 cm. Left Kidney: Length: 10.8 cm. Echogenicity within normal limits. No mass or hydronephrosis visualized. Several small renal cysts noted. Abdominal aorta: No aneurysm visualized. Other findings: None. IMPRESSION: Distended gallbladder containing echogenic sludge. No discrete gallstones or sonographic signs of acute cholecystitis identified. Biliary ductal dilatation, with common bile duct measuring 11 mm. Distal common bile duct is not visualized. Recommend correlation with liver function tests, and consider abdomen MRI without and with contrast and MRCP for further evaluation. Electronically Signed   By: Earle Gell M.D.   On: 09/01/2015 13:26   Dg C-arm 1-60 Min-no Report  09/04/2015  CLINICAL DATA: stones in bile duct C-ARM 1-60 MINUTES Fluoroscopy was utilized by the requesting physician.  No radiographic interpretation.       Today   Subjective:   Pamela Preston  Feels much better abdominal pain resolved, diarrhea mostly resolved  Objective:   Blood pressure 125/58, pulse 69, temperature 98.2 F (36.8 C), temperature source Oral, resp. rate 18, height 5\' 8"  (1.727 m), weight 127.37 kg (280 lb 12.8 oz), SpO2 96 %.  .  Intake/Output Summary (Last 24 hours) at 09/06/15 1524 Last data filed at 09/06/15 0900  Gross per 24 hour  Intake    360 ml  Output      0 ml  Net    360 ml    Exam VITAL SIGNS: Blood pressure 125/58, pulse 69, temperature 98.2 F (36.8 C), temperature source Oral, resp. rate 18, height 5\' 8"  (1.727 m), weight 127.37 kg (280 lb 12.8 oz), SpO2 96 %.  GENERAL:  82 y.o.-year-old patient lying in the bed with no acute distress.  EYES: Pupils equal, round, reactive to light and accommodation. No scleral icterus. Extraocular muscles intact.  HEENT: Head atraumatic, normocephalic. Oropharynx and nasopharynx clear.  NECK:  Supple, no jugular venous distention. No thyroid enlargement, no  tenderness.  LUNGS: Normal breath sounds bilaterally, no wheezing, rales,rhonchi or crepitation. No use of accessory muscles of respiration.  CARDIOVASCULAR: S1, S2 normal. No murmurs, rubs, or gallops.  ABDOMEN: Soft, nontender, nondistended. Bowel sounds present. No organomegaly or mass.  EXTREMITIES: No pedal edema, cyanosis, or clubbing.  NEUROLOGIC: Cranial nerves II through XII are intact. Muscle strength 5/5 in all extremities. Sensation intact. Gait not checked.  PSYCHIATRIC: The patient is alert and oriented x 3.  SKIN: No obvious rash, lesion, or ulcer.   Data Review     CBC w Diff: Lab Results  Component Value Date   WBC 6.4 09/01/2015   WBC 6.1 11/19/2013   HGB 11.3* 09/01/2015   HGB 11.8* 11/19/2013   HCT 33.9* 09/01/2015   HCT 35.7 11/19/2013   PLT 233 09/01/2015   PLT 266 11/19/2013   LYMPHOPCT 26.9 01/14/2013   MONOPCT 8.7 01/14/2013   EOSPCT 4.2 01/14/2013   BASOPCT 0.9 01/14/2013   CMP: Lab Results  Component Value Date   NA 138 09/05/2015   NA 138 11/19/2013   K 3.9 09/05/2015   K 3.8 11/19/2013   CL 113* 09/05/2015   CL 103 11/19/2013   CO2 19* 09/05/2015   CO2 30 11/19/2013   BUN 11 09/05/2015   BUN 18 11/19/2013   CREATININE 1.58* 09/05/2015   CREATININE 2.31* 11/19/2013   PROT 5.8* 09/05/2015   PROT 7.4 11/19/2013   ALBUMIN 2.8* 09/05/2015   ALBUMIN 3.3* 11/19/2013   BILITOT 1.2 09/05/2015   BILITOT 0.4 11/19/2013   ALKPHOS 275* 09/05/2015   ALKPHOS 94 11/19/2013   AST 127* 09/05/2015   AST 16 11/19/2013   ALT 143* 09/05/2015   ALT 13 11/19/2013  .  Micro Results Recent Results (from the past 240 hour(s))  C difficile quick scan w PCR reflex     Status: Abnormal   Collection Time: 09/05/15 11:09 PM  Result Value Ref Range Status   C Diff antigen POSITIVE (A) NEGATIVE Final   C Diff toxin NEGATIVE NEGATIVE Final   C Diff interpretation   Final    Negative for toxigenic C. difficile. Toxin gene and active toxin production not  detected. May be a nontoxigenic strain of C. difficile bacteria present, lacking the ability to produce toxin.  Clostridium Difficile by PCR     Status: None   Collection Time: 09/05/15 11:09 PM  Result Value Ref Range Status   Toxigenic C Difficile by pcr NEGATIVE NEGATIVE Final     Code Status History    Date Active Date Inactive Code Status Order ID Comments User Context   08/31/2015  3:49 PM 09/06/2015  3:18 PM Full Code [id number]  Epifanio Lesches, MD Inpatient          Follow-up Information    Follow up with [facility], Lupita Dawn, MD. Go on 09/19/2015.   Specialty:  Internal Medicine   Why:  Wednesday at 9:00am for hospital follow-up   Contact information:   [place] [address] [phone]       Follow up with [name], MD. Go on 09/13/2015.   Specialty:  Physician Assistant   Why:  Thursday at 2:15pm for  hospital follow-up.   Contact information:   [place] [address] [phone]       Discharge Medications     Medication List    TAKE these medications        amLODipine 10 MG tablet  Commonly known as:  NORVASC  Take 10 mg by mouth daily.     aspirin 81 MG tablet  Take 81 mg by mouth daily.     febuxostat 40 MG tablet  Commonly known as:  ULORIC  Take 80 mg by mouth daily.     furosemide 20 MG tablet  Commonly known as:  LASIX  Take 20 mg by mouth daily.     glipiZIDE 5 MG tablet  Commonly known as:  GLUCOTROL  Take 5 mg by mouth daily before breakfast.     insulin glargine 100 UNIT/ML injection  Commonly known as:  LANTUS  Inject 25 Units into the skin 2 (two) times daily.     letrozole 2.5 MG tablet  Commonly known as:  FEMARA  Take 2.5 mg by mouth daily.     levothyroxine 75 MCG tablet  Commonly known as:  SYNTHROID, LEVOTHROID  Take 75 mcg by mouth daily before breakfast.     lisinopril 40 MG tablet  Commonly known as:  PRINIVIL,ZESTRIL   Take 40 mg by mouth daily.     loperamide 2 MG capsule  Commonly known as:  IMODIUM  Take 1 capsule (2 mg total) by mouth every 6 (six) hours as needed for diarrhea or loose stools.     metoprolol 50 MG tablet  Commonly known as:  LOPRESSOR  Take 50 mg by mouth daily.     omeprazole 20 MG capsule  Commonly known as:  PRILOSEC  Take 20 mg by mouth daily.     pravastatin 40 MG tablet  Commonly known as:  PRAVACHOL  Take 40 mg by mouth daily.           Total Time in preparing paper work, data evaluation and todays exam - 35 minutes  Dustin Flock M.D on 09/06/2015 at [facility] PM  [facility]   Office  [phone]

## 2015-09-07 LAB — SURGICAL PATHOLOGY

## 2015-09-14 ENCOUNTER — Other Ambulatory Visit: Payer: Self-pay | Admitting: Physician Assistant

## 2015-09-14 ENCOUNTER — Ambulatory Visit
Admission: RE | Admit: 2015-09-14 | Discharge: 2015-09-14 | Disposition: A | Discharge status: Home / Self Care | Payer: Medicare Other | Source: Ambulatory Visit | Attending: Physician Assistant | Admitting: Physician Assistant

## 2015-09-14 DIAGNOSIS — Z1239 Encounter for other screening for malignant neoplasm of breast: Secondary | ICD-10-CM

## 2015-09-14 DIAGNOSIS — Z853 Personal history of malignant neoplasm of breast: Secondary | ICD-10-CM

## 2015-09-14 HISTORY — DX: Malignant neoplasm of uterus, part unspecified: C55

## 2015-09-14 HISTORY — DX: Malignant neoplasm of unspecified site of unspecified female breast: C50.919

## 2015-09-20 ENCOUNTER — Telehealth: Payer: Self-pay

## 2015-09-20 NOTE — Telephone Encounter (Signed)
  Oncology Nurse Navigator Documentation  Navigator Location: [facility] Onc (09/20/15 1000) Navigator Encounter Type: Telephone (09/20/15 1000) Telephone: Outgoing Call (09/20/15 1000) Abnormal Finding Date: 09/01/15 (09/20/15 1000)   Surgery Date: 09/04/15 (ERCP 09/04/15) (09/20/15 1000)     Treatment Phase: Abnormal Scans (09/20/15 1000) Barriers/Navigation Needs: Coordination of Care (09/20/15 1000)   Interventions: Coordination of Care (09/20/15 1000)   Coordination of Care: EUS (09/20/15 1000)                  Time Spent with Patient: 15 (09/20/15 1000)   Received referral for EUS. Scheduled for 10/04/15 with Dr Mont Dutton at [facility]. Attempted to call patient. No answer/no voicemail. Will mail date/instructions and continue to contact.

## 2015-09-20 NOTE — Progress Notes (Signed)
  Oncology Nurse Navigator Documentation  Navigator Location: [facility] Onc (09/20/15 1038)                                            Time Spent with Patient: 15 (09/20/15 1038)   INSTRUCTIONS FOR ENDOSCOPIC ULTRASOUND -Your procedure has been scheduled for February 16th with Dr Mont Dutton at [facility] will contact you to pre-register over the phone. If for any reason you have not received a call within one week prior to your scheduled procedure date, please call [phone]. -To get your scheduled arrival time, please call the Endoscopy unit at  [phone] between 1-3pm on: February 15th   -ON THE DAY OF YOU PROCEDURE:  1. If you are scheduled for a morning procedure, nothing to drink after midnight  -If you are scheduled for an afternoon procedure, you may have clear liquids until 5 hours prior  to the procedure but no carbonated drinks or broth  2. NO FOOD THE DAY OF YOUR PROCEDURE  3. You may take your heart, seizure, blood pressure, Parkinson's or breathing medications at  6am with just enough water to get your pills down  4. Do not take any oral Diabetic medications the morning of your procedure.  5. If you are a diabetic and are using insulin, please notify your prescribing physician of this  procedure as your dose may need to be altered related to not being able to eat or drink.   5. Do not take Vitamins   -On the day of your procedure, come to the [facility] Admitting/Registration desk (First desk on the right) at the scheduled arrival time. You MUST have someone drive you home from your procedure. You must have a responsible adult with a valid drivers license who is on site throughout your entire procedure and who can stay with you for several hours after your procedure. You may not go home alone in a taxi, shuttle [place] or bus, as the drivers will not be responsible for you.  --If you have any questions please call me at the above contact

## 2015-09-21 ENCOUNTER — Telehealth: Payer: Self-pay

## 2015-09-21 NOTE — Telephone Encounter (Signed)
  Oncology Nurse Navigator Documentation  Navigator Location: [facility] Onc (09/21/15 0900) Navigator Encounter Type: Telephone (09/21/15 0900)               Barriers/Navigation Needs: Coordination of Care (09/21/15 0900)   Interventions: Coordination of Care (09/21/15 0900)   Coordination of Care: EUS (09/21/15 0900)                  Time Spent with Patient: 30 (09/21/15 0900)   Spoke with Ms Narducci on the phone. Went over EUS instructions with her. Instructed her to notify prescribing MD about her insulin for the procedure. A copy has also been mailed to her address. Address verified. My contact information was included for any future questions regarding EUS. EUS scheduled for 10/04/15 with Dr Mont Dutton at [facility].

## 2015-10-04 ENCOUNTER — Encounter
Admission: RE | Disposition: A | Discharge status: Home / Self Care | Payer: Self-pay | Source: Ambulatory Visit | Attending: Internal Medicine

## 2015-10-04 ENCOUNTER — Ambulatory Visit: Payer: Medicare Other | Admitting: Certified Registered Nurse Anesthetist

## 2015-10-04 ENCOUNTER — Ambulatory Visit
Admission: RE | Admit: 2015-10-04 | Discharge: 2015-10-04 | Disposition: A | Discharge status: Home / Self Care | Payer: Medicare Other | Source: Ambulatory Visit | Attending: Internal Medicine | Admitting: Internal Medicine

## 2015-10-04 ENCOUNTER — Encounter: Payer: Self-pay | Admitting: *Deleted

## 2015-10-04 DIAGNOSIS — K317 Polyp of stomach and duodenum: Secondary | ICD-10-CM | POA: Diagnosis not present

## 2015-10-04 DIAGNOSIS — Z8249 Family history of ischemic heart disease and other diseases of the circulatory system: Secondary | ICD-10-CM | POA: Diagnosis not present

## 2015-10-04 DIAGNOSIS — E1122 Type 2 diabetes mellitus with diabetic chronic kidney disease: Secondary | ICD-10-CM | POA: Diagnosis not present

## 2015-10-04 DIAGNOSIS — Z90710 Acquired absence of both cervix and uterus: Secondary | ICD-10-CM | POA: Insufficient documentation

## 2015-10-04 DIAGNOSIS — N189 Chronic kidney disease, unspecified: Secondary | ICD-10-CM | POA: Insufficient documentation

## 2015-10-04 DIAGNOSIS — K869 Disease of pancreas, unspecified: Secondary | ICD-10-CM | POA: Diagnosis present

## 2015-10-04 DIAGNOSIS — K449 Diaphragmatic hernia without obstruction or gangrene: Secondary | ICD-10-CM | POA: Diagnosis not present

## 2015-10-04 DIAGNOSIS — Z96653 Presence of artificial knee joint, bilateral: Secondary | ICD-10-CM | POA: Insufficient documentation

## 2015-10-04 DIAGNOSIS — Z79899 Other long term (current) drug therapy: Secondary | ICD-10-CM | POA: Insufficient documentation

## 2015-10-04 DIAGNOSIS — M109 Gout, unspecified: Secondary | ICD-10-CM | POA: Insufficient documentation

## 2015-10-04 DIAGNOSIS — K219 Gastro-esophageal reflux disease without esophagitis: Secondary | ICD-10-CM | POA: Insufficient documentation

## 2015-10-04 DIAGNOSIS — E785 Hyperlipidemia, unspecified: Secondary | ICD-10-CM | POA: Insufficient documentation

## 2015-10-04 DIAGNOSIS — Z888 Allergy status to other drugs, medicaments and biological substances status: Secondary | ICD-10-CM | POA: Diagnosis not present

## 2015-10-04 DIAGNOSIS — Z794 Long term (current) use of insulin: Secondary | ICD-10-CM | POA: Insufficient documentation

## 2015-10-04 DIAGNOSIS — J309 Allergic rhinitis, unspecified: Secondary | ICD-10-CM | POA: Insufficient documentation

## 2015-10-04 DIAGNOSIS — M1990 Unspecified osteoarthritis, unspecified site: Secondary | ICD-10-CM | POA: Diagnosis not present

## 2015-10-04 DIAGNOSIS — Z7982 Long term (current) use of aspirin: Secondary | ICD-10-CM | POA: Diagnosis not present

## 2015-10-04 DIAGNOSIS — E559 Vitamin D deficiency, unspecified: Secondary | ICD-10-CM | POA: Insufficient documentation

## 2015-10-04 DIAGNOSIS — Z823 Family history of stroke: Secondary | ICD-10-CM | POA: Insufficient documentation

## 2015-10-04 DIAGNOSIS — C250 Malignant neoplasm of head of pancreas: Secondary | ICD-10-CM | POA: Diagnosis not present

## 2015-10-04 DIAGNOSIS — E669 Obesity, unspecified: Secondary | ICD-10-CM | POA: Insufficient documentation

## 2015-10-04 DIAGNOSIS — K831 Obstruction of bile duct: Secondary | ICD-10-CM | POA: Insufficient documentation

## 2015-10-04 DIAGNOSIS — Z6841 Body Mass Index (BMI) 40.0 and over, adult: Secondary | ICD-10-CM | POA: Diagnosis not present

## 2015-10-04 DIAGNOSIS — I129 Hypertensive chronic kidney disease with stage 1 through stage 4 chronic kidney disease, or unspecified chronic kidney disease: Secondary | ICD-10-CM | POA: Diagnosis not present

## 2015-10-04 DIAGNOSIS — Z853 Personal history of malignant neoplasm of breast: Secondary | ICD-10-CM | POA: Diagnosis not present

## 2015-10-04 HISTORY — PX: UPPER ESOPHAGEAL ENDOSCOPIC ULTRASOUND (EUS): SHX6562

## 2015-10-04 LAB — GLUCOSE, CAPILLARY: Glucose-Capillary: 155 mg/dL — ABNORMAL HIGH (ref 65–99)

## 2015-10-04 SURGERY — UPPER ESOPHAGEAL ENDOSCOPIC ULTRASOUND (EUS)
Anesthesia: General

## 2015-10-04 MED ORDER — PROPOFOL 10 MG/ML IV BOLUS
INTRAVENOUS | Status: DC | PRN
  Administered 2015-10-04 (×3): 20 mg via INTRAVENOUS
  Administered 2015-10-04: 10 mg via INTRAVENOUS
  Administered 2015-10-04: 20 mg via INTRAVENOUS

## 2015-10-04 MED ORDER — PROPOFOL 500 MG/50ML IV EMUL
INTRAVENOUS | Status: DC | PRN
  Administered 2015-10-04: 140 ug/kg/min via INTRAVENOUS

## 2015-10-04 MED ORDER — GLYCOPYRROLATE 0.2 MG/ML IJ SOLN
INTRAMUSCULAR | Status: DC | PRN
  Administered 2015-10-04: 0.2 mg via INTRAVENOUS

## 2015-10-04 MED ORDER — MIDAZOLAM HCL 2 MG/2ML IJ SOLN
INTRAMUSCULAR | Status: DC | PRN
  Administered 2015-10-04: 1 mg via INTRAVENOUS

## 2015-10-04 MED ORDER — SODIUM CHLORIDE 0.9 % IV SOLN
INTRAVENOUS | Status: DC
  Administered 2015-10-04: 12:00:00 via INTRAVENOUS

## 2015-10-04 MED ORDER — LIDOCAINE HCL (CARDIAC) 20 MG/ML IV SOLN
INTRAVENOUS | Status: DC | PRN
  Administered 2015-10-04: 60 mg via INTRAVENOUS

## 2015-10-04 NOTE — Transfer of Care (Signed)
Immediate Anesthesia Transfer of Care Note  Patient: Pamela Preston  Procedure(s) Performed: Procedure(s): UPPER ESOPHAGEAL ENDOSCOPIC ULTRASOUND (EUS) (N/A)  Patient Location: PACU  Anesthesia Type:General  Level of Consciousness: sedated  Airway & Oxygen Therapy: Patient Spontanous Breathing and Patient connected to nasal cannula oxygen  Post-op Assessment: Report given to RN and Post -op Vital signs reviewed and stable  Post vital signs: Reviewed and stable  Last Vitals:  Filed Vitals:   10/04/15 1150  BP: 152/64  Pulse: 77  Temp: 36.3 C  Resp: 20    Complications: No apparent anesthesia complications

## 2015-10-04 NOTE — Anesthesia Procedure Notes (Signed)
Date/Time: 10/04/2015 12:20 PM Performed by: Johnna Acosta Pre-anesthesia Checklist: Patient identified, Emergency Drugs available, Suction available, Patient being monitored and Timeout performed Patient Re-evaluated:Patient Re-evaluated prior to inductionOxygen Delivery Method: Nasal cannula

## 2015-10-04 NOTE — Anesthesia Preprocedure Evaluation (Signed)
Anesthesia Evaluation  Patient identified by MRN, date of birth, ID band Patient awake    Reviewed: Allergy & Precautions, NPO status , Patient's Chart, lab work & pertinent test results, reviewed documented beta blocker date and time   Airway Mallampati: III  TM Distance: >3 FB     Dental  (+) Chipped   Pulmonary           Cardiovascular hypertension, Pt. on medications and Pt. on home beta blockers      Neuro/Psych    GI/Hepatic GERD  ,  Endo/Other  diabetes  Renal/GU Renal disease     Musculoskeletal   Abdominal   Peds  Hematology   Anesthesia Other Findings Renal disease. Obsity. Gout.  Reproductive/Obstetrics                             Anesthesia Physical Anesthesia Plan  ASA: III  Anesthesia Plan: General   Post-op Pain Management:    Induction: Intravenous  Airway Management Planned: Nasal Cannula  Additional Equipment:   Intra-op Plan:   Post-operative Plan:   Informed Consent: I have reviewed the patients History and Physical, chart, labs and discussed the procedure including the risks, benefits and alternatives for the proposed anesthesia with the patient or authorized representative who has indicated his/her understanding and acceptance.     Plan Discussed with: CRNA  Anesthesia Plan Comments:         Anesthesia Quick Evaluation

## 2015-10-04 NOTE — H&P (View-Only) (Signed)
Patient sitting up and eating lunch, no abd pain today. No nausea/vomiting.  Her TB is gone down to normal.  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  She likely can go home and follow up with Dr. Candace Cruise in 1-2 weeks.

## 2015-10-04 NOTE — Anesthesia Postprocedure Evaluation (Signed)
Anesthesia Post Note  Patient: Pamela Preston  Procedure(s) Performed: Procedure(s) (LRB): UPPER ESOPHAGEAL ENDOSCOPIC ULTRASOUND (EUS) (N/A)  Patient location during evaluation: Endoscopy Anesthesia Type: General Level of consciousness: awake and alert Pain management: pain level controlled Vital Signs Assessment: post-procedure vital signs reviewed and stable Respiratory status: spontaneous breathing, nonlabored ventilation, respiratory function stable and patient connected to nasal cannula oxygen Cardiovascular status: blood pressure returned to baseline and stable Postop Assessment: no signs of nausea or vomiting Anesthetic complications: no    Last Vitals:  Filed Vitals:   10/04/15 1317 10/04/15 1326  BP: 115/53 122/49  Pulse: 80 77  Temp:    Resp: 12 18    Last Pain: There were no vitals filed for this visit.               Cambri Plourde S

## 2015-10-04 NOTE — Op Note (Signed)
[facility] Patient Name: Pamela Preston Procedure Date: 10/04/2015 12:14 PM MRN: [id number] Account #: [id number] Date of Birth: 09-07-1929 Admit Type: Outpatient Age: 82 Room: [facility] ENDO ROOM 3 Gender: Female Note Status: Finalized Procedure:            Upper EUS Indications:          Common bile duct dilation (etiology unknown) seen on CT                        scan, Obstruction of bile duct on ERCP s/p plastic                        stent placement Patient Profile:      Refer to note in patient chart for documentation of                        history and physical. Providers:            Murray Hodgkins. [place] Referring MD:         Lupita Dawn. Candace Cruise, MD (Referring MD), Precious Bard, MD                        (Referring MD) Medicines:            Propofol per Anesthesia Complications:        No immediate complications. Procedure:            Pre-Anesthesia Assessment:                       Prior to the procedure, a History and Physical was                        performed, and patient medications and allergies were                        reviewed. The patient is competent. The risks and                        benefits of the procedure and the sedation options and                        risks were discussed with the patient. All questions                        were answered and informed consent was obtained.                        Patient identification and proposed procedure were                        verified by the physician, the nurse and the                        anesthetist in the pre-procedure area. Mental Status                        Examination: alert and oriented. Airway Examination:                        normal oropharyngeal airway  and neck mobility.                        Respiratory Examination: clear to auscultation. CV                        Examination: normal. Prophylactic Antibiotics: The                        patient does not  require prophylactic antibiotics.                        Prior Anticoagulants: The patient has taken no previous                        anticoagulant or antiplatelet agents. ASA Grade                        Assessment: III - A patient with severe systemic                        disease. After reviewing the risks and benefits, the                        patient was deemed in satisfactory condition to undergo                        the procedure. The anesthesia plan was to use monitored                        anesthesia care (MAC). Immediately prior to                        administration of medications, the patient was                        re-assessed for adequacy to receive sedatives. The                        heart rate, respiratory rate, oxygen saturations, blood                        pressure, adequacy of pulmonary ventilation, and                        response to care were monitored throughout the                        procedure. The physical status of the patient was                        re-assessed after the procedure.                       After obtaining informed consent, the endoscope was                        passed under direct vision. Throughout the procedure,                        the patient's blood pressure, pulse, and oxygen  saturations were monitored continuously.The upper EUS                        was accomplished without difficulty. The patient                        tolerated the procedure well. The EUS GI Linear Array                        [id number] was introduced through the mouth, and advanced                        to the duodenum for ultrasound examination from the                        esophagus, stomach and duodenum. The ERCP was                        introduced through the mouth, and advanced to the                        second part of duodenum. Findings:      Endoscopic Finding :      A small hiatal hernia was present.       Multiple small sessile polyps were found in the cardia, fundus and body       of the stomach.      A previously placed biliary stent was seen emerging from the ampulla.      The exam of the duodenum was otherwise normal.      Endosonographic Finding :      An irregular mass was identified in the pancreatic head. The mass was       hypoechoic. The mass measured 23 mm by 17 mm in maximal cross-sectional       diameter. The outer margins were irregular. There was sonographic       evidence suggesting invasion into the superior mesenteric vein       (manifested by abutment). Fine needle biopsy was performed. Color       Doppler imaging was utilized prior to needle puncture to confirm a lack       of significant vascular structures within the needle path. Four passes       were made with the 25 gauge Medtronic Sharkcore ultrasound biopsy needle       using a transduodenal approach. Final cytology results are pending.      Pancreatic abnormalities were noted in the entire pancreas upstream of       the mass. These consisted of parenchymal atrophy and ductal dilation (up       to 8.9 mm in the head).      One stent was visualized endosonographically in the common bile duct.       Extension of the stent was noted in the common hepatic duct. A moderate       amount of sludge was seen within the bile duct. The CBD measured 6.5 mm       and the CHD measured 9.7 mm.      Endosonographic imaging in the left lobe of the liver showed no       abnormalities.      No lymphadenopathy seen.      The celiac region was visualized and showed no  sign of significant       endosonographic abnormality. Impression:           EGD Impressions:                       - The esophagus was not well visualized with the                        duodenoscope.                       - Small hiatal hernia.                       - Multiple gastric polyps. Consistent in appearance                        with benign fundic  gland polyps.                       - Biliary stent emerging from the major ampulla.                        Otherwise normal examined duodenum.                       EUS Impressions:                       - A mass was identified in the pancreatic head. The                        endosonographic appearance is suspicious for                        adenocarcinoma. Less likely inflammatory changes                        (pancreatitis) related to recent ERCP. This was staged                        T2 N0 Mx by endosonographic criteria (2017 AJCC                        guidelines). The staging applies if malignancy is                        confirmed. Fine needle biopsy performed.                       - Pancreatic parenchymal abnormalities consisting of                        atrophy and ductal dilation were noted in the entire                        pancreas upstream of the mass.                       - One stent was visualized endosonographically in the                        common bile duct. There was also a  moderate amount of                        sludge within the bile duct.                       - Normal visualized portions of the liver.                       - No lymphadenopathy seen.                       - Normal celiac region. Recommendation:       - Discharge patient to home (ambulatory).                       - Await cytology results.                       - Refer to a medical/radiation oncologist and pancreas                        surgeon if cytology is diagnostic of malignancy.                       - The findings and recommendations were discussed with                        the patient and her family.                       - Return to referring physician as previously scheduled. Procedure Code(s):    --- Professional ---                       [phone], Esophagogastroduodenoscopy, flexible, transoral;                        with transendoscopic ultrasound-guided intramural or                         transmural fine needle aspiration/biopsy(s) (includes                        endoscopic ultrasound examination of the esophagus,                        stomach, and either the duodenum or a surgically                        altered stomach where the jejunum is examined distal to                        the anastomosis) Diagnosis Code(s):    --- Professional ---                       R93.2, Abnormal findings on diagnostic imaging of liver                        and biliary tract                       K83.1, Obstruction of bile duct  K86.89, Other specified diseases of pancreas                       K86.9, Disease of pancreas, unspecified                       K31.7, Polyp of stomach and duodenum                       K44.9, Diaphragmatic hernia without obstruction or                        gangrene CPT copyright 2016 American Medical Association. All rights reserved. The codes documented in this report are preliminary and upon coder review may  be revised to meet current compliance requirements. Attending Participation:      I personally performed the entire procedure without the assistance of a       fellow, resident or surgical assistant. [place],  10/04/2015 1:04:18 PM This report has been signed electronically. Number of Addenda: 0 Note Initiated On: 10/04/2015 12:14 PM      [facility]

## 2015-10-04 NOTE — Interval H&P Note (Signed)
History and Physical Interval Note:  10/04/2015 11:58 AM  Pamela Preston  has presented today for surgery, with the diagnosis of STAGE IV CHRONIC RENAL DISEASE  The various methods of treatment have been discussed with the patient and family. After consideration of risks, benefits and other options for treatment, the patient has consented to  Procedure(s): UPPER ESOPHAGEAL ENDOSCOPIC ULTRASOUND (EUS) (N/A) as a surgical intervention .  The patient's history has been reviewed, patient examined, no change in status, stable for surgery.  I have reviewed the patient's chart and labs.  Questions were answered to the patient's satisfaction.     Tillie Rung

## 2015-10-05 LAB — CYTOLOGY - NON PAP

## 2015-10-08 ENCOUNTER — Encounter: Payer: Self-pay | Admitting: Internal Medicine

## 2015-10-08 NOTE — Progress Notes (Signed)
  Oncology Nurse Navigator Documentation  Navigator Location: [facility] (10/08/15 1000) Navigator Encounter Type: Other (EUS results) (10/08/15 1000)     Confirmed Diagnosis Date: 10/05/15 (pancreatic head mass adenocarcinoma) (10/08/15 1000) Surgery Date: 10/04/15 (upper EUS) (10/08/15 1000)       Barriers/Navigation Needs: Coordination of Care (10/08/15 1000)   Interventions: Coordination of Care (10/08/15 1000)                      Time Spent with Patient: 30 (10/08/15 1000)   Pathology routed to Dr Candace Cruise and faxed to Faye Ramsay NP. Copy sent to Dr Mont Dutton.

## 2015-10-09 NOTE — Progress Notes (Signed)
  Oncology Nurse Navigator Documentation  Navigator Location: [facility] (10/09/15 1600)                                            Time Spent with Patient: 15 (10/09/15 1600)   Message sent to Faye Ramsay NP at [facility] to inquire if Ms Renz had received her pathology report before reaching out to her. Also can facilitate medical oncology appt.

## 2015-10-10 NOTE — Progress Notes (Signed)
  Oncology Nurse Navigator Documentation  Navigator Location: [facility] (10/10/15 0800)                 Barriers/Navigation Needs: Coordination of Care (10/10/15 0800)   Interventions: Coordination of Care;Referrals (10/10/15 0800)            Acuity: Level 2 (10/10/15 0800)   Acuity Level 2: Initial guidance, education and coordination as needed;Educational needs;Assistance expediting appointments;Ongoing guidance and education throughout treatment as needed (10/10/15 0800)     Time Spent with Patient: 15 (10/10/15 0800)   RE: pathology  Received: [place], NP Mariea Clonts             [facility],  I just phoned her. She wasn't aware of results. I told her briefly that cancer cells were phone. She is coming in on tomorrow at noon so I can discuss result further with her. Yes, please make Oncology appointment. Thanks    Referral sent to scheduling.

## 2015-10-11 ENCOUNTER — Telehealth: Payer: Self-pay

## 2015-10-11 DIAGNOSIS — C250 Malignant neoplasm of head of pancreas: Secondary | ICD-10-CM | POA: Insufficient documentation

## 2015-10-11 NOTE — Telephone Encounter (Signed)
  Oncology Nurse Navigator Documentation  Navigator Location: [facility] (10/11/15 0900)                 Barriers/Navigation Needs: Coordination of Care (10/11/15 0900)   Interventions: Coordination of Care;Referrals (10/11/15 0900)                      Time Spent with Patient: 15 (10/11/15 0900)   Spoke with Ms Vore. She has seen Dr Oliva Bustard in the past for Breast cancer and he will see her for new diagnosis pancreatic cancer. 10/16/15 at 1330. Read back performed

## 2015-10-16 ENCOUNTER — Inpatient Hospital Stay: Payer: Medicare Other | Attending: Oncology | Admitting: Oncology

## 2015-10-16 ENCOUNTER — Inpatient Hospital Stay: Payer: Medicare Other

## 2015-10-16 ENCOUNTER — Encounter: Payer: Self-pay | Admitting: Oncology

## 2015-10-16 VITALS — BP 147/65 | HR 65 | Temp 97.4°F | Resp 18 | Wt 277.2 lb

## 2015-10-16 DIAGNOSIS — E119 Type 2 diabetes mellitus without complications: Secondary | ICD-10-CM | POA: Diagnosis not present

## 2015-10-16 DIAGNOSIS — Z853 Personal history of malignant neoplasm of breast: Secondary | ICD-10-CM | POA: Insufficient documentation

## 2015-10-16 DIAGNOSIS — Z794 Long term (current) use of insulin: Secondary | ICD-10-CM | POA: Diagnosis not present

## 2015-10-16 DIAGNOSIS — N189 Chronic kidney disease, unspecified: Secondary | ICD-10-CM | POA: Insufficient documentation

## 2015-10-16 DIAGNOSIS — D509 Iron deficiency anemia, unspecified: Secondary | ICD-10-CM | POA: Insufficient documentation

## 2015-10-16 DIAGNOSIS — E039 Hypothyroidism, unspecified: Secondary | ICD-10-CM | POA: Insufficient documentation

## 2015-10-16 DIAGNOSIS — I129 Hypertensive chronic kidney disease with stage 1 through stage 4 chronic kidney disease, or unspecified chronic kidney disease: Secondary | ICD-10-CM | POA: Insufficient documentation

## 2015-10-16 DIAGNOSIS — C250 Malignant neoplasm of head of pancreas: Secondary | ICD-10-CM

## 2015-10-16 DIAGNOSIS — Z7984 Long term (current) use of oral hypoglycemic drugs: Secondary | ICD-10-CM | POA: Insufficient documentation

## 2015-10-16 DIAGNOSIS — E669 Obesity, unspecified: Secondary | ICD-10-CM | POA: Insufficient documentation

## 2015-10-16 DIAGNOSIS — J309 Allergic rhinitis, unspecified: Secondary | ICD-10-CM | POA: Insufficient documentation

## 2015-10-16 DIAGNOSIS — Z79899 Other long term (current) drug therapy: Secondary | ICD-10-CM

## 2015-10-16 DIAGNOSIS — Z7982 Long term (current) use of aspirin: Secondary | ICD-10-CM | POA: Insufficient documentation

## 2015-10-16 DIAGNOSIS — E785 Hyperlipidemia, unspecified: Secondary | ICD-10-CM | POA: Insufficient documentation

## 2015-10-16 DIAGNOSIS — K219 Gastro-esophageal reflux disease without esophagitis: Secondary | ICD-10-CM | POA: Insufficient documentation

## 2015-10-16 DIAGNOSIS — M109 Gout, unspecified: Secondary | ICD-10-CM | POA: Insufficient documentation

## 2015-10-16 DIAGNOSIS — M1990 Unspecified osteoarthritis, unspecified site: Secondary | ICD-10-CM | POA: Insufficient documentation

## 2015-10-16 DIAGNOSIS — I10 Essential (primary) hypertension: Secondary | ICD-10-CM | POA: Insufficient documentation

## 2015-10-16 LAB — MAGNESIUM: Magnesium: 1.7 mg/dL (ref 1.7–2.4)

## 2015-10-16 LAB — CBC WITH DIFFERENTIAL/PLATELET
BASOS ABS: 0.1 10*3/uL (ref 0–0.1)
BASOS PCT: 1 %
Eosinophils Absolute: 0.2 10*3/uL (ref 0–0.7)
Eosinophils Relative: 3 %
HEMATOCRIT: 34.6 % — AB (ref 35.0–47.0)
HEMOGLOBIN: 11.3 g/dL — AB (ref 12.0–16.0)
LYMPHS PCT: 27 %
Lymphs Abs: 1.9 10*3/uL (ref 1.0–3.6)
MCH: 29.1 pg (ref 26.0–34.0)
MCHC: 32.7 g/dL (ref 32.0–36.0)
MCV: 89 fL (ref 80.0–100.0)
MONOS PCT: 10 %
Monocytes Absolute: 0.7 10*3/uL (ref 0.2–0.9)
NEUTROS ABS: 4.1 10*3/uL (ref 1.4–6.5)
NEUTROS PCT: 59 %
Platelets: 304 10*3/uL (ref 150–440)
RBC: 3.88 MIL/uL (ref 3.80–5.20)
RDW: 15 % — ABNORMAL HIGH (ref 11.5–14.5)
WBC: 7 10*3/uL (ref 3.6–11.0)

## 2015-10-16 LAB — COMPREHENSIVE METABOLIC PANEL
ALBUMIN: 3.2 g/dL — AB (ref 3.5–5.0)
ALK PHOS: 232 U/L — AB (ref 38–126)
ALT: 38 U/L (ref 14–54)
AST: 34 U/L (ref 15–41)
Anion gap: 5 (ref 5–15)
BILIRUBIN TOTAL: 0.5 mg/dL (ref 0.3–1.2)
BUN: 23 mg/dL — AB (ref 6–20)
CALCIUM: 8.8 mg/dL — AB (ref 8.9–10.3)
CO2: 24 mmol/L (ref 22–32)
Chloride: 104 mmol/L (ref 101–111)
Creatinine, Ser: 2.27 mg/dL — ABNORMAL HIGH (ref 0.44–1.00)
GFR calc Af Amer: 22 mL/min — ABNORMAL LOW (ref >60)
GFR calc non Af Amer: 19 mL/min — ABNORMAL LOW (ref >60)
GLUCOSE: 153 mg/dL — AB (ref 65–99)
Potassium: 4.1 mmol/L (ref 3.5–5.1)
Sodium: 133 mmol/L — ABNORMAL LOW (ref 135–145)
TOTAL PROTEIN: 7 g/dL (ref 6.5–8.1)

## 2015-10-16 NOTE — Progress Notes (Signed)
[place] @ [facility] [phone]  Fax:(336) [place] OB: [birth year]  MR#: [id number]  SLH#:[id number]  Patient Care Team: [name], MD as PCP - General (Physician Assistant) [name], RN as Registered Nurse  CHIEF COMPLAINT:  Chief Complaint  Patient presents with  . Pancreatic Cancer  1.  Carcinoma of head of pancreas 23 mm millimeter by 17 mm invading superior  MESENTERIC VEIN.  No arterial invasion was found. T2 N0 M0 tumor by EUS criteria.  Biopsy was positive for adenocarcinoma. (February, 2017). 2.  Carcinoma of breast diagnosis was in August 25 2011.  T1 cN0 M0 tumor ER/PR positive. 3.  Multiple comorbid condition with chronic renal failure, diabetes, high blood pressure, obesity bilateral knee replacement and poor performance status    No history exists.    Oncology Flowsheet 09/01/2015 09/02/2015 09/03/2015 09/04/2015 09/05/2015 09/06/2015  letrozole (FEMARA) PO 2.5 mg 2.5 mg 2.5 mg 2.5 mg 2.5 mg 2.5 mg  ondansetron (ZOFRAN) IJ - - - - - -  ondansetron (ZOFRAN) IV - - - - 4 mg -  ondansetron (ZOFRAN) PO - - - 4 mg - -    INTERVAL HISTORY: 82 year old lady who is in wheelchair.  Patient was admitted in hospital for abdominal pain.  Nausea vomiting diarrhea underwent extensive workup included a noncontrast CT scan which revealed pancreatic mass.  Patient underwent EUS which was positive for adenocarcinoma and was stage TII N0 M0 tumor patient is here for further follow-up and consideration of treatment.  No nausea no vomiting.  Diarrhea has improved.  Patient has multiple comorbid condition.  As mentioned above [place] bilateral knee replacement, moderate obesity, in wheelchair walking with the help of walker.  Has a chronic renal failure, high blood pressure, diabetes,  REVIEW OF SYSTEMS:   Gen. status: Patient is here for further evaluation regarding pancreatic adenocarcinoma.   Overall poor performance status because of multiple  comorbid condition.   HEENT: Denies any headache no visual disturbances.   Lungs: Shortness of breath on exertion.   Cardiac: No chest pain.   GI: Patient had nausea vomiting epigastric discomfort diarrhea which is gradually improving.  No hematemesis.  No melena.   Lower extremities no swelling skin: No rash neurological system difficult to examine musculoskeletal system bilateral knee replacement  As per HPI. Otherwise, a complete review of systems is negatve.  PAST MEDICAL HISTORY: Past Medical History  Diagnosis Date  . Hypertension   . GERD (gastroesophageal reflux disease)   . Diabetes mellitus without complication ([place])   . Breast cancer ([place]) 2013    c Lumpectomy 2013  . Uterine cancer ([place]) 1960    PAST SURGICAL HISTORY: Past Surgical History  Procedure Laterality Date  . Abdominal hysterectomy    . Knee arthroscopy    . Ercp N/A 09/04/2015    Procedure: ENDOSCOPIC RETROGRADE CHOLANGIOPANCREATOGRAPHY (ERCP);  Surgeon: Hulen Luster, MD;  Location: [facility] ENDOSCOPY;  Service: Gastroenterology;  Laterality: N/A;  . Breast biopsy Right 2012    Positive  . Upper esophageal endoscopic ultrasound (eus) N/A 10/04/2015    Procedure: UPPER ESOPHAGEAL ENDOSCOPIC ULTRASOUND (EUS);  Surgeon: Holly Bodily, MD;  Location: [facility] ENDOSCOPY;  Service: Gastroenterology;  Laterality: N/A;    FAMILY HISTORY Family History  Problem Relation Age of Onset  . Cancer Mother   . Heart disease Brother     ADVANCED DIRECTIVES:  No flowsheet data found.  HEALTH MAINTENANCE: Social History  Substance Use Topics  .  Smoking status: Never Smoker   . Smokeless tobacco: None  . Alcohol Use: No      Allergies  Allergen Reactions  . Colchicine   . Ketorolac   . Lactase Diarrhea  . Lipitor [Atorvastatin]     Current Outpatient Prescriptions  Medication Sig Dispense Refill  . amLODipine (NORVASC) 10 MG tablet Take 10 mg by mouth daily.    [name] aspirin 81 MG tablet Take 81 mg by mouth  daily.    . febuxostat (ULORIC) 40 MG tablet Take 80 mg by mouth daily.    . furosemide (LASIX) 20 MG tablet Take 20 mg by mouth daily.    [name] glipiZIDE (GLUCOTROL) 5 MG tablet Take 5 mg by mouth daily before breakfast.     . insulin glargine (LANTUS) 100 UNIT/ML injection Inject 25 Units into the skin 2 (two) times daily.     [name] letrozole (FEMARA) 2.5 MG tablet Take 2.5 mg by mouth daily.    [name] levothyroxine (SYNTHROID, LEVOTHROID) 75 MCG tablet Take 75 mcg by mouth daily before breakfast.    . lisinopril (PRINIVIL,ZESTRIL) 40 MG tablet Take 40 mg by mouth daily.    [name] loperamide (IMODIUM) 2 MG capsule Take 1 capsule (2 mg total) by mouth every 6 (six) hours as needed for diarrhea or loose stools. 30 capsule 0  . metoprolol (LOPRESSOR) 50 MG tablet Take 50 mg by mouth daily.    [name] omeprazole (PRILOSEC) 20 MG capsule Take 20 mg by mouth daily.    . pravastatin (PRAVACHOL) 40 MG tablet Take 40 mg by mouth daily.     No current facility-administered medications for this visit.    OBJECTIVE:  Filed Vitals:   10/16/15 1408  BP: 147/65  Pulse: 65  Temp: 97.4 F (36.3 C)  Resp: 18     Body mass index is 42.16 kg/(m^2).    ECOG FS:2 - Symptomatic, <50% confined to bed  PHYSICAL EXAM: Patient is alert oriented not any acute distress.  Moderate obesity.  Patient is confined to the wheelchair. Lymphatic system: Supraclavicular, cervical, axillary, inguinal lymph nodes are not palpable. Lungs: Diminished air entry on both sides. Cardiac: Soft systolic murmur. Abdomen: Difficult to examine protuberant abdomen no ascites tenderness in epigastric area skin: No rash Neurological system: Complete examination is difficult.  There is no localizing signs HEENT: No abnormality detected Patient's ambulation is limited. All other systems have been examined.   LAB RESULTS:  CBC Latest Ref Rng 10/16/2015 09/01/2015  WBC 3.6 - 11.0 K/uL 7.0 6.4  Hemoglobin 12.0 - 16.0 g/dL 11.3(L) 11.3(L)  Hematocrit 35.0  - 47.0 % 34.6(L) 33.9(L)  Platelets 150 - 440 K/uL 304 233    Appointment on 10/16/2015  Component Date Value Ref Range Status  . WBC 10/16/2015 7.0  3.6 - 11.0 K/uL Final  . RBC 10/16/2015 3.88  3.80 - 5.20 MIL/uL Final  . Hemoglobin 10/16/2015 11.3* 12.0 - 16.0 g/dL Final  . HCT 10/16/2015 34.6* 35.0 - 47.0 % Final  . MCV 10/16/2015 89.0  80.0 - 100.0 fL Final  . MCH 10/16/2015 29.1  26.0 - 34.0 pg Final  . MCHC 10/16/2015 32.7  32.0 - 36.0 g/dL Final  . RDW 10/16/2015 15.0* 11.5 - 14.5 % Final  . Platelets 10/16/2015 304  150 - 440 K/uL Final  . Neutrophils Relative % 10/16/2015 59   Final  . Neutro Abs 10/16/2015 4.1  1.4 - 6.5 K/uL Final  . Lymphocytes Relative 10/16/2015 27   Final  . Lymphs  Abs 10/16/2015 1.9  1.0 - 3.6 K/uL Final  . Monocytes Relative 10/16/2015 10   Final  . Monocytes Absolute 10/16/2015 0.7  0.2 - 0.9 K/uL Final  . Eosinophils Relative 10/16/2015 3   Final  . Eosinophils Absolute 10/16/2015 0.2  0 - 0.7 K/uL Final  . Basophils Relative 10/16/2015 1   Final  . Basophils Absolute 10/16/2015 0.1  0 - 0.1 K/uL Final  . Sodium 10/16/2015 133* 135 - 145 mmol/L Final  . Potassium 10/16/2015 4.1  3.5 - 5.1 mmol/L Final  . Chloride 10/16/2015 104  101 - 111 mmol/L Final  . CO2 10/16/2015 24  22 - 32 mmol/L Final  . Glucose, Bld 10/16/2015 153* 65 - 99 mg/dL Final  . BUN 10/16/2015 23* 6 - 20 mg/dL Final  . Creatinine, Ser 10/16/2015 2.27* 0.44 - 1.00 mg/dL Final  . Calcium 10/16/2015 8.8* 8.9 - 10.3 mg/dL Final  . Total Protein 10/16/2015 7.0  6.5 - 8.1 g/dL Final  . Albumin 10/16/2015 3.2* 3.5 - 5.0 g/dL Final  . AST 10/16/2015 34  15 - 41 U/L Final  . ALT 10/16/2015 38  14 - 54 U/L Final  . Alkaline Phosphatase 10/16/2015 232* 38 - 126 U/L Final  . Total Bilirubin 10/16/2015 0.5  0.3 - 1.2 mg/dL Final  . GFR calc non Af Amer 10/16/2015 19* >60 mL/min Final  . GFR calc Af Amer 10/16/2015 22* >60 mL/min Final   Comment: (NOTE) The eGFR has been calculated  using the CKD EPI equation. This calculation has not been validated in all clinical situations. eGFR's persistently <60 mL/min signify possible Chronic Kidney Disease.   . Anion gap 10/16/2015 5  5 - 15 Final  . Magnesium 10/16/2015 1.7  1.7 - 2.4 mg/dL Final       STUDIES: No results found.  ASSESSMENT: Carcinoma of head of pancreas positive for adenocarcinoma EUS staging is T2 N0 M0 Multiple comorbid condition Renal failure will preclude any other evaluation for involvement of SMA. I doubt patient is a candidate for surgical intervention but we will run by this case with surgeon Appointment to see Dr. Donella Stade for possible radiation therapy Consider possibility of either 5-FU by continuous infusion or Xeloda along with radiation if tolerated Other option would be palliative approach and no further therapy  all these options have been discussed at length with the patient and her sister Case was discussed in tumor conference  Reevaluate patient after radiation oncologists evaluate to see whether radiation is a possibility or not. All x-rays have been reviewed independently and reviewed with the patient.  CT scan has been reviewed independently.  He has findings were reviewed.  Pathology was reviewed. Case was discussed in tumor conference  Patient expressed understanding and was in agreement with this plan. She also understands that She can call clinic at any time with any questions, concerns, or complaints.    No matching staging information was found for the patient.  Forest Gleason, MD   10/16/2015 4:14 PM

## 2015-10-16 NOTE — Progress Notes (Signed)
Patient has history of breast cancer. Here today for pancreatic mass.

## 2015-10-17 LAB — CANCER ANTIGEN 19-9: CA 19-9: 11 U/mL (ref 0–35)

## 2015-10-29 ENCOUNTER — Inpatient Hospital Stay: Payer: Medicare Other | Attending: Oncology | Admitting: Oncology

## 2015-10-29 ENCOUNTER — Other Ambulatory Visit: Payer: Self-pay | Admitting: *Deleted

## 2015-10-29 ENCOUNTER — Ambulatory Visit
Admission: RE | Admit: 2015-10-29 | Discharge: 2015-10-29 | Disposition: A | Discharge status: Home / Self Care | Payer: Medicare Other | Source: Ambulatory Visit | Attending: Radiation Oncology | Admitting: Radiation Oncology

## 2015-10-29 ENCOUNTER — Encounter: Payer: Self-pay | Admitting: Oncology

## 2015-10-29 ENCOUNTER — Encounter: Payer: Self-pay | Admitting: Radiation Oncology

## 2015-10-29 VITALS — BP 140/67 | HR 78 | Temp 97.8°F | Resp 18 | Wt 266.4 lb

## 2015-10-29 VITALS — BP 140/74 | HR 78 | Temp 98.4°F | Ht 68.0 in | Wt 266.5 lb

## 2015-10-29 DIAGNOSIS — K219 Gastro-esophageal reflux disease without esophagitis: Secondary | ICD-10-CM | POA: Insufficient documentation

## 2015-10-29 DIAGNOSIS — Z79899 Other long term (current) drug therapy: Secondary | ICD-10-CM | POA: Diagnosis not present

## 2015-10-29 DIAGNOSIS — R630 Anorexia: Secondary | ICD-10-CM | POA: Insufficient documentation

## 2015-10-29 DIAGNOSIS — Z8542 Personal history of malignant neoplasm of other parts of uterus: Secondary | ICD-10-CM | POA: Insufficient documentation

## 2015-10-29 DIAGNOSIS — E669 Obesity, unspecified: Secondary | ICD-10-CM | POA: Insufficient documentation

## 2015-10-29 DIAGNOSIS — R112 Nausea with vomiting, unspecified: Secondary | ICD-10-CM | POA: Insufficient documentation

## 2015-10-29 DIAGNOSIS — I129 Hypertensive chronic kidney disease with stage 1 through stage 4 chronic kidney disease, or unspecified chronic kidney disease: Secondary | ICD-10-CM | POA: Insufficient documentation

## 2015-10-29 DIAGNOSIS — C250 Malignant neoplasm of head of pancreas: Secondary | ICD-10-CM

## 2015-10-29 DIAGNOSIS — Z853 Personal history of malignant neoplasm of breast: Secondary | ICD-10-CM | POA: Diagnosis not present

## 2015-10-29 DIAGNOSIS — N189 Chronic kidney disease, unspecified: Secondary | ICD-10-CM | POA: Diagnosis not present

## 2015-10-29 DIAGNOSIS — Z794 Long term (current) use of insulin: Secondary | ICD-10-CM | POA: Insufficient documentation

## 2015-10-29 DIAGNOSIS — R0602 Shortness of breath: Secondary | ICD-10-CM | POA: Diagnosis not present

## 2015-10-29 DIAGNOSIS — E119 Type 2 diabetes mellitus without complications: Secondary | ICD-10-CM | POA: Insufficient documentation

## 2015-10-29 DIAGNOSIS — Z5111 Encounter for antineoplastic chemotherapy: Secondary | ICD-10-CM | POA: Insufficient documentation

## 2015-10-29 DIAGNOSIS — Z7982 Long term (current) use of aspirin: Secondary | ICD-10-CM | POA: Insufficient documentation

## 2015-10-29 DIAGNOSIS — R7989 Other specified abnormal findings of blood chemistry: Secondary | ICD-10-CM | POA: Insufficient documentation

## 2015-10-29 DIAGNOSIS — Z510 Encounter for antineoplastic radiation therapy: Secondary | ICD-10-CM | POA: Insufficient documentation

## 2015-10-29 MED ORDER — PROMETHAZINE HCL 25 MG PO TABS: 25.0000 mg | ORAL_TABLET | Freq: Four times a day (QID) | ORAL | Status: DC | PRN

## 2015-10-29 NOTE — Progress Notes (Signed)
  Oncology Nurse Navigator Documentation  Navigator Location: [facility] (10/29/15 1400) Navigator Encounter Type: Initial RadOnc (10/29/15 1400)           Patient Visit Type: ELMRAJ;Initial (10/29/15 1400)   Barriers/Navigation Needs: No barriers at this time (10/29/15 1400)                          Time Spent with Patient: 15 (10/29/15 1400)   Met with patient and her sister before consult with Dr Baruch Gouty. Introduced nurse navigator services and provided patient with contact information for any further questions or needs.

## 2015-10-29 NOTE — Consult Note (Signed)
Except an outstanding is perfect of Radiation Oncology NEW PATIENT EVALUATION  Name: Pamela Preston  MRN: [id number]  Date:   10/29/2015     DOB: 23-Nov-1929   This 82 y.o. 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  REFERRING PHYSICIAN: [name], MD  CHIEF COMPLAINT:  Chief Complaint  Patient presents with  . Cancer    Patient is here for initial consultation of pancreatic cancer.      DIAGNOSIS: The encounter diagnosis was Malignant neoplasm of head of pancreas ([place]).   PREVIOUS INVESTIGATIONS:  CT scans reviewed Pathology report reviewed Clinical notes reviewed  HPI: Patient is a 82 year old female who presented with scleral icterus abdominal pain and discomfort as well as diarrhea. She is multiple comorbidities including chronic renal failure adult onset diabetes hypertension morbid obesity. CT scan revealed a 2.3 cm mass in the head of the pancreas invading the superior mesenteric vein. By endoscopic ultrasound this was a T2 N0 lesion and biopsy was positive for adenocarcinoma consistent with pancreatic primary. Endoscopic ultrasound confirmed an irregular mass in the pancreatic head that was hypoechoic. Patient has been seen by medical oncology based on overall comorbidities poor performance status renal failure surgical option not entertained. She is now referred to radiation oncology for opinion. She continues to have some abdominal pain and diarrhea and is been plagued lately with nausea. I am starting her on anti-medic therapy.  PLANNED TREATMENT REGIMEN: I MRT radiation with concurrent chemotherapy  PAST MEDICAL HISTORY:  has a past medical history of Hypertension; GERD (gastroesophageal reflux disease); Diabetes mellitus without complication ([place]); Breast cancer ([place]) (2013); and Uterine cancer ([place]) (1960).    PAST SURGICAL HISTORY:  Past Surgical History  Procedure Laterality  Date  . Abdominal hysterectomy    . Knee arthroscopy    . Ercp N/A 09/04/2015    Procedure: ENDOSCOPIC RETROGRADE CHOLANGIOPANCREATOGRAPHY (ERCP);  Surgeon: Hulen Luster, MD;  Location: [facility] ENDOSCOPY;  Service: Gastroenterology;  Laterality: N/A;  . Breast biopsy Right 2012    Positive  . Upper esophageal endoscopic ultrasound (eus) N/A 10/04/2015    Procedure: UPPER ESOPHAGEAL ENDOSCOPIC ULTRASOUND (EUS);  Surgeon: Holly Bodily, MD;  Location: [facility] ENDOSCOPY;  Service: Gastroenterology;  Laterality: N/A;    FAMILY HISTORY: family history includes Cancer in her mother; Heart disease in her brother.  SOCIAL HISTORY:  reports that she has never smoked. She does not have any smokeless tobacco history on file. She reports that she does not drink alcohol or use illicit drugs.  ALLERGIES: Colchicine; Ketorolac; Lactase; and Lipitor  MEDICATIONS:  Current Outpatient Prescriptions  Medication Sig Dispense Refill  . amLODipine (NORVASC) 10 MG tablet Take 10 mg by mouth daily.    [name] aspirin 81 MG tablet Take 81 mg by mouth daily.    . febuxostat (ULORIC) 40 MG tablet Take 80 mg by mouth daily.    . furosemide (LASIX) 20 MG tablet Take 20 mg by mouth daily.    [name] glipiZIDE (GLUCOTROL) 5 MG tablet Take 5 mg by mouth daily before breakfast.     . insulin glargine (LANTUS) 100 UNIT/ML injection Inject 25 Units into the skin 2 (two) times daily.     [name] letrozole (FEMARA) 2.5 MG tablet Take 2.5 mg by mouth daily.    [name] levothyroxine (SYNTHROID, LEVOTHROID) 75 MCG tablet Take 75 mcg by mouth daily before breakfast.    . metoprolol (LOPRESSOR) 50 MG tablet  Take 50 mg by mouth daily.    [name] omeprazole (PRILOSEC) 20 MG capsule Take 20 mg by mouth daily.    . pravastatin (PRAVACHOL) 40 MG tablet Take 40 mg by mouth daily.    [name] lisinopril (PRINIVIL,ZESTRIL) 40 MG tablet Take 40 mg by mouth daily.    . promethazine (PHENERGAN) 25 MG tablet Take 1 tablet (25 mg total) by mouth every 6 (six) hours as needed for  nausea or vomiting. 30 tablet 1   No current facility-administered medications for this encounter.    ECOG PERFORMANCE STATUS:  2 - Symptomatic, <50% confined to bed  REVIEW OF SYSTEMS: Except for the nausea abdominal pain scleral icterus  Patient denies any weight loss, fatigue, weakness, fever, chills or night sweats. Patient denies any loss of vision, blurred vision. Patient denies any ringing  of the ears or hearing loss. No irregular heartbeat. Patient denies heart murmur or history of fainting. Patient denies any chest pain or pain radiating to her upper extremities. Patient denies any shortness of breath, difficulty breathing at night, cough or hemoptysis. Patient denies any swelling in the lower legs. Patient denies any nausea vomiting, vomiting of blood, or coffee ground material in the vomitus. Patient denies any stomach pain. Patient states has had normal bowel movements no significant constipation or diarrhea. Patient denies any dysuria, hematuria or significant nocturia. Patient denies any problems walking, swelling in the joints or loss of balance. Patient denies any skin changes, loss of hair or loss of weight. Patient denies any excessive worrying or anxiety or significant depression. Patient denies any problems with insomnia. Patient denies excessive thirst, polyuria, polydipsia. Patient denies any swollen glands, patient denies easy bruising or easy bleeding. Patient denies any recent infections, allergies or URI. Patient "s visual fields have not changed significantly in recent time.    PHYSICAL EXAM: BP 140/67 mmHg  Pulse 78  Temp(Src) 97.8 F (36.6 C)  Resp 18  Wt 266 lb 6.8 oz (120.85 kg) Well-developed wheelchair-bound female morbidly obese in NAD. She still has slight scleral icterus noted. Abdominal is benign. She has significant bilateral lower extremity edema 3/6.  Well-developed well-nourished patient in NAD. HEENT reveals PERLA, EOMI, discs not visualized.  Oral  cavity is clear. No oral mucosal lesions are identified. Neck is clear without evidence of cervical or supraclavicular adenopathy. Lungs are clear to A&P. Cardiac examination is essentially unremarkable with regular rate and rhythm without murmur rub or thrill. Abdomen is benign with no organomegaly or masses noted. Motor sensory and DTR levels are equal and symmetric in the upper and lower extremities. Cranial nerves II through XII are grossly intact. Proprioception is intact. No peripheral adenopathy or edema is identified. No motor or sensory levels are noted. Crude visual fields are within normal range.  LABORATORY DATA: Surgical pathology and cytology reports reviewed    RADIOLOGY RESULTS: CT scan reviewed   IMPRESSION: T2 N0 M0 adenocarcinoma the head of the pancreas in 82 year old female with multiple medical comorbidities  PLAN: At this time I agree with medical oncology patient best be treated with concurrent chemoradiation based on some. Mesenteric vein involvement as well as her multiple comorbidities. I will plan on delivering 5400 cGy using I am RT treatment planning and delivery. Risks and benefits of treatment including increased nausea possible diarrhea fatigue alteration of blood counts all were discussed in detail with the patient. I am starting the patient on Phenergan for nausea. I have set up and ordered CT simulation later this week. I have  discussed the case personally with medical oncology.  I would like to take this opportunity for allowing me to participate in the care of your patient.Armstead Peaks., MD

## 2015-10-30 ENCOUNTER — Encounter: Payer: Self-pay | Admitting: Oncology

## 2015-10-30 NOTE — Progress Notes (Signed)
[place] @ [facility] [phone]  Fax:(336) [place] OB: 12/08/1929  MR#: [id number]  TWS#:[id number]  Patient Care Team: [name], MD as PCP - General (Physician Assistant) [name], RN as Registered Nurse  CHIEF COMPLAINT:  Chief Complaint  Patient presents with  . Pancreatic Cancer  1.  Carcinoma of head of pancreas 23 mm millimeter by 17 mm invading superior  MESENTERIC VEIN.  No arterial invasion was found. T2 N0 M0 tumor by EUS criteria.  Biopsy was positive for adenocarcinoma. (February, 2017). 2.  Carcinoma of breast diagnosis was in August 25 2011.  T1 cN0 M0 tumor ER/PR positive. 3.  Multiple comorbid condition with chronic renal failure, diabetes, high blood pressure, obesity bilateral knee replacement and poor performance status    No history exists.    Oncology Flowsheet 09/01/2015 09/02/2015 09/03/2015 09/04/2015 09/05/2015 09/06/2015  letrozole (FEMARA) PO 2.5 mg 2.5 mg 2.5 mg 2.5 mg 2.5 mg 2.5 mg  ondansetron (ZOFRAN) IJ - - - - - -  ondansetron (ZOFRAN) IV - - - - 4 mg -  ondansetron (ZOFRAN) PO - - - 4 mg - -    INTERVAL HISTORY: 82 year old lady who is in wheelchair.  Patient was admitted in hospital for abdominal pain.  Nausea vomiting diarrhea underwent extensive workup included a noncontrast CT scan which revealed pancreatic mass.  Patient underwent EUS which was positive for adenocarcinoma and was stage TII N0 M0 tumor patient is here for further follow-up and consideration of treatment.  No nausea no vomiting.  Diarrhea has improved.  Patient has multiple comorbid condition.  As mentioned above [place] bilateral knee replacement, moderate obesity, in wheelchair walking with the help of walker.  Has a chronic renal failure, high blood pressure, diabetes, Patient could not get MRI scan because of significant renal insufficiency. Has been evaluated today by Dr. Baruch Gouty, our radiation oncologists and decided to pursue  radiation and chemotherapy as patient is not a candidate for surgical intervention based on multiple comorbid condition and patient does not desire surgery REVIEW OF SYSTEMS:   Gen. status: Patient is here for further evaluation regarding pancreatic adenocarcinoma.   Overall poor performance status because of multiple comorbid condition.   HEENT: Denies any headache no visual disturbances.   Lungs: Shortness of breath on exertion.   Cardiac: No chest pain.   GI: Patient had nausea vomiting epigastric discomfort diarrhea which is gradually improving.  No hematemesis.  No melena.   Lower extremities no swelling skin: No rash neurological system difficult to examine musculoskeletal system bilateral knee replacement  As per HPI. Otherwise, a complete review of systems is negatve.  PAST MEDICAL HISTORY: Past Medical History  Diagnosis Date  . Hypertension   . GERD (gastroesophageal reflux disease)   . Diabetes mellitus without complication ([place])   . Breast cancer ([place]) 2013    c Lumpectomy 2013  . Uterine cancer ([place]) 1960    PAST SURGICAL HISTORY: Past Surgical History  Procedure Laterality Date  . Abdominal hysterectomy    . Knee arthroscopy    . Ercp N/A 09/04/2015    Procedure: ENDOSCOPIC RETROGRADE CHOLANGIOPANCREATOGRAPHY (ERCP);  Surgeon: Hulen Luster, MD;  [facility] ENDOSCOPY;  Service: Gastroenterology;  Laterality: N/A;  . Breast biopsy Right 2012    Positive  . Upper esophageal endoscopic ultrasound (eus) N/A 10/04/2015    Procedure: UPPER ESOPHAGEAL ENDOSCOPIC ULTRASOUND (EUS);  Surgeon: Holly Bodily, MD;  Location: [facility] ENDOSCOPY;  Service: Gastroenterology;  Laterality: N/A;    FAMILY HISTORY Family History  Problem Relation Age of Onset  . Cancer Mother   . Heart disease Brother     ADVANCED DIRECTIVES:  No flowsheet data found.  HEALTH MAINTENANCE: Social History  Substance Use Topics  . Smoking status: Never Smoker   . Smokeless tobacco: None  .  Alcohol Use: No      Allergies  Allergen Reactions  . Colchicine   . Ketorolac   . Lactase Diarrhea  . Lipitor [Atorvastatin]     Current Outpatient Prescriptions  Medication Sig Dispense Refill  . amLODipine (NORVASC) 10 MG tablet Take 10 mg by mouth daily.    [name] aspirin 81 MG tablet Take 81 mg by mouth daily.    . febuxostat (ULORIC) 40 MG tablet Take 80 mg by mouth daily.    . furosemide (LASIX) 20 MG tablet Take 20 mg by mouth daily.    [name] glipiZIDE (GLUCOTROL) 5 MG tablet Take 5 mg by mouth daily before breakfast.     . insulin glargine (LANTUS) 100 UNIT/ML injection Inject 25 Units into the skin 2 (two) times daily.     [name] letrozole (FEMARA) 2.5 MG tablet Take 2.5 mg by mouth daily.    [name] levothyroxine (SYNTHROID, LEVOTHROID) 75 MCG tablet Take 75 mcg by mouth daily before breakfast.    . lisinopril (PRINIVIL,ZESTRIL) 40 MG tablet Take 40 mg by mouth daily.    . metoprolol (LOPRESSOR) 50 MG tablet Take 50 mg by mouth daily.    [name] omeprazole (PRILOSEC) 20 MG capsule Take 20 mg by mouth daily.    . pravastatin (PRAVACHOL) 40 MG tablet Take 40 mg by mouth daily.    . promethazine (PHENERGAN) 25 MG tablet Take 1 tablet (25 mg total) by mouth every 6 (six) hours as needed for nausea or vomiting. 30 tablet 1   No current facility-administered medications for this visit.    OBJECTIVE:  Filed Vitals:   10/29/15 1432  BP: 140/74  Pulse: 78  Temp: 98.4 F (36.9 C)     Body mass index is 40.54 kg/(m^2).    ECOG FS:2 - Symptomatic, <50% confined to bed  PHYSICAL EXAM: Patient is alert oriented not any acute distress.  Moderate obesity.  Patient is confined to the wheelchair. Lymphatic system: Supraclavicular, cervical, axillary, inguinal lymph nodes are not palpable. Lungs: Diminished air entry on both sides. Cardiac: Soft systolic murmur. Abdomen: Difficult to examine protuberant abdomen no ascites tenderness in epigastric area skin: No rash Neurological system: Complete  examination is difficult.  There is no localizing signs HEENT: No abnormality detected Patient's ambulation is limited. All other systems have been examined.   LAB RESULTS:  CBC Latest Ref Rng 10/16/2015 09/01/2015  WBC 3.6 - 11.0 K/uL 7.0 6.4  Hemoglobin 12.0 - 16.0 g/dL 11.3(L) 11.3(L)  Hematocrit 35.0 - 47.0 % 34.6(L) 33.9(L)  Platelets 150 - 440 K/uL 304 233    No visits with results within 5 Day(s) from this visit. Latest known visit with results is:  Appointment on 10/16/2015  Component Date Value Ref Range Status  . WBC 10/16/2015 7.0  3.6 - 11.0 K/uL Final  . RBC 10/16/2015 3.88  3.80 - 5.20 MIL/uL Final  . Hemoglobin 10/16/2015 11.3* 12.0 - 16.0 g/dL Final  . HCT 10/16/2015 34.6* 35.0 - 47.0 % Final  . MCV 10/16/2015 89.0  80.0 - 100.0 fL Final  . MCH 10/16/2015 29.1  26.0 - 34.0 pg Final  . MCHC 10/16/2015 32.7  32.0 - 36.0 g/dL Final  . RDW 10/16/2015 15.0* 11.5 - 14.5 % Final  . Platelets 10/16/2015 304  150 - 440 K/uL Final  . Neutrophils Relative % 10/16/2015 59   Final  . Neutro Abs 10/16/2015 4.1  1.4 - 6.5 K/uL Final  . Lymphocytes Relative 10/16/2015 27   Final  . Lymphs Abs 10/16/2015 1.9  1.0 - 3.6 K/uL Final  . Monocytes Relative 10/16/2015 10   Final  . Monocytes Absolute 10/16/2015 0.7  0.2 - 0.9 K/uL Final  . Eosinophils Relative 10/16/2015 3   Final  . Eosinophils Absolute 10/16/2015 0.2  0 - 0.7 K/uL Final  . Basophils Relative 10/16/2015 1   Final  . Basophils Absolute 10/16/2015 0.1  0 - 0.1 K/uL Final  . Sodium 10/16/2015 133* 135 - 145 mmol/L Final  . Potassium 10/16/2015 4.1  3.5 - 5.1 mmol/L Final  . Chloride 10/16/2015 104  101 - 111 mmol/L Final  . CO2 10/16/2015 24  22 - 32 mmol/L Final  . Glucose, Bld 10/16/2015 153* 65 - 99 mg/dL Final  . BUN 10/16/2015 23* 6 - 20 mg/dL Final  . Creatinine, Ser 10/16/2015 2.27* 0.44 - 1.00 mg/dL Final  . Calcium 10/16/2015 8.8* 8.9 - 10.3 mg/dL Final  . Total Protein 10/16/2015 7.0  6.5 - 8.1 g/dL  Final  . Albumin 10/16/2015 3.2* 3.5 - 5.0 g/dL Final  . AST 10/16/2015 34  15 - 41 U/L Final  . ALT 10/16/2015 38  14 - 54 U/L Final  . Alkaline Phosphatase 10/16/2015 232* 38 - 126 U/L Final  . Total Bilirubin 10/16/2015 0.5  0.3 - 1.2 mg/dL Final  . GFR calc non Af Amer 10/16/2015 19* >60 mL/min Final  . GFR calc Af Amer 10/16/2015 22* >60 mL/min Final   Comment: (NOTE) The eGFR has been calculated using the CKD EPI equation. This calculation has not been validated in all clinical situations. eGFR's persistently <60 mL/min signify possible Chronic Kidney Disease.   . Anion gap 10/16/2015 5  5 - 15 Final  . Magnesium 10/16/2015 1.7  1.7 - 2.4 mg/dL Final  . CA 19-9 10/16/2015 11  0 - 35 U/mL Final   Comment: (NOTE) Roche ECLIA methodology Performed At: [facility] [address] [place], [place] [id number] Lindon Romp MD [id number]        STUDIES: No results found.  ASSESSMENT: Carcinoma of head of pancreas positive for adenocarcinoma EUS staging is T2 N0 M0 Multiple comorbid condition Renal failure will preclude any other evaluation for involvement of SMA.  Patient is not a candidate for surgical intervention because of multiple comorbid conditions Proceed with the radiation and 5-FU continuous infusion chemotherapy Proceed with port placement and reevaluate patient for consideration of 5-FU chemotherapy  Patient expressed understanding and was in agreement with this plan. She also understands that She can call clinic at any time with any questions, concerns, or complaints.    No matching staging information was found for the patient.  Forest Gleason, MD   10/30/2015 8:14 AM

## 2015-10-30 NOTE — Patient Instructions (Signed)
Fluorouracil, 5-FU injection What is this medicine? FLUOROURACIL, 5-FU (flure oh YOOR a sil) is a chemotherapy drug. It slows the growth of cancer cells. This medicine is used to treat many types of cancer like breast cancer, colon or rectal cancer, pancreatic cancer, and stomach cancer. This medicine may be used for other purposes; ask your health care provider or pharmacist if you have questions. What should I tell my health care provider before I take this medicine? They need to know if you have any of these conditions: -blood disorders -dihydropyrimidine dehydrogenase (DPD) deficiency -infection (especially a virus infection such as chickenpox, cold sores, or herpes) -kidney disease -liver disease -malnourished, poor nutrition -recent or ongoing radiation therapy -an unusual or allergic reaction to fluorouracil, other chemotherapy, other medicines, foods, dyes, or preservatives -pregnant or trying to get pregnant -breast-feeding How should I use this medicine? This drug is given as an infusion or injection into a vein. It is administered in a hospital or clinic by a specially trained health care professional. Talk to your pediatrician regarding the use of this medicine in children. Special care may be needed. Overdosage: If you think you have taken too much of this medicine contact a poison control center or emergency room at once. NOTE: This medicine is only for you. Do not share this medicine with others. What if I miss a dose? It is important not to miss your dose. Call your doctor or health care professional if you are unable to keep an appointment. What may interact with this medicine? -allopurinol -cimetidine -dapsone -digoxin -hydroxyurea -leucovorin -levamisole -medicines for seizures like ethotoin, fosphenytoin, phenytoin -medicines to increase blood counts like filgrastim, pegfilgrastim, sargramostim -medicines that treat or prevent blood clots like warfarin,  enoxaparin, and dalteparin -methotrexate -metronidazole -pyrimethamine -some other chemotherapy drugs like busulfan, cisplatin, estramustine, vinblastine -trimethoprim -trimetrexate -vaccines Talk to your doctor or health care professional before taking any of these medicines: -acetaminophen -aspirin -ibuprofen -ketoprofen -naproxen This list may not describe all possible interactions. Give your health care provider a list of all the medicines, herbs, non-prescription drugs, or dietary supplements you use. Also tell them if you smoke, drink alcohol, or use illegal drugs. Some items may interact with your medicine. What should I watch for while using this medicine? Visit your doctor for checks on your progress. This drug may make you feel generally unwell. This is not uncommon, as chemotherapy can affect healthy cells as well as cancer cells. Report any side effects. Continue your course of treatment even though you feel ill unless your doctor tells you to stop. In some cases, you may be given additional medicines to help with side effects. Follow all directions for their use. Call your doctor or health care professional for advice if you get a fever, chills or sore throat, or other symptoms of a cold or flu. Do not treat yourself. This drug decreases your body's ability to fight infections. Try to avoid being around people who are sick. This medicine may increase your risk to bruise or bleed. Call your doctor or health care professional if you notice any unusual bleeding. Be careful brushing and flossing your teeth or using a toothpick because you may get an infection or bleed more easily. If you have any dental work done, tell your dentist you are receiving this medicine. Avoid taking products that contain aspirin, acetaminophen, ibuprofen, naproxen, or ketoprofen unless instructed by your doctor. These medicines may hide a fever. Do not become pregnant while taking this medicine. Women should    inform their doctor if they wish to become pregnant or think they might be pregnant. There is a potential for serious side effects to an unborn child. Talk to your health care professional or pharmacist for more information. Do not breast-feed an infant while taking this medicine. Men should inform their doctor if they wish to father a child. This medicine may lower sperm counts. Do not treat diarrhea with over the counter products. Contact your doctor if you have diarrhea that lasts more than 2 days or if it is severe and watery. This medicine can make you more sensitive to the sun. Keep out of the sun. If you cannot avoid being in the sun, wear protective clothing and use sunscreen. Do not use sun lamps or tanning beds/booths. What side effects may I notice from receiving this medicine? Side effects that you should report to your doctor or health care professional as soon as possible: -allergic reactions like skin rash, itching or hives, swelling of the face, lips, or tongue -low blood counts - this medicine may decrease the number of white blood cells, red blood cells and platelets. You may be at increased risk for infections and bleeding. -signs of infection - fever or chills, cough, sore throat, pain or difficulty passing urine -signs of decreased platelets or bleeding - bruising, pinpoint red spots on the skin, black, tarry stools, blood in the urine -signs of decreased red blood cells - unusually weak or tired, fainting spells, lightheadedness -breathing problems -changes in vision -chest pain -mouth sores -nausea and vomiting -pain, swelling, redness at site where injected -pain, tingling, numbness in the hands or feet -redness, swelling, or sores on hands or feet -stomach pain -unusual bleeding Side effects that usually do not require medical attention (report to your doctor or health care professional if they continue or are bothersome): -changes in finger or toe  nails -diarrhea -dry or itchy skin -hair loss -headache -loss of appetite -sensitivity of eyes to the light -stomach upset -unusually teary eyes This list may not describe all possible side effects. Call your doctor for medical advice about side effects. You may report side effects to FDA at 1-800-FDA-1088. Where should I keep my medicine? This drug is given in a hospital or clinic and will not be stored at home. NOTE: This sheet is a summary. It may not cover all possible information. If you have questions about this medicine, talk to your doctor, pharmacist, or health care provider.    2016, Elsevier/Gold Standard. (2007-12-08 13:53:16)  

## 2015-10-31 ENCOUNTER — Ambulatory Visit
Admission: RE | Admit: 2015-10-31 | Discharge: 2015-10-31 | Disposition: A | Discharge status: Home / Self Care | Payer: Medicare Other | Source: Ambulatory Visit | Attending: Radiation Oncology | Admitting: Radiation Oncology

## 2015-10-31 ENCOUNTER — Other Ambulatory Visit: Payer: Self-pay | Admitting: Vascular Surgery

## 2015-10-31 DIAGNOSIS — C250 Malignant neoplasm of head of pancreas: Secondary | ICD-10-CM | POA: Diagnosis present

## 2015-10-31 DIAGNOSIS — Z510 Encounter for antineoplastic radiation therapy: Secondary | ICD-10-CM | POA: Diagnosis not present

## 2015-11-01 ENCOUNTER — Inpatient Hospital Stay: Payer: Medicare Other

## 2015-11-01 ENCOUNTER — Institutional Professional Consult (permissible substitution): Payer: Medicare Other | Admitting: Radiation Oncology

## 2015-11-01 ENCOUNTER — Inpatient Hospital Stay: Admission: RE | Admit: 2015-11-01 | Payer: Medicare Other | Source: Ambulatory Visit | Admitting: Radiation Oncology

## 2015-11-01 ENCOUNTER — Telehealth: Payer: Self-pay | Admitting: *Deleted

## 2015-11-01 MED ORDER — LIDOCAINE-PRILOCAINE 2.5-2.5 % EX CREA: 1.0000 "application " | TOPICAL_CREAM | CUTANEOUS | Status: DC | PRN

## 2015-11-01 NOTE — Telephone Encounter (Signed)
emla cream has been escribed to pharmacy.

## 2015-11-01 NOTE — Telephone Encounter (Signed)
-----   Message from Leona Singleton, RN sent at 11/01/2015 12:47 PM EDT ----- Regarding: EMLA cream [name],  Can you call in EMLA cream for this patient?  Thanks, [employer]

## 2015-11-02 ENCOUNTER — Ambulatory Visit: Payer: Medicare Other

## 2015-11-05 ENCOUNTER — Ambulatory Visit
Admission: RE | Admit: 2015-11-05 | Discharge: 2015-11-05 | Disposition: A | Discharge status: Home / Self Care | Payer: Medicare Other | Source: Ambulatory Visit | Attending: Vascular Surgery | Admitting: Vascular Surgery

## 2015-11-05 ENCOUNTER — Encounter
Admission: RE | Disposition: A | Discharge status: Home / Self Care | Payer: Self-pay | Source: Ambulatory Visit | Attending: Vascular Surgery

## 2015-11-05 DIAGNOSIS — Z8542 Personal history of malignant neoplasm of other parts of uterus: Secondary | ICD-10-CM | POA: Insufficient documentation

## 2015-11-05 DIAGNOSIS — C259 Malignant neoplasm of pancreas, unspecified: Secondary | ICD-10-CM | POA: Diagnosis present

## 2015-11-05 DIAGNOSIS — K219 Gastro-esophageal reflux disease without esophagitis: Secondary | ICD-10-CM | POA: Diagnosis not present

## 2015-11-05 DIAGNOSIS — Z9221 Personal history of antineoplastic chemotherapy: Secondary | ICD-10-CM | POA: Insufficient documentation

## 2015-11-05 DIAGNOSIS — I129 Hypertensive chronic kidney disease with stage 1 through stage 4 chronic kidney disease, or unspecified chronic kidney disease: Secondary | ICD-10-CM | POA: Insufficient documentation

## 2015-11-05 DIAGNOSIS — N184 Chronic kidney disease, stage 4 (severe): Secondary | ICD-10-CM | POA: Insufficient documentation

## 2015-11-05 DIAGNOSIS — Z853 Personal history of malignant neoplasm of breast: Secondary | ICD-10-CM | POA: Insufficient documentation

## 2015-11-05 DIAGNOSIS — E1122 Type 2 diabetes mellitus with diabetic chronic kidney disease: Secondary | ICD-10-CM | POA: Insufficient documentation

## 2015-11-05 HISTORY — PX: PERIPHERAL VASCULAR CATHETERIZATION: SHX172C

## 2015-11-05 SURGERY — PORTA CATH INSERTION
Anesthesia: Moderate Sedation

## 2015-11-05 MED ORDER — MIDAZOLAM HCL 5 MG/5ML IJ SOLN
INTRAMUSCULAR | Status: AC
  Filled 2015-11-05: qty 5

## 2015-11-05 MED ORDER — GENTAMICIN SULFATE 40 MG/ML IJ SOLN
INTRAMUSCULAR | Status: AC
  Administered 2015-11-05: 10:00:00
  Filled 2015-11-05: qty 2

## 2015-11-05 MED ORDER — DEXTROSE 5 % IV SOLN
1.5000 g | INTRAVENOUS | Status: AC
  Administered 2015-11-05: 1.5 g via INTRAVENOUS

## 2015-11-05 MED ORDER — ONDANSETRON HCL 4 MG/2ML IJ SOLN: 4.0000 mg | Freq: Four times a day (QID) | INTRAMUSCULAR | Status: DC | PRN

## 2015-11-05 MED ORDER — SODIUM CHLORIDE 0.9 % IV SOLN: 500.0000 mL | Freq: Once | INTRAVENOUS | Status: DC | PRN

## 2015-11-05 MED ORDER — HEPARIN (PORCINE) IN NACL 2-0.9 UNIT/ML-% IJ SOLN
INTRAMUSCULAR | Status: AC
  Filled 2015-11-05: qty 500

## 2015-11-05 MED ORDER — LIDOCAINE-EPINEPHRINE (PF) 1 %-1:200000 IJ SOLN
INTRAMUSCULAR | Status: AC
  Filled 2015-11-05: qty 30

## 2015-11-05 MED ORDER — SODIUM CHLORIDE 0.9 % IV SOLN
INTRAVENOUS | Status: DC
  Administered 2015-11-05: 08:00:00 via INTRAVENOUS

## 2015-11-05 MED ORDER — FENTANYL CITRATE (PF) 100 MCG/2ML IJ SOLN
INTRAMUSCULAR | Status: DC | PRN
  Administered 2015-11-05 (×2): 50 ug via INTRAVENOUS

## 2015-11-05 MED ORDER — MIDAZOLAM HCL 2 MG/2ML IJ SOLN
INTRAMUSCULAR | Status: DC | PRN
  Administered 2015-11-05: 2 mg via INTRAVENOUS
  Administered 2015-11-05: 1 mg via INTRAVENOUS

## 2015-11-05 MED ORDER — HYDROMORPHONE HCL 1 MG/ML IJ SOLN: 1.0000 mg | Freq: Once | INTRAMUSCULAR | Status: DC

## 2015-11-05 MED ORDER — LIDOCAINE-EPINEPHRINE (PF) 1 %-1:200000 IJ SOLN
INTRAMUSCULAR | Status: DC | PRN
  Administered 2015-11-05: 20 mL via INTRADERMAL

## 2015-11-05 MED ORDER — FENTANYL CITRATE (PF) 100 MCG/2ML IJ SOLN
INTRAMUSCULAR | Status: AC
  Filled 2015-11-05: qty 2

## 2015-11-05 SURGICAL SUPPLY — 10 items
BAG DECANTER STRL (MISCELLANEOUS) ×3 IMPLANT
KIT PORT POWER 8FR ISP CVUE (Catheter) ×3 IMPLANT
PACK ANGIOGRAPHY (CUSTOM PROCEDURE TRAY) ×3 IMPLANT
PAD GROUND ADULT SPLIT (MISCELLANEOUS) ×3 IMPLANT
PENCIL ELECTRO HAND CTR (MISCELLANEOUS) ×3 IMPLANT
PREP CHG 10.5 TEAL (MISCELLANEOUS) ×3 IMPLANT
SUT MNCRL AB 4-0 PS2 18 (SUTURE) ×3 IMPLANT
SUT PROLENE 0 CT 1 30 (SUTURE) ×3 IMPLANT
SUTURE VIC 3-0 (SUTURE) ×3 IMPLANT
TOWEL OR 17X26 4PK STRL BLUE (TOWEL DISPOSABLE) ×3 IMPLANT

## 2015-11-05 NOTE — H&P (Signed)
[facility] Admission History & Physical  MRN : [id number]  Pamela Preston is a 82 y.o. (05-23-1930) female who presents with chief complaint of No chief complaint on file. [name]  History of Present Illness: Patient presents with pancreatic cancer and a request for [place] to see her for port placement from her oncologist, Dr. Oliva Bustard.  She has had problems with abdominal pain including reflux symptoms and was found to have a pancreatic mass. Her symptoms have been told and worsening over several months. She has poor venous access have a difficult time getting chemotherapy with port placement. She also has stage IV chronic kidney disease and may need her superficial veins for fistula placement in the future. She has no fevers or chills or signs systemic infection. She has no other complaints today.  Current Facility-Administered Medications  Medication Dose Route Frequency Provider Last Rate Last Dose  . 0.9 %  sodium chloride infusion   Intravenous Continuous Janalyn Harder Stegmayer, PA-C 10 mL/hr at 11/05/15 [phone]    . cefUROXime (ZINACEF) 1.5 g in dextrose 5 % 50 mL IVPB  1.5 g Intravenous 30 min Pre-Op Kimberly A Stegmayer, PA-C      . gentamicin (GARAMYCIN) 80 mg in sodium chloride irrigation 0.9 % 500 mL irrigation   Irrigation On Call Algernon Huxley, MD      . HYDROmorphone (DILAUDID) injection 1 mg  1 mg Intravenous Once [employer], PA-C      . ondansetron (ZOFRAN) injection 4 mg  4 mg Intravenous Q6H PRN Sela Hua, PA-C        Past Medical History  Diagnosis Date  . Hypertension   . GERD (gastroesophageal reflux disease)   . Diabetes mellitus without complication ([place])   . Breast cancer ([place]) 2013    c Lumpectomy 2013  . Uterine cancer ([place]) 1960    Past Surgical History  Procedure Laterality Date  . Abdominal hysterectomy    . Knee arthroscopy    . Ercp N/A 09/04/2015    Procedure: ENDOSCOPIC RETROGRADE CHOLANGIOPANCREATOGRAPHY (ERCP);   Surgeon: Hulen Luster, MD;  Location[facility];  Service: Gastroenterology;  Laterality: N/A;  . Breast biopsy Right 2012    Positive  . Upper esophageal endoscopic ultrasound (eus) N/A 10/04/2015    Procedure: UPPER ESOPHAGEAL ENDOSCOPIC ULTRASOUND (EUS);  Surgeon: Holly Bodily, MD;  Location: [facility] ENDOSCOPY;  Service: Gastroenterology;  Laterality: N/A;    Social History Social History  Substance Use Topics  . Smoking status: Never Smoker   . Smokeless tobacco: None  . Alcohol Use: No  no IVDU  Family History Family History  Problem Relation Age of Onset  . Cancer Mother   . Heart disease Brother   no bleeding or clotting disorders  Allergies  Allergen Reactions  . Colchicine   . Ketorolac   . Lactase Diarrhea  . Lipitor [Atorvastatin]      REVIEW OF SYSTEMS (Negative unless checked)  Constitutional: [] Weight loss  [] Fever  [] Chills Cardiac: [] Chest pain   [] Chest pressure   [] Palpitations   [] Shortness of breath when laying flat   [] Shortness of breath at rest   [] Shortness of breath with exertion. Vascular:  [] Pain in legs with walking   [] Pain in legs at rest   [] Pain in legs when laying flat   [] Claudication   [] Pain in feet when walking  [] Pain in feet at rest  [] Pain in feet when laying flat   [] History of DVT   []   Phlebitis   [x] Swelling in legs   [] Varicose veins   [] Non-healing ulcers Pulmonary:   [] Uses home oxygen   [] Productive cough   [] Hemoptysis   [] Wheeze  [] COPD   [] Asthma Neurologic:  [] Dizziness  [] Blackouts   [] Seizures   [] History of stroke   [] History of TIA  [] Aphasia   [] Temporary blindness   [] Dysphagia   [] Weakness or numbness in arms   [] Weakness or numbness in legs Musculoskeletal:  [] Arthritis   [] Joint swelling   [] Joint pain   [] Low back pain Hematologic:  [] Easy bruising  [] Easy bleeding   [] Hypercoagulable state   [] Anemic  [] Hepatitis Gastrointestinal:  [] Blood in stool   [] Vomiting blood  [x] Gastroesophageal reflux/heartburn    [] Difficulty swallowing. Genitourinary:  [x] Chronic kidney disease   [] Difficult urination  [] Frequent urination  [] Burning with urination   [] Blood in urine Skin:  [] Rashes   [] Ulcers   [] Wounds Psychological:  [] History of anxiety   []  History of major depression.  Physical Examination  Filed Vitals:   11/05/15 0753 11/05/15 0804  BP: 147/70   Pulse: 82   Temp: 98 F (36.7 C)   Resp: 20   Height: 5\' 8"  (1.727 m)   Weight: 120.203 kg (265 lb)   SpO2:  99%   Body mass index is 40.3 kg/(m^2). Gen: WD/WN, obese NAD Head: [place]/AT, No temporalis wasting. Prominent temp pulse not noted. Ear/Nose/Throat: Hearing grossly intact, nares w/o erythema or drainage, oropharynx w/o Erythema/Exudate,  Eyes: PERRLA, EOMI.  Neck: Supple, no nuchal rigidity.  No JVD.  Pulmonary:  Good air movement, equal bilaterally, no use of accessory muscles.  Cardiac: RRR, normal S1, S2 Vascular:  Vessel Right Left  Radial Palpable Palpable                                   Gastrointestinal: soft, non-tender/non-distended. No guarding/reflex.  Musculoskeletal: M/S 5/5 throughout.  Extremities without ischemic changes.  No deformity or atrophy.  Neurologic: CN 2-12 intact. Pain and light touch intact in extremities.  Symmetrical.  Speech is fluent. Motor exam as listed above. Psychiatric: Judgment intact, Mood & affect appropriate for pt's clinical situation. Dermatologic: No rashes or ulcers noted.  No cellulitis or open wounds. Lymph : No Cervical, Axillary, or Inguinal lymphadenopathy.    CBC Lab Results  Component Value Date   WBC 7.0 10/16/2015   HGB 11.3* 10/16/2015   HCT 34.6* 10/16/2015   MCV 89.0 10/16/2015   PLT 304 10/16/2015    BMET    Component Value Date/Time   NA 133* 10/16/2015 1459   NA 138 11/19/2013 0822   K 4.1 10/16/2015 1459   K 3.8 11/19/2013 0822   CL 104 10/16/2015 1459   CL 103 11/19/2013 0822   CO2 24 10/16/2015 1459   CO2 30 11/19/2013 0822   GLUCOSE 153*  10/16/2015 1459   GLUCOSE 126* 11/19/2013 0822   BUN 23* 10/16/2015 1459   BUN 18 11/19/2013 0822   CREATININE 2.27* 10/16/2015 1459   CREATININE 2.31* 11/19/2013 0822   CALCIUM 8.8* 10/16/2015 1459   CALCIUM 8.7 11/19/2013 0822   GFRNONAA 19* 10/16/2015 1459   GFRNONAA 19* 11/19/2013 0822   GFRAA 22* 10/16/2015 1459   GFRAA 22* 11/19/2013 [id number]   Estimated Creatinine Clearance: 25.6 mL/min (by C-G formula based on Cr of 2.27).  COAG No results found for: INR, PROTIME  Radiology No results found.    Assessment/Plan 1. Pancreatic cancer.  His Port-A-Cath for chemotherapy and durable venous access. We'll place today 2. Chronic kidney disease. Stage IV. Quite severe. Will not use any dye today unless absolutely necessary. 3. Diabetes. On outpatient medications.   Amor Hyle, MD  11/05/2015 8:16 AM

## 2015-11-05 NOTE — Op Note (Signed)
      [facility]       Operative Note  Date: 11/05/2015  Preoperative diagnosis:  1. Pancreatic cancer  Postoperative diagnosis:  Same as above  Procedures: #1. Ultrasound guidance for vascular access to the right internal jugular vein. #2. Fluoroscopic guidance for placement of catheter. #3. Placement of CT compatible Port-A-Cath, right internal jugular vein.  Surgeon: Leotis Pain, MD.   Anesthesia: Local with moderate conscious sedation for approximately 30  minutes using 3 mg of Versed and 100 mcg of Fentanyl  Fluoroscopy time: less than 1 minute  Contrast used: 0  Estimated blood loss: 25 cc  Indication for the procedure:  The patient is a 82 y.o.female with pancreatic cancer.  The patient needs a Port-A-Cath for durable venous access, chemotherapy, lab draws, and CT scans. We are asked to place this. Risks and benefits were discussed and informed consent was obtained.  Description of procedure: The patient was brought to the vascular and interventional radiology suite.  Moderate conscious sedation was administered throughout the procedure with my supervision of the RN administering medicines and monitoring the patient's vital signs, pulse oximetry, telemetry and mental status throughout from the start of the procedure until the patient was taken to the recovery room. The right neck chest and shoulder were sterilely prepped and draped, and a sterile surgical field was created. Ultrasound was used to help visualize a patent right internal jugular vein. This was then accessed under direct ultrasound guidance without difficulty with the Seldinger needle and a permanent image was recorded. A J-wire was placed. After skin nick and dilatation, the peel-away sheath was then placed over the wire. I then anesthetized an area under the clavicle approximately 1-2 fingerbreadths. A transverse incision was created and an inferior pocket was created with electrocautery and blunt  dissection. The port was then brought onto the field, placed into the pocket and secured to the chest wall with 2 Prolene sutures. The catheter was connected to the port and tunneled from the subclavicular incision to the access site. Fluoroscopic guidance was then used to cut the catheter to an appropriate length. The catheter was then placed through the peel-away sheath and the peel-away sheath was removed. The catheter tip was parked in excellent location under fluorocoscopic guidance in the area of the cavoatrial junction. The pocket was then irrigated with antibiotic impregnated saline and the wound was closed with a running 3-0 Vicryl and a 4-0 Monocryl. The access incision was closed with a single 4-0 Monocryl. The Huber needle was used to withdraw blood and flush the port with heparinized saline. Dermabond was then placed as a dressing. The patient tolerated the procedure well and was taken to the recovery room in stable condition.   DEW,JASON 11/05/2015 9:51 AM

## 2015-11-05 NOTE — Discharge Instructions (Signed)

## 2015-11-06 ENCOUNTER — Encounter: Payer: Self-pay | Admitting: Vascular Surgery

## 2015-11-07 ENCOUNTER — Ambulatory Visit: Admission: RE | Admit: 2015-11-07 | Payer: Medicare Other | Source: Ambulatory Visit | Admitting: Gastroenterology

## 2015-11-07 ENCOUNTER — Encounter: Admission: RE | Payer: Self-pay | Source: Ambulatory Visit

## 2015-11-07 ENCOUNTER — Ambulatory Visit: Payer: Medicare Other | Admitting: Oncology

## 2015-11-07 SURGERY — ERCP, WITH INTERVENTION IF INDICATED
Anesthesia: General

## 2015-11-08 ENCOUNTER — Inpatient Hospital Stay: Payer: Medicare Other

## 2015-11-08 ENCOUNTER — Inpatient Hospital Stay (HOSPITAL_BASED_OUTPATIENT_CLINIC_OR_DEPARTMENT_OTHER): Payer: Medicare Other | Admitting: Oncology

## 2015-11-08 ENCOUNTER — Encounter: Payer: Self-pay | Admitting: Oncology

## 2015-11-08 VITALS — BP 134/76 | HR 84 | Temp 97.6°F | Resp 18 | Wt 264.2 lb

## 2015-11-08 DIAGNOSIS — Z7982 Long term (current) use of aspirin: Secondary | ICD-10-CM

## 2015-11-08 DIAGNOSIS — Z8542 Personal history of malignant neoplasm of other parts of uterus: Secondary | ICD-10-CM

## 2015-11-08 DIAGNOSIS — C250 Malignant neoplasm of head of pancreas: Secondary | ICD-10-CM

## 2015-11-08 DIAGNOSIS — E669 Obesity, unspecified: Secondary | ICD-10-CM

## 2015-11-08 DIAGNOSIS — Z853 Personal history of malignant neoplasm of breast: Secondary | ICD-10-CM

## 2015-11-08 DIAGNOSIS — Z794 Long term (current) use of insulin: Secondary | ICD-10-CM

## 2015-11-08 DIAGNOSIS — R0602 Shortness of breath: Secondary | ICD-10-CM

## 2015-11-08 DIAGNOSIS — K219 Gastro-esophageal reflux disease without esophagitis: Secondary | ICD-10-CM

## 2015-11-08 DIAGNOSIS — Z79899 Other long term (current) drug therapy: Secondary | ICD-10-CM | POA: Diagnosis not present

## 2015-11-08 DIAGNOSIS — E119 Type 2 diabetes mellitus without complications: Secondary | ICD-10-CM

## 2015-11-08 DIAGNOSIS — I129 Hypertensive chronic kidney disease with stage 1 through stage 4 chronic kidney disease, or unspecified chronic kidney disease: Secondary | ICD-10-CM

## 2015-11-08 DIAGNOSIS — R112 Nausea with vomiting, unspecified: Secondary | ICD-10-CM

## 2015-11-08 LAB — COMPREHENSIVE METABOLIC PANEL
ALBUMIN: 2.9 g/dL — AB (ref 3.5–5.0)
ALK PHOS: 786 U/L — AB (ref 38–126)
ALT: 283 U/L — ABNORMAL HIGH (ref 14–54)
ANION GAP: 7 (ref 5–15)
AST: 437 U/L — ABNORMAL HIGH (ref 15–41)
BUN: 20 mg/dL (ref 6–20)
CALCIUM: 8.7 mg/dL — AB (ref 8.9–10.3)
CHLORIDE: 104 mmol/L (ref 101–111)
CO2: 27 mmol/L (ref 22–32)
Creatinine, Ser: 1.91 mg/dL — ABNORMAL HIGH (ref 0.44–1.00)
GFR calc Af Amer: 27 mL/min — ABNORMAL LOW (ref >60)
GFR calc non Af Amer: 23 mL/min — ABNORMAL LOW (ref >60)
GLUCOSE: 63 mg/dL — AB (ref 65–99)
Potassium: 3.4 mmol/L — ABNORMAL LOW (ref 3.5–5.1)
SODIUM: 138 mmol/L (ref 135–145)
Total Bilirubin: 2.8 mg/dL — ABNORMAL HIGH (ref 0.3–1.2)
Total Protein: 7 g/dL (ref 6.5–8.1)

## 2015-11-08 LAB — CBC WITH DIFFERENTIAL/PLATELET
Basophils Absolute: 0.1 10*3/uL (ref 0–0.1)
Basophils Relative: 1 %
EOS PCT: 2 %
Eosinophils Absolute: 0.1 10*3/uL (ref 0–0.7)
HCT: 33.2 % — ABNORMAL LOW (ref 35.0–47.0)
HEMOGLOBIN: 11.4 g/dL — AB (ref 12.0–16.0)
LYMPHS ABS: 1.2 10*3/uL (ref 1.0–3.6)
LYMPHS PCT: 15 %
MCH: 30.1 pg (ref 26.0–34.0)
MCHC: 34.3 g/dL (ref 32.0–36.0)
MCV: 87.8 fL (ref 80.0–100.0)
MONO ABS: 0.5 10*3/uL (ref 0.2–0.9)
MONOS PCT: 6 %
NEUTROS ABS: 6.1 10*3/uL (ref 1.4–6.5)
Neutrophils Relative %: 76 %
PLATELETS: 372 10*3/uL (ref 150–440)
RBC: 3.78 MIL/uL — AB (ref 3.80–5.20)
RDW: 15.9 % — ABNORMAL HIGH (ref 11.5–14.5)
WBC: 8 10*3/uL (ref 3.6–11.0)

## 2015-11-08 LAB — MAGNESIUM: Magnesium: 1.8 mg/dL (ref 1.7–2.4)

## 2015-11-08 MED ORDER — SODIUM CHLORIDE 0.9 % IV SOLN
225.0000 mg/m2/d | INTRAVENOUS | Status: AC
  Administered 2015-11-08: 2700 mg via INTRAVENOUS
  Filled 2015-11-08: qty 54

## 2015-11-08 MED ORDER — SODIUM CHLORIDE 0.9 % IV SOLN
INTRAVENOUS | Status: AC
  Administered 2015-11-08: 11:00:00 via INTRAVENOUS
  Filled 2015-11-08: qty 1000

## 2015-11-08 MED ORDER — SODIUM CHLORIDE 0.9% FLUSH
10.0000 mL | INTRAVENOUS | Status: DC | PRN
  Administered 2015-11-08: 10 mL
  Filled 2015-11-08: qty 10

## 2015-11-08 MED ORDER — SODIUM CHLORIDE 0.9% FLUSH
10.0000 mL | Freq: Once | INTRAVENOUS | Status: AC
  Administered 2015-11-08: 10 mL via INTRAVENOUS
  Filled 2015-11-08: qty 10

## 2015-11-08 MED ORDER — HEPARIN SOD (PORK) LOCK FLUSH 100 UNIT/ML IV SOLN: 500.0000 [IU] | Freq: Once | INTRAVENOUS | Status: DC | PRN

## 2015-11-08 MED ORDER — LIDOCAINE-PRILOCAINE 2.5-2.5 % EX CREA: 1.0000 "application " | TOPICAL_CREAM | CUTANEOUS | Status: DC | PRN

## 2015-11-08 NOTE — Progress Notes (Unsigned)
Reviewed patients labs with Dr. Oliva Bustard.  Elevated AST, ALT, Bili and Creat.  Proceed with treatment per Dr. Oliva Bustard

## 2015-11-08 NOTE — Progress Notes (Signed)
[place] @ [facility] [phone]) [phone]  Fax:(336) [place] OB: Mar 07, 1930  MR#: [id number]  [id number]  Patient Care Team: [name], MD as PCP - General (Physician Assistant) [name], RN as Registered Nurse  CHIEF COMPLAINT:  Chief Complaint  Patient presents with  . Pancreatic Cancer  1.  Carcinoma of head of pancreas 23 mm millimeter by 17 mm invading superior  MESENTERIC VEIN.  No arterial invasion was found. T2 N0 M0 tumor by EUS criteria.  Biopsy was positive for adenocarcinoma. (February, 2017). 2.  Carcinoma of breast diagnosis was in August 25 2011.  T1 cN0 M0 tumor ER/PR positive. 3.  Multiple comorbid condition with chronic renal failure, diabetes, high blood pressure, obesity bilateral knee replacement and poor performance status 4.We will start patient on 5-FU by continuous infusion and radiation therapy (November 08, 2015) and    No history exists.    Oncology Flowsheet 09/01/2015 09/02/2015 09/03/2015 09/04/2015 09/05/2015 09/06/2015  letrozole (FEMARA) PO 2.5 mg 2.5 mg 2.5 mg 2.5 mg 2.5 mg 2.5 mg  ondansetron (ZOFRAN) IJ - - - - - -  ondansetron (ZOFRAN) IV - - - - 4 mg -  ondansetron (ZOFRAN) PO - - - 4 mg - -    INTERVAL HISTORY: 82 year old lady who is in wheelchair.  Patient was admitted in hospital for abdominal pain.  Nausea vomiting diarrhea underwent extensive workup included a noncontrast CT scan which revealed pancreatic mass.  Patient underwent EUS which was positive for adenocarcinoma and was stage TII N0 M0 tumor patient is here for further follow-up and consideration of treatment.  No nausea no vomiting.  Diarrhea has improved.  Patient has multiple comorbid condition.  As mentioned above [place] bilateral knee replacement, moderate obesity, in wheelchair walking with the help of walker.  Has a chronic renal failure, high blood pressure, diabetes, Patient could not get MRI scan because of significant renal  insufficiency. Has been evaluated today by Dr. Baruch Gouty, our radiation oncologists and decided to pursue radiation and chemotherapy as patient is not a candidate for surgical intervention based on multiple comorbid condition and patient does not desire surgery. Patient   had a port placement here to initiate 5-FU by continuous infusion. Patient also had questionable or changing the stent.  According to patient's history patient had a temporary stent placed for biliary drainage    REVIEW OF SYSTEMS:   Gen. status: Patient is here for further evaluation regarding pancreatic adenocarcinoma.   Overall poor performance status because of multiple comorbid condition.   HEENT: Denies any headache no visual disturbances.   Lungs: Shortness of breath on exertion.   Cardiac: No chest pain.   GI: Patient had nausea vomiting epigastric discomfort diarrhea which is gradually improving.  No hematemesis.  No melena.   Lower extremities no swelling skin: No rash neurological system difficult to examine musculoskeletal system bilateral knee replacement  As per HPI. Otherwise, a complete review of systems is negatve.  PAST MEDICAL HISTORY: Past Medical History  Diagnosis Date  . Hypertension   . GERD (gastroesophageal reflux disease)   . Diabetes mellitus without complication ([place])   . Breast cancer ([place]) 2013    c Lumpectomy 2013  . Uterine cancer ([place]) 1960    PAST SURGICAL HISTORY: Past Surgical History  Procedure Laterality Date  . Abdominal hysterectomy    . Knee arthroscopy    . Ercp N/A 09/04/2015    Procedure: ENDOSCOPIC RETROGRADE CHOLANGIOPANCREATOGRAPHY (ERCP);  Surgeon: Eddie Dibbles  Johnell Comings, MD;  Location: [facility] ENDOSCOPY;  Service: Gastroenterology;  Laterality: N/A;  . Breast biopsy Right 2012    Positive  . Upper esophageal endoscopic ultrasound (eus) N/A 10/04/2015    Procedure: UPPER ESOPHAGEAL ENDOSCOPIC ULTRASOUND (EUS);  Surgeon: Holly Bodily, MD;  Location: [facility] ENDOSCOPY;  Service:  Gastroenterology;  Laterality: N/A;  . Peripheral vascular catheterization N/A 11/05/2015    Procedure: Porta Cath Insertion;  Surgeon: Algernon Huxley, MD;  Location: [facility];  Service: Cardiovascular;  Laterality: N/A;    FAMILY HISTORY Family History  Problem Relation Age of Onset  . Cancer Mother   . Heart disease Brother     ADVANCED DIRECTIVES:  No flowsheet data found.  HEALTH MAINTENANCE: Social History  Substance Use Topics  . Smoking status: Never Smoker   . Smokeless tobacco: None  . Alcohol Use: No      Allergies  Allergen Reactions  . Colchicine   . Ketorolac   . Lactase Diarrhea  . Lipitor [Atorvastatin]     Current Outpatient Prescriptions  Medication Sig Dispense Refill  . amLODipine (NORVASC) 10 MG tablet Take 10 mg by mouth daily.    [name] aspirin 81 MG tablet Take 81 mg by mouth daily.    . febuxostat (ULORIC) 40 MG tablet Take 80 mg by mouth daily.    . furosemide (LASIX) 20 MG tablet Take 20 mg by mouth daily.    [name] glipiZIDE (GLUCOTROL) 5 MG tablet Take 5 mg by mouth daily before breakfast.     . insulin glargine (LANTUS) 100 UNIT/ML injection Inject 25 Units into the skin 2 (two) times daily.     [name] letrozole (FEMARA) 2.5 MG tablet Take 2.5 mg by mouth daily.    [name] levothyroxine (SYNTHROID, LEVOTHROID) 75 MCG tablet Take 75 mcg by mouth daily before breakfast.    . lisinopril (PRINIVIL,ZESTRIL) 40 MG tablet Take 40 mg by mouth daily.    . metoprolol (LOPRESSOR) 50 MG tablet Take 50 mg by mouth daily.    [name] omeprazole (PRILOSEC) 20 MG capsule Take 20 mg by mouth daily.    . pravastatin (PRAVACHOL) 40 MG tablet Take 40 mg by mouth daily.    . promethazine (PHENERGAN) 25 MG tablet Take 1 tablet (25 mg total) by mouth every 6 (six) hours as needed for nausea or vomiting. 30 tablet 1  . loperamide (IMODIUM) 2 MG capsule TK 1 C PO Q 6 H PRN FOR DIARRHEA OR LOOSE STOOLS  0   No current facility-administered medications for this visit.     OBJECTIVE:  Filed Vitals:   11/08/15 0947  BP: 134/76  Pulse: 84  Temp: 97.6 F (36.4 C)  Resp: 18     Body mass index is 40.18 kg/(m^2).    ECOG FS:2 - Symptomatic, <50% confined to bed  PHYSICAL EXAM: Patient is alert oriented not any acute distress.  Moderate obesity.  Patient is confined to the wheelchair. Lymphatic system: Supraclavicular, cervical, axillary, inguinal lymph nodes are not palpable. Lungs: Diminished air entry on both sides. Cardiac: Soft systolic murmur. Abdomen: Difficult to examine protuberant abdomen no ascites tenderness in epigastric area skin: No rash Neurological system: Complete examination is difficult.  There is no localizing signs HEENT: No abnormality detected Patient's ambulation is limited. All other systems have been examined.   LAB RESULTS:  CBC Latest Ref Rng 11/08/2015 10/16/2015  WBC 3.6 - 11.0 K/uL 8.0 7.0  Hemoglobin 12.0 - 16.0 g/dL 11.4(L) 11.3(L)  Hematocrit 35.0 -  47.0 % 33.2(L) 34.6(L)  Platelets 150 - 440 K/uL 372 304    Infusion on 11/08/2015  Component Date Value Ref Range Status  . WBC 11/08/2015 8.0  3.6 - 11.0 K/uL Final  . RBC 11/08/2015 3.78* 3.80 - 5.20 MIL/uL Final  . Hemoglobin 11/08/2015 11.4* 12.0 - 16.0 g/dL Final  . HCT 11/08/2015 33.2* 35.0 - 47.0 % Final  . MCV 11/08/2015 87.8  80.0 - 100.0 fL Final  . MCH 11/08/2015 30.1  26.0 - 34.0 pg Final  . MCHC 11/08/2015 34.3  32.0 - 36.0 g/dL Final  . RDW 11/08/2015 15.9* 11.5 - 14.5 % Final  . Platelets 11/08/2015 372  150 - 440 K/uL Final  . Neutrophils Relative % 11/08/2015 76   Final  . Neutro Abs 11/08/2015 6.1  1.4 - 6.5 K/uL Final  . Lymphocytes Relative 11/08/2015 15   Final  . Lymphs Abs 11/08/2015 1.2  1.0 - 3.6 K/uL Final  . Monocytes Relative 11/08/2015 6   Final  . Monocytes Absolute 11/08/2015 0.5  0.2 - 0.9 K/uL Final  . Eosinophils Relative 11/08/2015 2   Final  . Eosinophils Absolute 11/08/2015 0.1  0 - 0.7 K/uL Final  . Basophils Relative  11/08/2015 1   Final  . Basophils Absolute 11/08/2015 0.1  0 - 0.1 K/uL Final       STUDIES: No results found.  ASSESSMENT: Carcinoma of head of pancreas positive for adenocarcinoma EUS staging is T2 N0 M0 Multiple comorbid condition Renal failure will preclude any other evaluation for involvement of SMA.  We will start patient on 5-FU by continuous infusion initially for 5 days In my absence patient would be evaluated by my associate.  If tolerated then it can be increased to 7 days of continuous infusion.  Patient was instructed to call us right away if DIARRHEA or soreness in the mouth.  The cause of patient's comorbid condition as well as elderly situation will have to carefully monitor 5-FU by continuous infusion along with radiation therapy and monitor his side effect.  Informed consent was obtained. Macrobid radiation.  2,.  Appointment has been made with Dr.OH    To  change stent  Patient expressed understanding and was in agreement with this plan. She also understands that She can call clinic at any time with any questions, concerns, or complaints.    No matching staging information was found for the patient.  Forest Gleason, MD   11/08/2015 10:06 AM

## 2015-11-09 DIAGNOSIS — C250 Malignant neoplasm of head of pancreas: Secondary | ICD-10-CM | POA: Diagnosis not present

## 2015-11-12 ENCOUNTER — Ambulatory Visit
Admission: RE | Admit: 2015-11-12 | Discharge: 2015-11-12 | Disposition: A | Discharge status: Home / Self Care | Payer: Medicare Other | Source: Ambulatory Visit | Attending: Radiation Oncology | Admitting: Radiation Oncology

## 2015-11-13 ENCOUNTER — Ambulatory Visit
Admission: RE | Admit: 2015-11-13 | Discharge: 2015-11-13 | Disposition: A | Discharge status: Home / Self Care | Payer: Medicare Other | Source: Ambulatory Visit | Attending: Radiation Oncology | Admitting: Radiation Oncology

## 2015-11-13 ENCOUNTER — Inpatient Hospital Stay: Payer: Medicare Other

## 2015-11-13 DIAGNOSIS — C250 Malignant neoplasm of head of pancreas: Secondary | ICD-10-CM | POA: Diagnosis not present

## 2015-11-14 ENCOUNTER — Inpatient Hospital Stay: Payer: Medicare Other

## 2015-11-14 ENCOUNTER — Ambulatory Visit
Admission: RE | Admit: 2015-11-14 | Discharge: 2015-11-14 | Disposition: A | Discharge status: Home / Self Care | Payer: Medicare Other | Source: Ambulatory Visit | Attending: Radiation Oncology | Admitting: Radiation Oncology

## 2015-11-14 DIAGNOSIS — C250 Malignant neoplasm of head of pancreas: Secondary | ICD-10-CM | POA: Diagnosis not present

## 2015-11-15 ENCOUNTER — Inpatient Hospital Stay: Payer: Medicare Other

## 2015-11-15 ENCOUNTER — Ambulatory Visit
Admission: RE | Admit: 2015-11-15 | Discharge: 2015-11-15 | Disposition: A | Discharge status: Home / Self Care | Payer: Medicare Other | Source: Ambulatory Visit | Attending: Radiation Oncology | Admitting: Radiation Oncology

## 2015-11-15 ENCOUNTER — Inpatient Hospital Stay (HOSPITAL_BASED_OUTPATIENT_CLINIC_OR_DEPARTMENT_OTHER): Payer: Medicare Other | Admitting: Internal Medicine

## 2015-11-15 ENCOUNTER — Inpatient Hospital Stay: Payer: Medicare Other | Admitting: Internal Medicine

## 2015-11-15 VITALS — BP 147/69 | HR 75 | Temp 97.4°F | Resp 18 | Wt 259.9 lb

## 2015-11-15 DIAGNOSIS — K219 Gastro-esophageal reflux disease without esophagitis: Secondary | ICD-10-CM

## 2015-11-15 DIAGNOSIS — E669 Obesity, unspecified: Secondary | ICD-10-CM

## 2015-11-15 DIAGNOSIS — Z79899 Other long term (current) drug therapy: Secondary | ICD-10-CM

## 2015-11-15 DIAGNOSIS — Z8542 Personal history of malignant neoplasm of other parts of uterus: Secondary | ICD-10-CM

## 2015-11-15 DIAGNOSIS — C250 Malignant neoplasm of head of pancreas: Secondary | ICD-10-CM | POA: Diagnosis not present

## 2015-11-15 DIAGNOSIS — Z7982 Long term (current) use of aspirin: Secondary | ICD-10-CM

## 2015-11-15 DIAGNOSIS — R630 Anorexia: Secondary | ICD-10-CM

## 2015-11-15 DIAGNOSIS — Z853 Personal history of malignant neoplasm of breast: Secondary | ICD-10-CM

## 2015-11-15 DIAGNOSIS — I129 Hypertensive chronic kidney disease with stage 1 through stage 4 chronic kidney disease, or unspecified chronic kidney disease: Secondary | ICD-10-CM

## 2015-11-15 DIAGNOSIS — R112 Nausea with vomiting, unspecified: Secondary | ICD-10-CM

## 2015-11-15 DIAGNOSIS — E119 Type 2 diabetes mellitus without complications: Secondary | ICD-10-CM

## 2015-11-15 DIAGNOSIS — R0602 Shortness of breath: Secondary | ICD-10-CM

## 2015-11-15 DIAGNOSIS — Z794 Long term (current) use of insulin: Secondary | ICD-10-CM | POA: Diagnosis not present

## 2015-11-15 DIAGNOSIS — R7989 Other specified abnormal findings of blood chemistry: Secondary | ICD-10-CM

## 2015-11-15 DIAGNOSIS — N189 Chronic kidney disease, unspecified: Secondary | ICD-10-CM

## 2015-11-15 DIAGNOSIS — E876 Hypokalemia: Secondary | ICD-10-CM

## 2015-11-15 LAB — CBC WITH DIFFERENTIAL/PLATELET
BASOS ABS: 0.1 10*3/uL (ref 0–0.1)
Basophils Relative: 1 %
EOS PCT: 2 %
Eosinophils Absolute: 0.1 10*3/uL (ref 0–0.7)
HCT: 32.6 % — ABNORMAL LOW (ref 35.0–47.0)
HEMOGLOBIN: 11.1 g/dL — AB (ref 12.0–16.0)
LYMPHS ABS: 1.5 10*3/uL (ref 1.0–3.6)
LYMPHS PCT: 21 %
MCH: 30 pg (ref 26.0–34.0)
MCHC: 33.9 g/dL (ref 32.0–36.0)
MCV: 88.3 fL (ref 80.0–100.0)
Monocytes Absolute: 0.6 10*3/uL (ref 0.2–0.9)
Monocytes Relative: 9 %
NEUTROS PCT: 67 %
Neutro Abs: 4.7 10*3/uL (ref 1.4–6.5)
PLATELETS: 333 10*3/uL (ref 150–440)
RBC: 3.69 MIL/uL — AB (ref 3.80–5.20)
RDW: 16.6 % — ABNORMAL HIGH (ref 11.5–14.5)
WBC: 7 10*3/uL (ref 3.6–11.0)

## 2015-11-15 LAB — MAGNESIUM: Magnesium: 1.7 mg/dL (ref 1.7–2.4)

## 2015-11-15 LAB — COMPREHENSIVE METABOLIC PANEL
ALK PHOS: 634 U/L — AB (ref 38–126)
ALT: 138 U/L — AB (ref 14–54)
AST: 139 U/L — AB (ref 15–41)
Albumin: 3 g/dL — ABNORMAL LOW (ref 3.5–5.0)
Anion gap: 8 (ref 5–15)
BUN: 23 mg/dL — AB (ref 6–20)
CALCIUM: 8.6 mg/dL — AB (ref 8.9–10.3)
CHLORIDE: 104 mmol/L (ref 101–111)
CO2: 25 mmol/L (ref 22–32)
CREATININE: 1.9 mg/dL — AB (ref 0.44–1.00)
GFR, EST AFRICAN AMERICAN: 27 mL/min — AB (ref >60)
GFR, EST NON AFRICAN AMERICAN: 23 mL/min — AB (ref >60)
Glucose, Bld: 88 mg/dL (ref 65–99)
Potassium: 3.2 mmol/L — ABNORMAL LOW (ref 3.5–5.1)
SODIUM: 137 mmol/L (ref 135–145)
Total Bilirubin: 1.6 mg/dL — ABNORMAL HIGH (ref 0.3–1.2)
Total Protein: 7.1 g/dL (ref 6.5–8.1)

## 2015-11-15 MED ORDER — SODIUM CHLORIDE 0.9 % IV SOLN
2700.0000 mg | INTRAVENOUS | Status: DC
  Administered 2015-11-15: 2700 mg via INTRAVENOUS
  Filled 2015-11-15: qty 54

## 2015-11-15 MED ORDER — SODIUM CHLORIDE 0.9% FLUSH
10.0000 mL | INTRAVENOUS | Status: DC | PRN
  Administered 2015-11-15: 10 mL
  Filled 2015-11-15: qty 10

## 2015-11-15 MED ORDER — POTASSIUM CHLORIDE ER 20 MEQ PO TBCR: 20.0000 meq | EXTENDED_RELEASE_TABLET | Freq: Two times a day (BID) | ORAL | Status: AC

## 2015-11-15 NOTE — Progress Notes (Signed)
All of today's lab work reviewed with MD, Dr. Rogue Bussing, via telephone. Per MD, Dr. Rogue Bussing, order: proceed with 5FU treatment today.

## 2015-11-15 NOTE — Progress Notes (Signed)
[facility] OFF[facility] PROGRESS NOTE  Patient Care Team: [name], MD as PCP - General (Physician Assistant) [name], RN as Registered Nurse   SUMMARY OF ONCOLOGIC HISTORY: # FEB 2017- Carcinoma of head of pancreas; STAGE IB; T2N0 [EUS]- 23 mm millimeter by 17 mm invading superior MESENTERIC VEIN. No arterial invasion was found.  November 08, 2015- 5-FU by continuous infusion and radiation therapy [6 weeks]  # Carcinoma of breast diagnosis was in August 25 2011. T1 cN0 M0 tumor ER/PR positive.  # CKD/ diabetes,/ obesity bilateral knee replacement and poor performance status   INTERVAL HISTORY:  This is my first interaction with the patient since Dr.Choksi's absence. I reviewed the patient's prior charts/pertinent labs/imaging in detail; findings are summarized above.   A very pleasant 82 year old female patient with above history of stage IB pancreatic cancer currently on concurrent chemoradiation is here for follow-up.  Patient had one cycle of chemotherapy so for. Denies any nausea vomiting. Denies any diarrhea. Denies any sores in the mouth. No rash on Palms and soles.  She is a poor appetite. No swelling in the legs or arms.  REVIEW OF SYSTEMS:  A complete 10 point review of system is done which is negative except mentioned above/history of present illness.   PAST MEDICAL HISTORY :  Past Medical History  Diagnosis Date  . Hypertension   . GERD (gastroesophageal reflux disease)   . Diabetes mellitus without complication ([place])   . Breast cancer ([place]) 2013    c Lumpectomy 2013  . Uterine cancer ([place]) 1960    PAST SURGICAL HISTORY :   Past Surgical History  Procedure Laterality Date  . Abdominal hysterectomy    . Knee arthroscopy    . Ercp N/A 09/04/2015    Procedure: ENDOSCOPIC RETROGRADE CHOLANGIOPANCREATOGRAPHY (ERCP);  Surgeon: Hulen Luster, MD;  Location: [facility] ENDOSCOPY;  Service: Gastroenterology;  Laterality: N/A;  . Breast biopsy Right  2012    Positive  . Upper esophageal endoscopic ultrasound (eus) N/A 10/04/2015    Procedure: UPPER ESOPHAGEAL ENDOSCOPIC ULTRASOUND (EUS);  Surgeon: Holly Bodily, MD;  Location: [facility] ENDOSCOPY;  Service: Gastroenterology;  Laterality: N/A;  . Peripheral vascular catheterization N/A 11/05/2015    Procedure: Porta Cath Insertion;  Surgeon: Algernon Huxley, MD;  Location: [place] [facility];  Service: Cardiovascular;  Laterality: N/A;    FAMILY HISTORY :   Family History  Problem Relation Age of Onset  . Cancer Mother   . Heart disease Brother     SOCIAL HISTORY:   Social History  Substance Use Topics  . Smoking status: Never Smoker   . Smokeless tobacco: Not on file  . Alcohol Use: No    ALLERGIES:  is allergic to colchicine; ketorolac; lactase; and lipitor.  MEDICATIONS:  Current Outpatient Prescriptions  Medication Sig Dispense Refill  . amLODipine (NORVASC) 10 MG tablet Take 10 mg by mouth daily.    [name] aspirin 81 MG tablet Take 81 mg by mouth daily.    . febuxostat (ULORIC) 40 MG tablet Take 80 mg by mouth daily.    . furosemide (LASIX) 20 MG tablet Take 20 mg by mouth daily.    [name] glipiZIDE (GLUCOTROL) 5 MG tablet Take 5 mg by mouth daily before breakfast.     . insulin glargine (LANTUS) 100 UNIT/ML injection Inject 25 Units into the skin 2 (two) times daily.     [name] letrozole (FEMARA) 2.5 MG tablet Take 2.5 mg by mouth daily.    [name]  levothyroxine (SYNTHROID, LEVOTHROID) 75 MCG tablet Take 75 mcg by mouth daily before breakfast.    . lidocaine-prilocaine (EMLA) cream Apply 1 application topically as needed. 30 g 3  . lisinopril (PRINIVIL,ZESTRIL) 40 MG tablet Take 40 mg by mouth daily.    [name] loperamide (IMODIUM) 2 MG capsule TK 1 C PO Q 6 H PRN FOR DIARRHEA OR LOOSE STOOLS  0  . metoprolol (LOPRESSOR) 50 MG tablet Take 50 mg by mouth daily.    [name] omeprazole (PRILOSEC) 20 MG capsule Take 20 mg by mouth daily.    . pravastatin (PRAVACHOL) 40 MG tablet Take 40 mg by mouth daily.     . promethazine (PHENERGAN) 25 MG tablet Take 1 tablet (25 mg total) by mouth every 6 (six) hours as needed for nausea or vomiting. 30 tablet 1   No current facility-administered medications for this visit.   Facility-Administered Medications Ordered in Other Visits  Medication Dose Route Frequency Provider Last Rate Last Dose  . 0.9 %  sodium chloride infusion   Intravenous Continuous Forest Gleason, MD   Stopped at 11/08/15 1144  . heparin lock flush 100 unit/mL  500 Units Intracatheter Once PRN Forest Gleason, MD      . sodium chloride flush (NS) 0.9 % injection 10 mL  10 mL Intracatheter PRN Forest Gleason, MD   10 mL at 11/08/15 1052    PHYSICAL EXAMINATION: ECOG PERFORMANCE STATUS: 3 - Symptomatic, >50% confined to bed  There were no vitals taken for this visit.  There were no vitals filed for this visit.  GENERAL: Well-nourished well-developed; Obese Alert, no distress and comfortable.   With her sister; in a wheel chair.  EYES: no pallor or icterus OROPHARYNX: no thrush or ulceration; good dentition  NECK: supple, no masses felt LYMPH:  no palpable lymphadenopathy in the cervical, axillary or inguinal regions LUNGS: clear to auscultation and  No wheeze or crackles HEART/CVS: regular rate & rhythm and no murmurs; positive for bil Lower extremity edema ABDOMEN:abdomen soft, non-tender and normal bowel sounds Musculoskeletal:no cyanosis of digits and no clubbing  PSYCH: alert & oriented x 3 with fluent speech NEURO: no focal motor/sensory deficits SKIN:  no rashes or significant lesions  LABORATORY DATA:  I have reviewed the data as listed    Component Value Date/Time   NA 137 11/15/2015 1158   NA 138 11/19/2013 0822   K 3.2* 11/15/2015 1158   K 3.8 11/19/2013 0822   CL 104 11/15/2015 1158   CL 103 11/19/2013 0822   CO2 25 11/15/2015 1158   CO2 30 11/19/2013 0822   GLUCOSE 88 11/15/2015 1158   GLUCOSE 126* 11/19/2013 0822   BUN 23* 11/15/2015 1158   BUN 18 11/19/2013  0822   CREATININE 1.90* 11/15/2015 1158   CREATININE 2.31* 11/19/2013 0822   CALCIUM 8.6* 11/15/2015 1158   CALCIUM 8.7 11/19/2013 0822   PROT 7.1 11/15/2015 1158   PROT 7.4 11/19/2013 0822   ALBUMIN 3.0* 11/15/2015 1158   ALBUMIN 3.3* 11/19/2013 0822   AST 139* 11/15/2015 1158   AST 16 11/19/2013 0822   ALT 138* 11/15/2015 1158   ALT 13 11/19/2013 0822   ALKPHOS 634* 11/15/2015 1158   ALKPHOS 94 11/19/2013 0822   BILITOT 1.6* 11/15/2015 1158   BILITOT 0.4 11/19/2013 0822   GFRNONAA 23* 11/15/2015 1158   GFRNONAA 19* 11/19/2013 0822   GFRAA 27* 11/15/2015 1158   GFRAA 22* 11/19/2013 0822    No results found for: SPEP, UPEP  Lab Results  Component Value Date   WBC 7.0 11/15/2015   NEUTROABS 4.7 11/15/2015   HGB 11.1* 11/15/2015   HCT 32.6* 11/15/2015   MCV 88.3 11/15/2015   PLT 333 11/15/2015      Chemistry      Component Value Date/Time   NA 137 11/15/2015 1158   NA 138 11/19/2013 0822   K 3.2* 11/15/2015 1158   K 3.8 11/19/2013 0822   CL 104 11/15/2015 1158   CL 103 11/19/2013 0822   CO2 25 11/15/2015 1158   CO2 30 11/19/2013 0822   BUN 23* 11/15/2015 1158   BUN 18 11/19/2013 0822   CREATININE 1.90* 11/15/2015 1158   CREATININE 2.31* 11/19/2013 0822      Component Value Date/Time   CALCIUM 8.6* 11/15/2015 1158   CALCIUM 8.7 11/19/2013 0822   ALKPHOS 634* 11/15/2015 1158   ALKPHOS 94 11/19/2013 0822   AST 139* 11/15/2015 1158   AST 16 11/19/2013 0822   ALT 138* 11/15/2015 1158   ALT 13 11/19/2013 0822   BILITOT 1.6* 11/15/2015 1158   BILITOT 0.4 11/19/2013 0822        ASSESSMENT & PLAN:   # PANCREATIC Adenocarcinoma- T2N0-STAGE IB currently on concurrent chemoradiation with 5-FU. Patient not a candidate for surgery/or aggressive treatments.   # White count 7.0 hemoglobin 11 platelets 333. Proceed with 5-FU cycle #2 today. Chronic kidney disease stable creatinine of 1.9  # Elevated LFTs- ? Etiology- improving AST today 139 ALT 138 total  bilirubin 1.6/alkaline phosphatase 634. Patient is awaiting stent removal/metal stent placement for next week. Discuss with Dr. Candace Cruise- regarding the concerns for elevated LFTs.   # Patient follow-up with me in 1 week/5-FU infusion. We'll go up on the infusion to 225 mg/m/per day 7 days if patient tolerating treatment well.  # 25 minutes face-to-face with the patient discussing the above plan of care; more than 50% of time spent on prognosis/ natural history; counseling and coordination.     Cammie Sickle, MD 11/15/2015 1:21 PM

## 2015-11-15 NOTE — Progress Notes (Signed)
Patient was started on 5FU infusion on 3/23. Order was entered for 7 day infusion, however dose entered was 225mg /m2/day over 5 days for total dose of 2700mg , but infused over 7 days. Called and spoke with Dr Rogue Bussing about dose and infusion time. MD stated to continue current dose at this time which will be 225mg /m2/day x5 days for total of 2700mg , however infuse this dose over 7 day period (168hrs).

## 2015-11-16 ENCOUNTER — Ambulatory Visit
Admission: RE | Admit: 2015-11-16 | Discharge: 2015-11-16 | Disposition: A | Discharge status: Home / Self Care | Payer: Medicare Other | Source: Ambulatory Visit | Attending: Radiation Oncology | Admitting: Radiation Oncology

## 2015-11-16 DIAGNOSIS — C250 Malignant neoplasm of head of pancreas: Secondary | ICD-10-CM | POA: Diagnosis not present

## 2015-11-19 ENCOUNTER — Inpatient Hospital Stay
Admission: EM | Admit: 2015-11-19 | Discharge: 2015-11-25 | DRG: 871 | Disposition: A | Discharge status: Skilled Nursing Facility | Payer: Medicare Other | Attending: Internal Medicine | Admitting: Internal Medicine

## 2015-11-19 ENCOUNTER — Emergency Department: Payer: Medicare Other

## 2015-11-19 ENCOUNTER — Encounter: Payer: Self-pay | Admitting: Emergency Medicine

## 2015-11-19 ENCOUNTER — Ambulatory Visit
Admission: RE | Admit: 2015-11-19 | Discharge: 2015-11-19 | Disposition: A | Discharge status: Home / Self Care | Payer: Medicare Other | Source: Ambulatory Visit | Attending: Radiation Oncology | Admitting: Radiation Oncology

## 2015-11-19 DIAGNOSIS — N170 Acute kidney failure with tubular necrosis: Secondary | ICD-10-CM | POA: Diagnosis present

## 2015-11-19 DIAGNOSIS — E876 Hypokalemia: Secondary | ICD-10-CM | POA: Diagnosis present

## 2015-11-19 DIAGNOSIS — C250 Malignant neoplasm of head of pancreas: Secondary | ICD-10-CM | POA: Diagnosis present

## 2015-11-19 DIAGNOSIS — Z923 Personal history of irradiation: Secondary | ICD-10-CM

## 2015-11-19 DIAGNOSIS — Z888 Allergy status to other drugs, medicaments and biological substances status: Secondary | ICD-10-CM

## 2015-11-19 DIAGNOSIS — Z794 Long term (current) use of insulin: Secondary | ICD-10-CM

## 2015-11-19 DIAGNOSIS — A4151 Sepsis due to Escherichia coli [E. coli]: Principal | ICD-10-CM | POA: Diagnosis present

## 2015-11-19 DIAGNOSIS — Z8541 Personal history of malignant neoplasm of cervix uteri: Secondary | ICD-10-CM

## 2015-11-19 DIAGNOSIS — D638 Anemia in other chronic diseases classified elsewhere: Secondary | ICD-10-CM | POA: Diagnosis present

## 2015-11-19 DIAGNOSIS — Z6839 Body mass index (BMI) 39.0-39.9, adult: Secondary | ICD-10-CM

## 2015-11-19 DIAGNOSIS — Z809 Family history of malignant neoplasm, unspecified: Secondary | ICD-10-CM

## 2015-11-19 DIAGNOSIS — Z853 Personal history of malignant neoplasm of breast: Secondary | ICD-10-CM

## 2015-11-19 DIAGNOSIS — A419 Sepsis, unspecified organism: Secondary | ICD-10-CM | POA: Diagnosis present

## 2015-11-19 DIAGNOSIS — Z8249 Family history of ischemic heart disease and other diseases of the circulatory system: Secondary | ICD-10-CM

## 2015-11-19 DIAGNOSIS — R079 Chest pain, unspecified: Secondary | ICD-10-CM | POA: Diagnosis present

## 2015-11-19 DIAGNOSIS — Z79899 Other long term (current) drug therapy: Secondary | ICD-10-CM

## 2015-11-19 DIAGNOSIS — K830 Cholangitis: Secondary | ICD-10-CM | POA: Diagnosis present

## 2015-11-19 DIAGNOSIS — E1122 Type 2 diabetes mellitus with diabetic chronic kidney disease: Secondary | ICD-10-CM | POA: Diagnosis present

## 2015-11-19 DIAGNOSIS — L8990 Pressure ulcer of unspecified site, unspecified stage: Secondary | ICD-10-CM | POA: Diagnosis not present

## 2015-11-19 DIAGNOSIS — R17 Unspecified jaundice: Secondary | ICD-10-CM

## 2015-11-19 DIAGNOSIS — I129 Hypertensive chronic kidney disease with stage 1 through stage 4 chronic kidney disease, or unspecified chronic kidney disease: Secondary | ICD-10-CM | POA: Diagnosis present

## 2015-11-19 DIAGNOSIS — K831 Obstruction of bile duct: Secondary | ICD-10-CM | POA: Diagnosis present

## 2015-11-19 DIAGNOSIS — K219 Gastro-esophageal reflux disease without esophagitis: Secondary | ICD-10-CM | POA: Diagnosis present

## 2015-11-19 DIAGNOSIS — N184 Chronic kidney disease, stage 4 (severe): Secondary | ICD-10-CM | POA: Diagnosis present

## 2015-11-19 DIAGNOSIS — E872 Acidosis: Secondary | ICD-10-CM | POA: Diagnosis present

## 2015-11-19 DIAGNOSIS — R6520 Severe sepsis without septic shock: Secondary | ICD-10-CM | POA: Diagnosis present

## 2015-11-19 DIAGNOSIS — I248 Other forms of acute ischemic heart disease: Secondary | ICD-10-CM | POA: Diagnosis present

## 2015-11-19 HISTORY — DX: Malignant neoplasm of pancreas, unspecified: C25.9

## 2015-11-19 HISTORY — DX: Abnormal results of liver function studies: R94.5

## 2015-11-19 HISTORY — DX: Other specified abnormal findings of blood chemistry: R79.89

## 2015-11-19 LAB — CBC WITH DIFFERENTIAL/PLATELET
BASOS ABS: 0 10*3/uL (ref 0–0.1)
Basophils Relative: 0 %
EOS ABS: 0 10*3/uL (ref 0–0.7)
EOS PCT: 0 %
HCT: 33 % — ABNORMAL LOW (ref 35.0–47.0)
Hemoglobin: 11 g/dL — ABNORMAL LOW (ref 12.0–16.0)
Lymphocytes Relative: 1 %
Lymphs Abs: 0.2 10*3/uL — ABNORMAL LOW (ref 1.0–3.6)
MCH: 30.4 pg (ref 26.0–34.0)
MCHC: 33.5 g/dL (ref 32.0–36.0)
MCV: 90.9 fL (ref 80.0–100.0)
Monocytes Absolute: 0.2 10*3/uL (ref 0.2–0.9)
Monocytes Relative: 1 %
NEUTROS PCT: 98 %
Neutro Abs: 19.2 10*3/uL — ABNORMAL HIGH (ref 1.4–6.5)
PLATELETS: 226 10*3/uL (ref 150–440)
RBC: 3.63 MIL/uL — AB (ref 3.80–5.20)
RDW: 17.4 % — AB (ref 11.5–14.5)
WBC: 19.6 10*3/uL — AB (ref 3.6–11.0)

## 2015-11-19 LAB — COMPREHENSIVE METABOLIC PANEL
ALK PHOS: 798 U/L — AB (ref 38–126)
ALT: 210 U/L — ABNORMAL HIGH (ref 14–54)
ANION GAP: 18 — AB (ref 5–15)
AST: 311 U/L — ABNORMAL HIGH (ref 15–41)
Albumin: 2.6 g/dL — ABNORMAL LOW (ref 3.5–5.0)
BUN: 41 mg/dL — ABNORMAL HIGH (ref 6–20)
CALCIUM: 8.6 mg/dL — AB (ref 8.9–10.3)
CO2: 14 mmol/L — AB (ref 22–32)
Chloride: 105 mmol/L (ref 101–111)
Creatinine, Ser: 3.51 mg/dL — ABNORMAL HIGH (ref 0.44–1.00)
GFR calc non Af Amer: 11 mL/min — ABNORMAL LOW (ref >60)
GFR, EST AFRICAN AMERICAN: 13 mL/min — AB (ref >60)
Glucose, Bld: 166 mg/dL — ABNORMAL HIGH (ref 65–99)
POTASSIUM: 3.5 mmol/L (ref 3.5–5.1)
SODIUM: 137 mmol/L (ref 135–145)
TOTAL PROTEIN: 6.7 g/dL (ref 6.5–8.1)
Total Bilirubin: 6.3 mg/dL — ABNORMAL HIGH (ref 0.3–1.2)

## 2015-11-19 LAB — LACTIC ACID, PLASMA
Lactic Acid, Venous: 10.5 mmol/L (ref 0.5–2.0)
Lactic Acid, Venous: 8.7 mmol/L (ref 0.5–2.0)

## 2015-11-19 LAB — TROPONIN I: TROPONIN I: 0.12 ng/mL — AB (ref <0.031)

## 2015-11-19 LAB — LIPASE, BLOOD: Lipase: <10 U/L — ABNORMAL LOW (ref 11–51)

## 2015-11-19 MED ORDER — SODIUM CHLORIDE 0.9 % IV BOLUS (SEPSIS)
3000.0000 mL | Freq: Once | INTRAVENOUS | Status: AC
  Administered 2015-11-19: 3000 mL via INTRAVENOUS

## 2015-11-19 MED ORDER — VANCOMYCIN HCL IN DEXTROSE 1-5 GM/200ML-% IV SOLN
1000.0000 mg | Freq: Once | INTRAVENOUS | Status: AC
  Administered 2015-11-20: 1000 mg via INTRAVENOUS
  Filled 2015-11-19: qty 200

## 2015-11-19 MED ORDER — PIPERACILLIN-TAZOBACTAM 3.375 G IVPB 30 MIN
3.3750 g | Freq: Once | INTRAVENOUS | Status: AC
  Administered 2015-11-19: 3.375 g via INTRAVENOUS
  Filled 2015-11-19: qty 50

## 2015-11-19 NOTE — ED Notes (Signed)
Pt with new pancreatic cancer dx; currently receiving radiation and chemo x approximately 2 weeks.  Pt with worsening weakness today; pt unable to get back into bed from bathroom, fell to floor. Pt denies LOC, denies hitting head.  Pt hypotensive upon arrival, tachypneic, other vitas WDL.  Pt currently on "slow drip" continuous chemo vita port in right chest.  Pt family reports nausea, vomitting, chills, pt family denies any fever, cough. MD at bedside.

## 2015-11-19 NOTE — ED Notes (Signed)
Chemotherapy pump removed by 1C RN Silva Bandy. Pump given back to family, pump belongs to cancer center. 57.25ml/150ml left in chemotherapy bag.

## 2015-11-19 NOTE — ED Notes (Signed)
Resumed care from [facility].  Pt alert.  Iv fluids infusing thru porta cath.  Family with pt

## 2015-11-19 NOTE — ED Notes (Signed)
Went in to start peripheral IV on pt and draw labs. Family in with pt did not want IV started on pt and wanted port to be used instead. Chemo currently running through port. Explained to family that because chemo is running through it it cabnnot be used in the ER because as an ER nurse I am not certified to stop chemo that is currently running. Family asked that we instead straight stick pt and then start peripheral IV later if needed. Spoke with provider who said that was fine.

## 2015-11-19 NOTE — ED Provider Notes (Signed)
[facility] Emergency Department Provider Note    ____________________________________________  Time seen: ~1730  I have reviewed the triage vital signs and the nursing notes.   HISTORY  Chief Complaint Weakness   History limited by: Not limited, some history obtained from family   HPI Pamela [facility] is a 82 y.o. female with history of recently diagnosed pancreatic cancer he started chemotherapy 1-1/2 weeks ago who presents to the emergency department today because of the lethargy. The patient felt generally tired. She was unable to get out of bed. In addition family has noted that they have seen some yellowing of the eyes. No fevers although the patient does state she feels like she has had chills. She is getting radiation daily and did undergo radiation earlier today. They have not noticed any cough or difficulty breathing.     Past Medical History  Diagnosis Date  . Hypertension   . GERD (gastroesophageal reflux disease)   . Diabetes mellitus without complication ([facility])   . Breast cancer ([facility]) 2013    c Lumpectomy 2013  . Uterine cancer [facility]) 1960    Patient Active Problem List   Diagnosis Date Noted  . Allergic rhinitis 10/16/2015  . Controlled type 2 diabetes mellitus without complication ([place]) 33/00/7622  . Acid reflux 10/16/2015  . Gout 10/16/2015  . H/O malignant neoplasm of breast 10/16/2015  . HLD (hyperlipidemia) 10/16/2015  . BP (high blood pressure) 10/16/2015  . Adult hypothyroidism 10/16/2015  . Anemia, iron deficiency 10/16/2015  . Morbid obesity ([facility]) 10/16/2015  . Adiposity 10/16/2015  . Arthritis, degenerative 10/16/2015  . Primary malignant neoplasm of head of pancreas ([facility]) 10/11/2015  . Protein-calorie malnutrition, severe 09/02/2015  . Severe protein-calorie malnutrition Altamease Oiler: less than 60% of standard weight) ([facility]) 09/02/2015  . Obstructive jaundice 08/31/2015  . Biliary and gallbladder disorder 08/31/2015  .  Abnormal loss of weight 07/26/2015  . D (diarrhea) 07/26/2015  . Abnormal weight loss 07/26/2015  . Type 2 diabetes mellitus ([facility]) 03/11/2015  . Right hip pain 01/24/2013  . HTN (hypertension) 01/24/2013  . CKD (chronic kidney disease) stage 5, GFR less than 15 ml/min ([facility]) 01/24/2013  . Diabetes mellitus type 2 in obese ([facility]) 01/24/2013  . Chronic kidney disease, stage IV (severe) ([facility]) 01/24/2013  . Essential (primary) hypertension 01/24/2013  . Arthralgia of hip or thigh 01/24/2013    Past Surgical History  Procedure Laterality Date  . Abdominal hysterectomy    . Knee arthroscopy    . Ercp N/A 09/04/2015    Procedure: ENDOSCOPIC RETROGRADE CHOLANGIOPANCREATOGRAPHY (ERCP);  Surgeon: Hulen Luster, MD;  Location: [facility] ENDOSCOPY;  Service: Gastroenterology;  Laterality: N/A;  . Breast biopsy Right 2012    Positive  . Upper esophageal endoscopic ultrasound (eus) N/A 10/04/2015    Procedure: UPPER ESOPHAGEAL ENDOSCOPIC ULTRASOUND (EUS);  Surgeon: Pamela Bodily, MD;  Location: [facility];  Service: Gastroenterology;  Laterality: N/A;  . Peripheral vascular catheterization N/A 11/05/2015    Procedure: Porta Cath Insertion;  Surgeon: Pamela Huxley, MD;  Location: [facility];  Service: Cardiovascular;  Laterality: N/A;    Current Outpatient Rx  Name  Route  Sig  Dispense  Refill  . amLODipine (NORVASC) 10 MG tablet   Oral   Take 10 mg by mouth daily.         [facility] aspirin 81 MG tablet   Oral   Take 81 mg by mouth daily.         . febuxostat (ULORIC) 40  MG tablet   Oral   Take 80 mg by mouth daily.         . furosemide (LASIX) 20 MG tablet   Oral   Take 20 mg by mouth daily.         Pamela [facility] glipiZIDE (GLUCOTROL) 5 MG tablet   Oral   Take 5 mg by mouth daily before breakfast.          . insulin glargine (LANTUS) 100 UNIT/ML injection   Subcutaneous   Inject 25 Units into the skin 2 (two) times daily.          P[facility]ela [facility] letrozole (FEMARA) 2.5 MG tablet   Oral   Take  2.5 mg by mouth daily.         P[facility] levothyroxine (SYNTHROID, LEVOTHROID) 75 MCG tablet   Oral   Take 75 mcg by mouth daily before breakfast.         . lidocaine-prilocaine (EMLA) cream   Topical   Apply 1 application topically as needed.   30 g   3   . lisinopril (PRINIVIL,ZESTRIL) 40 MG tablet   Oral   Take 40 mg by mouth daily.         [facility] loperamide (IMODIUM) 2 MG capsule      TK 1 C PO Q 6 H PRN FOR DIARRHEA OR LOOSE STOOLS      0   . metoprolol (LOPRESSOR) 50 MG tablet   Oral   Take 50 mg by mouth daily.         [facility] omeprazole (PRILOSEC) 20 MG capsule   Oral   Take 20 mg by mouth daily.         . potassium chloride 20 MEQ TBCR   Oral   Take 20 mEq by mouth 2 (two) times daily.   30 tablet   0   . pravastatin (PRAVACHOL) 40 MG tablet   Oral   Take 40 mg by mouth daily.         . promethazine (PHENERGAN) 25 MG tablet   Oral   Take 1 tablet (25 mg total) by mouth every 6 (six) hours as needed for nausea or vomiting.   30 tablet   1     Allergies Colchicine; Ketorolac; Lactase; and Lipitor  Family History  Problem Relation Age of Onset  . Cancer Mother   . Heart disease Brother     Social History Social History  Substance Use Topics  . Smoking status: Never Smoker   . Smokeless tobacco: Not on file  . Alcohol Use: No    Review of Systems  Constitutional: Negative for fever. Cardiovascular: Negative for chest pain. Respiratory: Negative for shortness of breath. Gastrointestinal: Negative for abdominal pain, vomiting and diarrhea. Neurological: Negative for headaches, focal weakness or numbness. 10-point ROS otherwise negative.  ____________________________________________   PHYSICAL EXAM:  VITAL SIGNS:    97.7 F (36.5 C)  92   32   112/58 mmHg  99 %     Constitutional: Alert and oriented. In no acute distress.  Eyes: Conjunctivae are normal. PERRL. Normal extraocular movements. ENT   Head: Normocephalic and  atraumatic.   Nose: No congestion/rhinnorhea.   Mouth/Throat: Mucous membranes are moist.   Neck: No stridor. Hematological/Lymphatic/Immunilogical: No cervical lymphadenopathy. Cardiovascular: Normal rate, regular rhythm.  No murmurs, rubs, or gallops. Port in place over right chest, no surrounding erythema noted.  Respiratory: Normal respiratory effort without tachypnea nor retractions. Breath sounds are clear and equal bilaterally. No wheezes/rales/rhonchi. Gastrointestinal:  Soft and nontender. No distention.  Genitourinary: Deferred Musculoskeletal: Normal range of motion in all extremities. No joint effusions.  No lower extremity tenderness nor edema. Neurologic:  Normal speech and language. No gross focal neurologic deficits are appreciated.  Skin:  Skin is warm, dry and intact. No rash noted. Psychiatric: Mood and affect are normal. Speech and behavior are normal. Patient exhibits appropriate insight and judgment.  ____________________________________________    LABS (pertinent positives/negatives)  Lactic acid: 10.5 WBC 19.6 Trop 0.12 Lipase <10 AST 311 ALT 210 Alk phos 798 Bili 6.3   ____________________________________________   EKG  None  ____________________________________________    RADIOLOGY  CXR IMPRESSION: No active disease.   CT abd/pel pending at time of admission  ____________________________________________   PROCEDURES  Procedure(s) performed: None  Critical Care performed: Yes, see critical care note(s)  CRITICAL CARE Performed by: Nance Pear   Total critical care time: 45 minutes  Critical care time was exclusive of separately billable procedures and treating other patients.  Critical care was necessary to treat or prevent imminent or life-threatening deterioration.  Critical care was time spent personally by me on the following activities: development of treatment plan with patient and/or surrogate as well as  nursing, discussions with consultants, evaluation of patient's response to treatment, examination of patient, obtaining history from patient or surrogate, ordering and performing treatments and interventions, ordering and review of laboratory studies, ordering and review of radiographic studies, pulse oximetry and re-evaluation of patient's condition.  ____________________________________________   INITIAL IMPRESSION / ASSESSMENT AND PLAN / ED COURSE  Pertinent labs & imaging results that were available during my care of the patient were reviewed by me and considered in my medical decision making (see chart for details).  Patient presented to the emergency department today because of concerns for lethargy. Patient is currently getting an infusion of chemotherapy for pancreatic cancer. Patient denied any fevers. Patient's blood work however concerning for sepsis given elevated lactic acid and leukocytosis. She initially troponin was elevated which I think likely secondary to sepsis. Furthermore creatinine and liver function tests elevated. Patient does currently have a biliary stent. We will obtain a CT scan to evaluate biliary tree. Patient was put on broad-spectrum antibiotics and IV fluid bolus. Will plan on admission to the [facility] service.  ____________________________________________   FINAL CLINICAL IMPRESSION(S) / ED DIAGNOSES  Icterus Sepsis Elevated troponin Lactic acidosis Transaminitis  Nance Pear, MD 11/21/15 [phone]

## 2015-11-20 ENCOUNTER — Ambulatory Visit: Admission: RE | Admit: 2015-11-20 | Payer: Medicare Other | Source: Ambulatory Visit

## 2015-11-20 ENCOUNTER — Encounter: Payer: Self-pay | Admitting: Internal Medicine

## 2015-11-20 ENCOUNTER — Ambulatory Visit
Admit: 2015-11-20 | Discharge: 2015-11-20 | Disposition: A | Discharge status: Home / Self Care | Payer: Medicare Other | Attending: Radiation Oncology | Admitting: Radiation Oncology

## 2015-11-20 ENCOUNTER — Other Ambulatory Visit: Payer: Self-pay | Admitting: Internal Medicine

## 2015-11-20 DIAGNOSIS — R6520 Severe sepsis without septic shock: Secondary | ICD-10-CM | POA: Diagnosis present

## 2015-11-20 DIAGNOSIS — Z8541 Personal history of malignant neoplasm of cervix uteri: Secondary | ICD-10-CM | POA: Diagnosis not present

## 2015-11-20 DIAGNOSIS — A4151 Sepsis due to Escherichia coli [E. coli]: Secondary | ICD-10-CM | POA: Diagnosis present

## 2015-11-20 DIAGNOSIS — R079 Chest pain, unspecified: Secondary | ICD-10-CM | POA: Diagnosis present

## 2015-11-20 DIAGNOSIS — K831 Obstruction of bile duct: Secondary | ICD-10-CM | POA: Diagnosis present

## 2015-11-20 DIAGNOSIS — Z79899 Other long term (current) drug therapy: Secondary | ICD-10-CM | POA: Diagnosis not present

## 2015-11-20 DIAGNOSIS — C250 Malignant neoplasm of head of pancreas: Secondary | ICD-10-CM | POA: Diagnosis present

## 2015-11-20 DIAGNOSIS — R17 Unspecified jaundice: Secondary | ICD-10-CM | POA: Diagnosis present

## 2015-11-20 DIAGNOSIS — N170 Acute kidney failure with tubular necrosis: Secondary | ICD-10-CM | POA: Diagnosis present

## 2015-11-20 DIAGNOSIS — A419 Sepsis, unspecified organism: Secondary | ICD-10-CM | POA: Diagnosis present

## 2015-11-20 DIAGNOSIS — Z794 Long term (current) use of insulin: Secondary | ICD-10-CM | POA: Diagnosis not present

## 2015-11-20 DIAGNOSIS — K219 Gastro-esophageal reflux disease without esophagitis: Secondary | ICD-10-CM | POA: Diagnosis present

## 2015-11-20 DIAGNOSIS — Z888 Allergy status to other drugs, medicaments and biological substances status: Secondary | ICD-10-CM | POA: Diagnosis not present

## 2015-11-20 DIAGNOSIS — N184 Chronic kidney disease, stage 4 (severe): Secondary | ICD-10-CM | POA: Diagnosis present

## 2015-11-20 DIAGNOSIS — E1122 Type 2 diabetes mellitus with diabetic chronic kidney disease: Secondary | ICD-10-CM | POA: Diagnosis present

## 2015-11-20 DIAGNOSIS — Z853 Personal history of malignant neoplasm of breast: Secondary | ICD-10-CM | POA: Diagnosis not present

## 2015-11-20 DIAGNOSIS — Z6839 Body mass index (BMI) 39.0-39.9, adult: Secondary | ICD-10-CM | POA: Diagnosis not present

## 2015-11-20 DIAGNOSIS — K830 Cholangitis: Secondary | ICD-10-CM | POA: Diagnosis present

## 2015-11-20 DIAGNOSIS — Z8249 Family history of ischemic heart disease and other diseases of the circulatory system: Secondary | ICD-10-CM | POA: Diagnosis not present

## 2015-11-20 DIAGNOSIS — Z923 Personal history of irradiation: Secondary | ICD-10-CM | POA: Diagnosis not present

## 2015-11-20 DIAGNOSIS — E872 Acidosis: Secondary | ICD-10-CM | POA: Diagnosis present

## 2015-11-20 DIAGNOSIS — I129 Hypertensive chronic kidney disease with stage 1 through stage 4 chronic kidney disease, or unspecified chronic kidney disease: Secondary | ICD-10-CM | POA: Diagnosis present

## 2015-11-20 DIAGNOSIS — Z809 Family history of malignant neoplasm, unspecified: Secondary | ICD-10-CM | POA: Diagnosis not present

## 2015-11-20 DIAGNOSIS — D638 Anemia in other chronic diseases classified elsewhere: Secondary | ICD-10-CM | POA: Diagnosis present

## 2015-11-20 DIAGNOSIS — L8990 Pressure ulcer of unspecified site, unspecified stage: Secondary | ICD-10-CM | POA: Diagnosis not present

## 2015-11-20 DIAGNOSIS — I248 Other forms of acute ischemic heart disease: Secondary | ICD-10-CM | POA: Diagnosis present

## 2015-11-20 DIAGNOSIS — E876 Hypokalemia: Secondary | ICD-10-CM | POA: Diagnosis present

## 2015-11-20 LAB — COMPREHENSIVE METABOLIC PANEL
ALK PHOS: 516 U/L — AB (ref 38–126)
ALT: 164 U/L — ABNORMAL HIGH (ref 14–54)
ALT: 190 U/L — ABNORMAL HIGH (ref 14–54)
ANION GAP: 9 (ref 5–15)
ANION GAP: 10 (ref 5–15)
AST: 252 U/L — ABNORMAL HIGH (ref 15–41)
AST: 274 U/L — AB (ref 15–41)
Albumin: 2 g/dL — ABNORMAL LOW (ref 3.5–5.0)
Albumin: 2.1 g/dL — ABNORMAL LOW (ref 3.5–5.0)
Alkaline Phosphatase: 605 U/L — ABNORMAL HIGH (ref 38–126)
BILIRUBIN TOTAL: 4.1 mg/dL — AB (ref 0.3–1.2)
BUN: 39 mg/dL — ABNORMAL HIGH (ref 6–20)
BUN: 40 mg/dL — ABNORMAL HIGH (ref 6–20)
CALCIUM: 7.4 mg/dL — AB (ref 8.9–10.3)
CALCIUM: 7.6 mg/dL — AB (ref 8.9–10.3)
CHLORIDE: 109 mmol/L (ref 101–111)
CO2: 18 mmol/L — ABNORMAL LOW (ref 22–32)
CO2: 20 mmol/L — ABNORMAL LOW (ref 22–32)
Chloride: 111 mmol/L (ref 101–111)
Creatinine, Ser: 3.36 mg/dL — ABNORMAL HIGH (ref 0.44–1.00)
Creatinine, Ser: 3.24 mg/dL — ABNORMAL HIGH (ref 0.44–1.00)
GFR calc non Af Amer: 12 mL/min — ABNORMAL LOW (ref >60)
GFR, EST AFRICAN AMERICAN: 14 mL/min — AB (ref >60)
GFR, EST AFRICAN AMERICAN: 14 mL/min — AB (ref >60)
GFR, EST NON AFRICAN AMERICAN: 12 mL/min — AB (ref >60)
Glucose, Bld: 234 mg/dL — ABNORMAL HIGH (ref 65–99)
Glucose, Bld: 250 mg/dL — ABNORMAL HIGH (ref 65–99)
POTASSIUM: 3.7 mmol/L (ref 3.5–5.1)
Potassium: 4 mmol/L (ref 3.5–5.1)
Sodium: 138 mmol/L (ref 135–145)
Sodium: 139 mmol/L (ref 135–145)
TOTAL PROTEIN: 5.2 g/dL — AB (ref 6.5–8.1)
TOTAL PROTEIN: 5.9 g/dL — AB (ref 6.5–8.1)
Total Bilirubin: 4.7 mg/dL — ABNORMAL HIGH (ref 0.3–1.2)

## 2015-11-20 LAB — CBC WITH DIFFERENTIAL/PLATELET
BASOS ABS: 0.1 10*3/uL (ref 0–0.1)
Basophils Relative: 0 %
EOS PCT: 0 %
Eosinophils Absolute: 0 10*3/uL (ref 0–0.7)
HCT: 30.5 % — ABNORMAL LOW (ref 35.0–47.0)
Hemoglobin: 10.1 g/dL — ABNORMAL LOW (ref 12.0–16.0)
LYMPHS PCT: 1 %
Lymphs Abs: 0.3 10*3/uL — ABNORMAL LOW (ref 1.0–3.6)
MCH: 29.2 pg (ref 26.0–34.0)
MCHC: 33 g/dL (ref 32.0–36.0)
MCV: 88.5 fL (ref 80.0–100.0)
MONO ABS: 0.7 10*3/uL (ref 0.2–0.9)
MONOS PCT: 2 %
Neutro Abs: 34.1 10*3/uL — ABNORMAL HIGH (ref 1.4–6.5)
Neutrophils Relative %: 97 %
PLATELETS: 201 10*3/uL (ref 150–440)
RBC: 3.44 MIL/uL — ABNORMAL LOW (ref 3.80–5.20)
RDW: 17.7 % — AB (ref 11.5–14.5)
WBC: 35.2 10*3/uL — ABNORMAL HIGH (ref 3.6–11.0)

## 2015-11-20 LAB — GASTROINTESTINAL PANEL BY PCR, STOOL (REPLACES STOOL CULTURE)
ADENOVIRUS F40/41: NOT DETECTED
ASTROVIRUS: NOT DETECTED
CAMPYLOBACTER SPECIES: NOT DETECTED
CRYPTOSPORIDIUM: NOT DETECTED
Cyclospora cayetanensis: NOT DETECTED
E. coli O157: NOT DETECTED
ENTEROAGGREGATIVE E COLI (EAEC): NOT DETECTED
ENTEROPATHOGENIC E COLI (EPEC): NOT DETECTED
ENTEROTOXIGENIC E COLI (ETEC): NOT DETECTED
Entamoeba histolytica: NOT DETECTED
GIARDIA LAMBLIA: NOT DETECTED
NOROVIRUS GI/GII: NOT DETECTED
PLESIMONAS SHIGELLOIDES: NOT DETECTED
Rotavirus A: NOT DETECTED
Salmonella species: NOT DETECTED
Sapovirus (I, II, IV, and V): NOT DETECTED
Shiga like toxin producing E coli (STEC): NOT DETECTED
Shigella/Enteroinvasive E coli (EIEC): NOT DETECTED
Vibrio cholerae: NOT DETECTED
Vibrio species: NOT DETECTED
YERSINIA ENTEROCOLITICA: NOT DETECTED

## 2015-11-20 LAB — CBC
HCT: 27.1 % — ABNORMAL LOW (ref 35.0–47.0)
Hemoglobin: 8.9 g/dL — ABNORMAL LOW (ref 12.0–16.0)
MCH: 29.3 pg (ref 26.0–34.0)
MCHC: 33 g/dL (ref 32.0–36.0)
MCV: 88.9 fL (ref 80.0–100.0)
PLATELETS: 188 10*3/uL (ref 150–440)
RBC: 3.05 MIL/uL — AB (ref 3.80–5.20)
RDW: 17.5 % — ABNORMAL HIGH (ref 11.5–14.5)
WBC: 34.9 10*3/uL — ABNORMAL HIGH (ref 3.6–11.0)

## 2015-11-20 LAB — URINALYSIS COMPLETE WITH MICROSCOPIC (ARMC ONLY)
Glucose, UA: NEGATIVE mg/dL
KETONES UR: NEGATIVE mg/dL
Leukocytes, UA: NEGATIVE
NITRITE: NEGATIVE
PH: 5 (ref 5.0–8.0)
Protein, ur: 100 mg/dL — AB
Specific Gravity, Urine: 1.014 (ref 1.005–1.030)

## 2015-11-20 LAB — MRSA PCR SCREENING: MRSA BY PCR: NEGATIVE

## 2015-11-20 LAB — C DIFFICILE QUICK SCREEN W PCR REFLEX
C DIFFICILE (CDIFF) INTERP: NEGATIVE
C Diff antigen: NEGATIVE
C Diff toxin: NEGATIVE

## 2015-11-20 LAB — GLUCOSE, CAPILLARY
GLUCOSE-CAPILLARY: 199 mg/dL — AB (ref 65–99)
GLUCOSE-CAPILLARY: 154 mg/dL — AB (ref 65–99)
Glucose-Capillary: 239 mg/dL — ABNORMAL HIGH (ref 65–99)
Glucose-Capillary: 178 mg/dL — ABNORMAL HIGH (ref 65–99)

## 2015-11-20 LAB — TROPONIN I
TROPONIN I: 0.16 ng/mL — AB (ref <0.031)
TROPONIN I: 0.11 ng/mL — AB (ref <0.031)
Troponin I: 0.08 ng/mL — ABNORMAL HIGH (ref <0.031)

## 2015-11-20 LAB — PROTIME-INR
INR: 1.44
INR: 1.43
PROTHROMBIN TIME: 17.6 s — AB (ref 11.4–15.0)
Prothrombin Time: 17.5 seconds — ABNORMAL HIGH (ref 11.4–15.0)

## 2015-11-20 LAB — APTT: APTT: 33 s (ref 24–36)

## 2015-11-20 LAB — BILIRUBIN, DIRECT: BILIRUBIN DIRECT: 2.5 mg/dL — AB (ref 0.1–0.5)

## 2015-11-20 LAB — LACTIC ACID, PLASMA
LACTIC ACID, VENOUS: 2.7 mmol/L — AB (ref 0.5–2.0)
Lactic Acid, Venous: 1.4 mmol/L (ref 0.5–2.0)

## 2015-11-20 LAB — PROCALCITONIN: PROCALCITONIN: 82 ng/mL

## 2015-11-20 MED ORDER — ACETAMINOPHEN 325 MG PO TABS
650.0000 mg | ORAL_TABLET | Freq: Four times a day (QID) | ORAL | Status: DC | PRN
  Administered 2015-11-23: 09:00:00 650 mg via ORAL
  Filled 2015-11-20: qty 2

## 2015-11-20 MED ORDER — INSULIN ASPART 100 UNIT/ML ~~LOC~~ SOLN
0.0000 [IU] | Freq: Every day | SUBCUTANEOUS | Status: DC
  Administered 2015-11-23: 2 [IU] via SUBCUTANEOUS
  Administered 2015-11-24: 3 [IU] via SUBCUTANEOUS
  Filled 2015-11-20: qty 2

## 2015-11-20 MED ORDER — PIPERACILLIN-TAZOBACTAM 3.375 G IVPB
3.3750 g | Freq: Two times a day (BID) | INTRAVENOUS | Status: DC
  Administered 2015-11-20: 3.375 g via INTRAVENOUS
  Filled 2015-11-20 (×2): qty 50

## 2015-11-20 MED ORDER — ONDANSETRON HCL 4 MG PO TABS: 4.0000 mg | ORAL_TABLET | Freq: Four times a day (QID) | ORAL | Status: DC | PRN

## 2015-11-20 MED ORDER — AMLODIPINE BESYLATE 10 MG PO TABS
10.0000 mg | ORAL_TABLET | Freq: Every day | ORAL | Status: DC
  Administered 2015-11-20 – 2015-11-25 (×5): 10 mg via ORAL
  Filled 2015-11-20 (×6): qty 1

## 2015-11-20 MED ORDER — VANCOMYCIN HCL 10 G IV SOLR
1250.0000 mg | INTRAVENOUS | Status: DC
  Filled 2015-11-20 (×2): qty 1250

## 2015-11-20 MED ORDER — LEVOTHYROXINE SODIUM 75 MCG PO TABS
75.0000 ug | ORAL_TABLET | Freq: Every day | ORAL | Status: DC
  Administered 2015-11-20 – 2015-11-25 (×6): 75 ug via ORAL
  Filled 2015-11-20 (×6): qty 1

## 2015-11-20 MED ORDER — SODIUM CHLORIDE 0.9 % IV BOLUS (SEPSIS)
500.0000 mL | Freq: Once | INTRAVENOUS | Status: AC
  Administered 2015-11-20: 500 mL via INTRAVENOUS

## 2015-11-20 MED ORDER — SODIUM CHLORIDE 0.9 % IV SOLN
500.0000 mg | Freq: Two times a day (BID) | INTRAVENOUS | Status: DC
  Administered 2015-11-20 – 2015-11-22 (×4): 500 mg via INTRAVENOUS
  Filled 2015-11-20 (×5): qty 0.5

## 2015-11-20 MED ORDER — HEPARIN SODIUM (PORCINE) 5000 UNIT/ML IJ SOLN
5000.0000 [IU] | Freq: Three times a day (TID) | INTRAMUSCULAR | Status: DC
  Administered 2015-11-20 – 2015-11-25 (×15): 5000 [IU] via SUBCUTANEOUS
  Filled 2015-11-20 (×15): qty 1

## 2015-11-20 MED ORDER — SODIUM CHLORIDE 0.9% FLUSH
3.0000 mL | Freq: Two times a day (BID) | INTRAVENOUS | Status: DC
  Administered 2015-11-22 – 2015-11-24 (×2): 3 mL via INTRAVENOUS

## 2015-11-20 MED ORDER — INSULIN DETEMIR 100 UNIT/ML ~~LOC~~ SOLN
12.0000 [IU] | Freq: Every day | SUBCUTANEOUS | Status: DC
  Administered 2015-11-20 – 2015-11-25 (×6): 12 [IU] via SUBCUTANEOUS
  Filled 2015-11-20 (×9): qty 0.12

## 2015-11-20 MED ORDER — ONDANSETRON HCL 4 MG/2ML IJ SOLN: 4.0000 mg | Freq: Four times a day (QID) | INTRAMUSCULAR | Status: DC | PRN

## 2015-11-20 MED ORDER — FEBUXOSTAT 40 MG PO TABS
80.0000 mg | ORAL_TABLET | Freq: Every day | ORAL | Status: DC
  Administered 2015-11-20 – 2015-11-25 (×6): 80 mg via ORAL
  Filled 2015-11-20 (×6): qty 2

## 2015-11-20 MED ORDER — SODIUM CHLORIDE 0.9 % IV SOLN
INTRAVENOUS | Status: DC
  Administered 2015-11-20 – 2015-11-21 (×3): via INTRAVENOUS

## 2015-11-20 MED ORDER — SODIUM CHLORIDE 0.9% FLUSH
10.0000 mL | INTRAVENOUS | Status: DC | PRN
  Administered 2015-11-24 – 2015-11-25 (×3): 10 mL
  Filled 2015-11-20 (×3): qty 40

## 2015-11-20 MED ORDER — PIPERACILLIN-TAZOBACTAM 3.375 G IVPB 30 MIN: 3.3750 g | Freq: Once | INTRAVENOUS | Status: DC

## 2015-11-20 MED ORDER — PANTOPRAZOLE SODIUM 40 MG PO TBEC
40.0000 mg | DELAYED_RELEASE_TABLET | Freq: Every day | ORAL | Status: DC
  Administered 2015-11-20 – 2015-11-25 (×6): 40 mg via ORAL
  Filled 2015-11-20 (×6): qty 1

## 2015-11-20 MED ORDER — ASPIRIN 81 MG PO CHEW
81.0000 mg | CHEWABLE_TABLET | Freq: Every day | ORAL | Status: DC
  Administered 2015-11-20 – 2015-11-25 (×6): 81 mg via ORAL
  Filled 2015-11-20 (×6): qty 1

## 2015-11-20 MED ORDER — SODIUM CHLORIDE 0.9 % IV BOLUS (SEPSIS)
1000.0000 mL | Freq: Once | INTRAVENOUS | Status: AC
  Administered 2015-11-20: 1000 mL via INTRAVENOUS

## 2015-11-20 MED ORDER — VANCOMYCIN HCL IN DEXTROSE 1-5 GM/200ML-% IV SOLN: 1000.0000 mg | Freq: Once | INTRAVENOUS | Status: DC

## 2015-11-20 MED ORDER — SODIUM CHLORIDE 0.9 % IV BOLUS (SEPSIS)
1000.0000 mL | INTRAVENOUS | Status: DC
  Administered 2015-11-20 (×2): 1000 mL via INTRAVENOUS

## 2015-11-20 MED ORDER — MORPHINE SULFATE (PF) 2 MG/ML IV SOLN: 2.0000 mg | INTRAVENOUS | Status: DC | PRN

## 2015-11-20 MED ORDER — METOPROLOL SUCCINATE ER 50 MG PO TB24
100.0000 mg | ORAL_TABLET | Freq: Every day | ORAL | Status: DC
  Administered 2015-11-20 – 2015-11-25 (×6): 100 mg via ORAL
  Filled 2015-11-20 (×6): qty 2

## 2015-11-20 MED ORDER — OXYCODONE HCL 5 MG PO TABS
5.0000 mg | ORAL_TABLET | Freq: Four times a day (QID) | ORAL | Status: DC | PRN
Start: 2015-11-20 — End: 2015-11-25

## 2015-11-20 MED ORDER — INSULIN ASPART 100 UNIT/ML ~~LOC~~ SOLN
0.0000 [IU] | Freq: Three times a day (TID) | SUBCUTANEOUS | Status: DC
  Administered 2015-11-20 – 2015-11-21 (×3): 2 [IU] via SUBCUTANEOUS
  Administered 2015-11-22 – 2015-11-23 (×3): 1 [IU] via SUBCUTANEOUS
  Administered 2015-11-23: 3 [IU] via SUBCUTANEOUS
  Administered 2015-11-23: 09:00:00 1 [IU] via SUBCUTANEOUS
  Administered 2015-11-24: 12:00:00 5 [IU] via SUBCUTANEOUS
  Administered 2015-11-24: 08:00:00 1 [IU] via SUBCUTANEOUS
  Administered 2015-11-24: 7 [IU] via SUBCUTANEOUS
  Administered 2015-11-25: 5 [IU] via SUBCUTANEOUS
  Administered 2015-11-25: 08:00:00 1 [IU] via SUBCUTANEOUS
  Filled 2015-11-20: qty 2
  Filled 2015-11-20 (×2): qty 1
  Filled 2015-11-20: qty 3
  Filled 2015-11-20: qty 5
  Filled 2015-11-20: qty 2
  Filled 2015-11-20: qty 3
  Filled 2015-11-20 (×3): qty 1
  Filled 2015-11-20: qty 5
  Filled 2015-11-20: qty 7
  Filled 2015-11-20: qty 1
  Filled 2015-11-20: qty 2

## 2015-11-20 NOTE — Consult Note (Signed)
CODE SEPSIS CALLED Patient with symptoms of gastroenteritis, volume depletion, patient given 2.5 L NS LA trend  10.5>>>8.7>>>2.7  Patient not on vasopressors, awake and alert. To be transferred to gen med floor this afternoon

## 2015-11-20 NOTE — ED Notes (Signed)
Return from ct scan.  Pt alert. Iv fluids infusing and meds infusing.  nsr on moitor.  Family with pt.

## 2015-11-20 NOTE — ED Notes (Signed)
Lab called with lactic acid of 8.7   Dr Marcelene Butte aware.

## 2015-11-20 NOTE — Progress Notes (Signed)
[facility] NAME: Pamela Preston    MR#:  [id number]  DATE OF BIRTH:  Dec 30, 1929  SUBJECTIVE:  CHIEF COMPLAINT:   Chief Complaint  Patient presents with  . Weakness   Admitted for weakness and abdominal pain. Found to have fever with elevated liver enzymes and WBC.  Abdominal pain is mildly improved.  REVIEW OF SYSTEMS:    Review of Systems  Constitutional: Positive for fever, chills and malaise/fatigue.  HENT: Negative for sore throat.   Eyes: Negative for blurred vision, double vision and pain.  Respiratory: Negative for cough, hemoptysis, shortness of breath and wheezing.   Cardiovascular: Negative for chest pain, palpitations, orthopnea and leg swelling.  Gastrointestinal: Positive for nausea, abdominal pain and diarrhea. Negative for heartburn, vomiting and constipation.  Genitourinary: Negative for dysuria and hematuria.  Musculoskeletal: Negative for back pain and joint pain.  Skin: Negative for rash.  Neurological: Positive for weakness. Negative for sensory change, speech change, focal weakness and headaches.  Endo/Heme/Allergies: Does not bruise/bleed easily.  Psychiatric/Behavioral: Negative for depression. The patient is not nervous/anxious.     DRUG ALLERGIES:   Allergies  Allergen Reactions  . Colchicine   . Ketorolac   . Lactase Diarrhea  . Lipitor [Atorvastatin]     VITALS:  Blood pressure 136/54, pulse 89, temperature 97.6 F (36.4 C), temperature source Oral, resp. rate 17, height 5\' 8"  (1.727 m), weight 99.791 kg (220 lb), SpO2 100 %.  PHYSICAL EXAMINATION:   Physical Exam  GENERAL:  82 y.o.-year-old patient lying in the bed with no acute distress.  EYES: Pupils equal, round, reactive to light and accommodation.  Extraocular muscles intact. Icterus positive HEENT: Head atraumatic, normocephalic. Oropharynx and nasopharynx clear.  NECK:  Supple, no jugular venous distention. No thyroid  enlargement, no tenderness.  LUNGS: Normal breath sounds bilaterally, no wheezing, rales, rhonchi. No use of accessory muscles of respiration.  CARDIOVASCULAR: S1, S2 normal. No murmurs, rubs, or gallops.  ABDOMEN: Soft, Tenderness upper abdomen. Nondistended. Bowel sounds present EXTREMITIES: No cyanosis, clubbing or edema b/l.    NEUROLOGIC: Cranial nerves II through XII are intact. No focal Motor or sensory deficits b/l.   PSYCHIATRIC: The patient is alert and oriented x 3.  SKIN: No obvious rash, lesion, or ulcer.   LABORATORY PANEL:   CBC  Recent Labs Lab 11/20/15 0821  WBC 35.2*  HGB 10.1*  HCT 30.5*  PLT 201   ------------------------------------------------------------------------------------------------------------------ Chemistries   Recent Labs Lab 11/15/15 1158  11/20/15 0821  NA 137  < > 139  K 3.2*  < > 3.7  CL 104  < > 109  CO2 25  < > 20*  GLUCOSE 88  < > 250*  BUN 23*  < > 40*  CREATININE 1.90*  < > 3.24*  CALCIUM 8.6*  < > 7.6*  MG 1.7  --   --   AST 139*  < > 274*  ALT 138*  < > 190*  ALKPHOS 634*  < > 605*  BILITOT 1.6*  < > 4.7*  < > = values in this interval not displayed. ------------------------------------------------------------------------------------------------------------------  Cardiac Enzymes  Recent Labs Lab 11/20/15 0821  TROPONINI 0.16*   ------------------------------------------------------------------------------------------------------------------  RADIOLOGY:  Ct Abdomen Pelvis Wo Contrast  11/20/2015  CLINICAL DATA:  Currently receiving treatments for new diagnosis of pancreatic cancer. Worsening weakness today. Hypotensive and tachypnea. EXAM: CT ABDOMEN AND PELVIS WITHOUT CONTRAST TECHNIQUE: Multidetector CT imaging of the abdomen and pelvis was performed  following the standard protocol without IV contrast. COMPARISON:  08/31/2015. FINDINGS: There is a biliary stent which appears satisfactorily positioned. There are  unremarkable unenhanced appearances of the liver, spleen and adrenals. Moderate fullness in the pancreatic head is unchanged. Pancreatic body and tail are unremarkable. Multiple focal renal lesions are present without significant interval change. These may represent benign cysts but they are not characterized in the absence of intravenous contrast. Collecting systems, ureters and urinary bladder are unremarkable. Multiple small bowel diverticula are present, not significantly changed. Colonic diverticulosis is present, not significantly changed. No evidence of bowel obstruction. No extraluminal air. No acute inflammatory changes are evident in the abdomen or pelvis. There is no ascites. The abdominal aorta is normal in caliber with mild atherosclerotic calcification. Mild patchy and linear opacities are present in both lung bases, likely atelectatic. This is new from 08/31/2015. IMPRESSION: 1. No acute findings are evident in the abdomen or pelvis. 2. Satisfactorily positioned biliary stent. 3. Mild atelectatic opacities in both lung bases. Electronically Signed   By: Andreas Newport M.D.   On: 11/20/2015 00:35   Dg Chest Portable 1 View  11/19/2015  CLINICAL DATA:  Patient was recently dx with Patient was so weak today she was unable to get back into bed from bathroom, fell to floor. Patient was hypotensive and tachypnic upon arrival EXAM: PORTABLE CHEST 1 VIEW COMPARISON:  06/16/2005 FINDINGS: There is a right sided port-a-cath. There is no focal parenchymal opacity. There is no pleural effusion or pneumothorax. The heart and mediastinal contours are unremarkable. There is mild osteoarthritis of the left glenohumeral joint. IMPRESSION: No active disease. Electronically Signed   By: Kathreen Devoid   On: 11/19/2015 20:26     ASSESSMENT AND PLAN:   * Severe Sepsis - POA Likely due to acute cholangitis On IV antibiotics Blood cultures pending GI consult for ERCP She does have a biliary stent in place.  Possibility of occlusion. Bolus normal saline due to elevated lactic acid stat. Patient is critically ill which have discussed with patient and sister at bedside, and high risk for deterioration or cardiac arrest or death. She has wished to be full code.  * Abnormal liver function tests and jaundice Due to about  * Acute on chronic renal failure due to ATN from sepsis Improving with IV fluids. Continue fluids Monitor input and output  * Abnormal troponin which could be secondary to demand ischemia and sepsis Repeat  * Pancreatic cancer Recently getting chemotherapy. Outpatient follow-up with oncology  * Anemia of chronic disease  * Insulin-dependent diabetes mellitus Start Lantus at 12 units daily. Sliding scale insulin Check HbA1c  * DVT prophylaxis with heparin  All the records are reviewed and case discussed with Care Management/Social Workerr. Management plans discussed with the patient, family and they are in agreement.  CODE STATUS: FULL  DVT Prophylaxis: SCDs  TOTAL CC TIME TAKING CARE OF THIS PATIENT: 30 minutes.   POSSIBLE D/C IN 2-3 DAYS, DEPENDING ON CLINICAL CONDITION.  Hillary Bow R M.D on 11/20/2015 at 11:04 AM  Between 7am to 6pm - Pager - [phone]  After 6pm go to [email or web address] - password [email or web address] [facility]  Office  [phone]  CC: Primary care physician; [name], MD  Note: This dictation was prepared with Dragon dictation along with smaller phrase technology. Any transcriptional errors that result from this process are unintentional.

## 2015-11-20 NOTE — Progress Notes (Signed)
Pharmacy Antibiotic Note  Pamela Preston is a 82 y.o. female admitted on 11/19/2015 with sepsis.  Pharmacy has been consulted for vancomycin and Zosyn dosing.Biofire results with E coli.   Plan: Per discussion with Dr. Mortimer Fries, will change antibiotics to meropenem. Will order meropenem 500 mg iv q 12 hours adjusted for renal function.   Height: 5\' 8"  (172.7 cm) Weight: 220 lb (99.791 kg) IBW/kg (Calculated) : 63.9  Temp (24hrs), Avg:97.9 F (36.6 C), Min:97.6 F (36.4 C), Max:98.3 F (36.8 C)   Recent Labs Lab 11/15/15 1158 11/19/15 2014 11/19/15 2252 11/20/15 0445 11/20/15 0821 11/20/15 1128  WBC 7.0 19.6*  --  34.9* 35.2*  --   CREATININE 1.90* 3.51*  --  3.36* 3.24*  --   LATICACIDVEN  --  10.5* 8.7*  --  2.7* 1.4    Estimated Creatinine Clearance: 16.3 mL/min (by C-G formula based on Cr of 3.24).    Allergies  Allergen Reactions  . Colchicine   . Ketorolac   . Lactase Diarrhea  . Lipitor [Atorvastatin]     Antimicrobials this admission: Zosyn 4/3 >> 4/4 Vancomycin 4/4 >> 4/4 Meropenem 4/4  >>   Dose adjustments this admission:  Microbiology results: 4/4 BCx: E coli (Biofire) 4/4 MRSA PCR: negative  4/4 UA: (-) 4/3 CXR: no acute disease  Thank you for allowing pharmacy to be a part of this patient's care.  Ulice Dash D 11/20/2015 3:01 PM

## 2015-11-20 NOTE — ED Notes (Signed)
Iv fluids infusing  Pt alert.  nsr on monitor.  Skin warm and dry.

## 2015-11-20 NOTE — Consult Note (Signed)
Consultation  Referring Provider:     Dr Jannette Spanner Admit date: 11/19/15 Consult date: 11/20/15         Reason for Consultation:    Jaundice/elevated lfts          HPI:   Pamela Preston is a 82 y.o. female  With history of CKD stage 4, breast (2013)/cervical cancer and recent diagnosis of T2N0 Stage 1b adenocarcinoma of the pancreas (2/17) who has received  5FU infusion therapy- this is a continuous infusion that she receives weekly, last reloaded 3/301/7, with Dr Christin Fudge.She also has had a few radiation treatments with Dr Baruch Gouty, last was Friday. She also has CBD stent placed in January by Dr [facility] for jaundice/elevated lfts/CBD dilation 1/17 and is due to change this tomorrow via ERCP already scheduled.  She has been recently admitted with sepsis, 4/10 abdominal pain, and elevated bilirubin/lfts, which have been increasing over the last month, bilirubin and ALP have significantly risen over the last month. She has received IVF is on vancomycin and zosyn. She is afebrile and is hemodynamically stable.  Patient reports that she has been feeling weak since starting radiation- states this exacerbated yesterday, along with some intermittent lower/ruq abdominal pain and chills yesterday, so radiation was held and she was sent to ED for further evaluation.  States she has had some intermittent NV as well. Has had 1-2 loose stools/d for several weeks, although this is an improvement from January, and has had no bowel movement today. Says she feels better today than yesterday.  States she has had no sick contacts. Has had negative c-diff test yesterday. CT yesterdat with pancreatic head fullness, and satisfactorily placed biliary stent.  PREVIOUS ENDOSCOPIES:            Colonoscopy last done 2013 (diverticulosis for adenomatous colon polyps (found 2003).  Serologies 09/01/15 negative for HCV, and there was no IgM for HAV/HBV. CMV negative. Had IgG tom EBV, indicating past infection.   Past Medical  History  Diagnosis Date  . Hypertension   . GERD (gastroesophageal reflux disease)   . Diabetes mellitus without complication ([place])   . Breast cancer ([place]) 2013    c Lumpectomy 2013  . Uterine cancer ([place]) 1960  . Pancreatic cancer ([place])   . Abnormal LFTs     Past Surgical History  Procedure Laterality Date  . Abdominal hysterectomy    . Knee arthroscopy    . Ercp N/A 09/04/2015    Procedure: ENDOSCOPIC RETROGRADE CHOLANGIOPANCREATOGRAPHY (ERCP);  Surgeon: Hulen Luster, MD;  Location: [facility] ENDOSCOPY;  Service: Gastroenterology;  Laterality: N/A;  . Breast biopsy Right 2012    Positive  . Upper esophageal endoscopic ultrasound (eus) N/A 10/04/2015    Procedure: UPPER ESOPHAGEAL ENDOSCOPIC ULTRASOUND (EUS);  Surgeon: Holly Bodily, MD;  Location: [facility] ENDOSCOPY;  Service: Gastroenterology;  Laterality: N/A;  . Peripheral vascular catheterization N/A 11/05/2015    Procedure: Porta Cath Insertion;  Surgeon: Algernon Huxley, MD;  Location: [place] CV LAB;  Service: Cardiovascular;  Laterality: N/A;  left knee replacment  Family History  Problem Relation Age of Onset  . Cancer Mother   . Heart disease Brother     Social History  Substance Use Topics  . Smoking status: Never Smoker   . Smokeless tobacco: None  . Alcohol Use: No    Prior to Admission medications   Medication Sig Start Date End Date Taking? Authorizing Provider  amLODipine (NORVASC) 10 MG tablet Take 10 mg by mouth daily.  Yes Historical Provider, MD  aspirin 81 MG tablet Take 81 mg by mouth daily.   Yes Historical Provider, MD  febuxostat (ULORIC) 40 MG tablet Take 80 mg by mouth daily.   Yes Historical Provider, MD  furosemide (LASIX) 20 MG tablet Take 20 mg by mouth daily.   Yes Historical Provider, MD  glipiZIDE (GLUCOTROL) 5 MG tablet Take 5 mg by mouth daily before breakfast.    Yes Historical Provider, MD  HYDROcodone-acetaminophen (NORCO/VICODIN) 5-325 MG tablet Take 1 tablet by mouth every 6 (six)  hours as needed for moderate pain.   Yes Historical Provider, MD  letrozole (FEMARA) 2.5 MG tablet Take 2.5 mg by mouth daily.   Yes Historical Provider, MD  levothyroxine (SYNTHROID, LEVOTHROID) 75 MCG tablet Take 75 mcg by mouth daily before breakfast.   Yes Historical Provider, MD  lisinopril (PRINIVIL,ZESTRIL) 40 MG tablet Take 40 mg by mouth daily.   Yes Historical Provider, MD  loperamide (IMODIUM) 2 MG capsule TK 1 C PO Q 6 H PRN FOR DIARRHEA OR LOOSE STOOLS 09/07/15  Yes Historical Provider, MD  metoprolol succinate (TOPROL-XL) 100 MG 24 hr tablet Take 100 mg by mouth daily. Take with or immediately following a meal.   Yes Historical Provider, MD  omeprazole (PRILOSEC) 20 MG capsule Take 20 mg by mouth daily.   Yes Historical Provider, MD  potassium chloride 20 MEQ TBCR Take 20 mEq by mouth 2 (two) times daily. 11/15/15  Yes Cammie Sickle, MD  pravastatin (PRAVACHOL) 40 MG tablet Take 40 mg by mouth daily.   Yes Historical Provider, MD  insulin glargine (LANTUS) 100 UNIT/ML injection Inject 25 Units into the skin 2 (two) times daily.     Historical Provider, MD    Current Facility-Administered Medications  Medication Dose Route Frequency Provider Last Rate Last Dose  . 0.9 %  sodium chloride infusion   Intravenous Continuous Saundra Shelling, MD 75 mL/hr at 11/20/15 0208    . amLODipine (NORVASC) tablet 10 mg  10 mg Oral Daily Saundra Shelling, MD   10 mg at 11/20/15 1019  . aspirin chewable tablet 81 mg  81 mg Oral Daily Pavan Pyreddy, MD   81 mg at 11/20/15 1019  . febuxostat (ULORIC) tablet 80 mg  80 mg Oral Daily Pavan Pyreddy, MD   80 mg at 11/20/15 1019  . heparin injection 5,000 Units  5,000 Units Subcutaneous 3 times per day Saundra Shelling, MD   5,000 Units at 11/20/15 0205  . insulin aspart (novoLOG) injection 0-5 Units  0-5 Units Subcutaneous QHS Srikar Sudini, MD      . insulin aspart (novoLOG) injection 0-9 Units  0-9 Units Subcutaneous TID WC Srikar Sudini, MD      . insulin  detemir (LEVEMIR) injection 12 Units  12 Units Subcutaneous Daily Srikar Sudini, MD      . levothyroxine (SYNTHROID, LEVOTHROID) tablet 75 mcg  75 mcg Oral QAC breakfast Saundra Shelling, MD   75 mcg at 11/20/15 0801  . metoprolol succinate (TOPROL-XL) 24 hr tablet 100 mg  100 mg Oral Daily Pavan Pyreddy, MD   100 mg at 11/20/15 1020  . morphine 2 MG/ML injection 2 mg  2 mg Intravenous Q4H PRN Pavan Pyreddy, MD      . ondansetron (ZOFRAN) tablet 4 mg  4 mg Oral Q6H PRN Pavan Pyreddy, MD       Or  . ondansetron (ZOFRAN) injection 4 mg  4 mg Intravenous Q6H PRN Saundra Shelling, MD      .  pantoprazole (PROTONIX) EC tablet 40 mg  40 mg Oral Daily Pavan Pyreddy, MD   40 mg at 11/20/15 1019  . piperacillin-tazobactam (ZOSYN) IVPB 3.375 g  3.375 g Intravenous Q12H Pavan Pyreddy, MD   3.375 g at 11/20/15 1020  . sodium chloride flush (NS) 0.9 % injection 3 mL  3 mL Intravenous Q12H Saundra Shelling, MD   3 mL at 11/20/15 0206  . vancomycin (VANCOCIN) 1,250 mg in sodium chloride 0.9 % 250 mL IVPB  1,250 mg Intravenous Q48H Pavan Pyreddy, MD       Facility-Administered Medications Ordered in Other Encounters  Medication Dose Route Frequency Provider Last Rate Last Dose  . 0.9 %  sodium chloride infusion   Intravenous Continuous Forest Gleason, MD   Stopped at 11/08/15 1144  . heparin lock flush 100 unit/mL  500 Units Intracatheter Once PRN Forest Gleason, MD      . sodium chloride flush (NS) 0.9 % injection 10 mL  10 mL Intracatheter PRN Forest Gleason, MD   10 mL at 11/08/15 1052    Allergies as of 11/19/2015 - Review Complete 11/19/2015  Allergen Reaction Noted  . Colchicine  01/24/2013  . Ketorolac  01/24/2013  . Lactase Diarrhea 10/16/2015  . Lipitor [atorvastatin]  01/24/2013     Review of Systems:    All systems reviewed and negative except where noted in HPI, with the exception of mild intermittent chest pain, mild dysuria,  and cough.    Physical Exam:  Vital signs in last 24 hours: Temp:  [97.6 F  (36.4 C)-98.3 F (36.8 C)] 97.6 F (36.4 C) (04/04 0800) Pulse Rate:  [64-110] 81 (04/04 1200) Resp:  [17-39] 20 (04/04 1200) BP: (82-164)/(31-129) 114/66 mmHg (04/04 1200) SpO2:  [88 %-100 %] 98 % (04/04 1200) Weight:  [99.791 kg (220 lb)] 99.791 kg (220 lb) (04/03 1933) Last BM Date: 11/20/15 General:   Pleasant woman in NAD Head:  Normocephalic and atraumatic. Eyes:   Icterus.   Conjunctiva pink. Ears:  Normal auditory acuity. Mouth: Mucosa pink moist, no lesions. Mms dry. Neck:  Supple; no masses felt Lungs: Respirations even and unlabored. Lungs clear to auscultation bilaterally.Bilaterally diminished bases.   No wheezes, crackles, or rhonchi.  Heart:  S1S2, RRR, no MRG. No edema. Abdomen:   Flat, soft, nondistended, mild ruq and suprapubic tenderness. Normal bowel sounds. No appreciable masses or hepatomegaly. No rebound signs or other peritoneal signs. There is a rectal tube with tan and brown loose stool present. Msk:  MAEW x4, No clubbing or cyanosis. Strength 5/5. Symmetrical without gross deformities. Neurologic:  Alert and  oriented x4;  Cranial nerves II-XII intact.  Skin:  Warm, dry, pink without significant lesions or rashes. Psych:  Alert and cooperative. Normal affect.  LAB RESULTS:  Recent Labs  11/19/15 2014 11/20/15 0445 11/20/15 0821  WBC 19.6* 34.9* 35.2*  HGB 11.0* 8.9* 10.1*  HCT 33.0* 27.1* 30.5*  PLT 226 188 201   BMET  Recent Labs  11/19/15 2014 11/20/15 0445 11/20/15 0821  NA 137 138 139  K 3.5 4.0 3.7  CL 105 111 109  CO2 14* 18* 20*  GLUCOSE 166* 234* 250*  BUN 41* 39* 40*  CREATININE 3.51* 3.36* 3.24*  CALCIUM 8.6* 7.4* 7.6*   LFT  Recent Labs  11/20/15 0821  PROT 5.9*  ALBUMIN 2.1*  AST 274*  ALT 190*  ALKPHOS 605*  BILITOT 4.7*   PT/INR  Recent Labs  11/20/15 0445 11/20/15 0821  LABPROT 17.6* 17.5*  INR  1.44 1.43    STUDIES: Ct Abdomen Pelvis Wo Contrast  11/20/2015  CLINICAL DATA:  Currently receiving  treatments for new diagnosis of pancreatic cancer. Worsening weakness today. Hypotensive and tachypnea. EXAM: CT ABDOMEN AND PELVIS WITHOUT CONTRAST TECHNIQUE: Multidetector CT imaging of the abdomen and pelvis was performed following the standard protocol without IV contrast. COMPARISON:  08/31/2015. FINDINGS: There is a biliary stent which appears satisfactorily positioned. There are unremarkable unenhanced appearances of the liver, spleen and adrenals. Moderate fullness in the pancreatic head is unchanged. Pancreatic body and tail are unremarkable. Multiple focal renal lesions are present without significant interval change. These may represent benign cysts but they are not characterized in the absence of intravenous contrast. Collecting systems, ureters and urinary bladder are unremarkable. Multiple small bowel diverticula are present, not significantly changed. Colonic diverticulosis is present, not significantly changed. No evidence of bowel obstruction. No extraluminal air. No acute inflammatory changes are evident in the abdomen or pelvis. There is no ascites. The abdominal aorta is normal in caliber with mild atherosclerotic calcification. Mild patchy and linear opacities are present in both lung bases, likely atelectatic. This is new from 08/31/2015. IMPRESSION: 1. No acute findings are evident in the abdomen or pelvis. 2. Satisfactorily positioned biliary stent. 3. Mild atelectatic opacities in both lung bases. Electronically Signed   By: Andreas Newport M.D.   On: 11/20/2015 00:35   Dg Chest Portable 1 View  11/19/2015  CLINICAL DATA:  Patient was recently dx with Patient was so weak today she was unable to get back into bed from bathroom, fell to floor. Patient was hypotensive and tachypnic upon arrival EXAM: PORTABLE CHEST 1 VIEW COMPARISON:  06/16/2005 FINDINGS: There is a right sided port-a-cath. There is no focal parenchymal opacity. There is no pleural effusion or pneumothorax. The heart and  mediastinal contours are unremarkable. There is mild osteoarthritis of the left glenohumeral joint. IMPRESSION: No active disease. Electronically Signed   By: Kathreen Devoid   On: 11/19/2015 20:26       Impression / Plan:   1. Obstructive jaundice/elevated lfts/adenocarcinoma of the pancreas: there is concern for cholangitis/possible occlusion of stent v. Other. Will plan for ERCp tomorrow afternoon, is on vanc and zosyn- will discuss further with Dr Gustavo Lah. With recent flare of lfts after chemotherapy administration, will reassess for HAV/HBV. Lipase has been normal. Recommend involving her oncologist, Dr Rogue Bussing for guidance with her cancer and chemotherapy- patient states her infusion pump was removed yesterday due to the sepsis. 2. Chest pain/ abnormal troponins- cardiology consult  Thank you very much for this consult. These services were provided by Stephens November, NP-C, in collaboration with Lollie Sails, MD, with whom I have discussed this patient in full.   Stephens November, NP-C

## 2015-11-20 NOTE — Progress Notes (Signed)
Inpatient Diabetes Program Recommendations  AACE/ADA: New Consensus Statement on Inpatient Glycemic Control (2015)  Target Ranges:  Prepandial:   less than 140 mg/dL      Peak postprandial:   less than 180 mg/dL (1-2 hours)      Critically ill patients:  140 - 180 mg/dL  Results for DORE, DIEKMAN (MRN [id number]) as of 11/20/2015 10:19  Ref. Range 11/20/2015 06:27  Glucose-Capillary Latest Ref Range: 65-99 mg/dL 239 (H)  Results for PENLEY, LEGREE (MRN [id number]) as of 11/20/2015 10:19  Ref. Range 11/19/2015 20:14 11/20/2015 04:45  Glucose Latest Ref Range: 65-99 mg/dL 166 (H) 234 (H)   Review of Glycemic Control  Diabetes history: DM2 Outpatient Diabetes medications: Lantus 25 units BID, Glipizide 5 mg QAM Current orders for Inpatient glycemic control: none  Inpatient Diabetes Program Recommendations: Correction (SSI): Patient has a history of DM2 and takes insulin and oral DM medications as an outpatient. Please consider ordering CBGs with Novolog correction scale ACHS. HgbA1C: Please consider ordering an A1C to evaluate glycemic control over the past 2-3 months.  Thanks, Barnie Alderman, RN, MSN, CDE Diabetes Coordinator Inpatient Diabetes Program [phone] (Team Pager from [place] to [place]) [phone] ([place] office) [phone] [facility] office) [phone] [facility] office)

## 2015-11-20 NOTE — H&P (Signed)
[facility] [place] NAME: Pamela Preston    MR#:  [id number]  DATE OF BIRTH:  January 30, 1930  DATE OF ADMISSION:  11/19/2015  PRIMARY CARE PHYSICIAN: [name], MD   REQUESTING/REFERRING PHYSICIAN:   CHIEF COMPLAINT:   Chief Complaint  Patient presents with  . Weakness    HISTORY OF PRESENT ILLNESS: Pamela Preston  is a 82 y.o. female with a known history of Pancreatic cancer, breast cancer, uterine cancer, hypertension, GERD, biliary stent presented to the emergency room with weakness and abdominal pain for the last few days. He should also had some nausea and vomiting. No history of hematemesis hemoptysis. Abdominal pain is generalized 4 out of 10 on a scale of 1-10 and aching in nature. Patient was evaluated in the emergency room was found to have elevated bilirubin and elevated liver function tests. Patient bedside lactate level was also very high. No history of any chest pain. No history of any shortness of breath. No history of any rectal bleed. Patient had ERCP done in January 2017 and also has biliary stent.  PAST MEDICAL HISTORY:   Past Medical History  Diagnosis Date  . Hypertension   . GERD (gastroesophageal reflux disease)   . Diabetes mellitus without complication ([place])   . Breast cancer ([place]) 2013    c Lumpectomy 2013  . Uterine cancer ([place]) 1960  . Pancreatic cancer ([place])   . Abnormal LFTs     PAST SURGICAL HISTORY: Past Surgical History  Procedure Laterality Date  . Abdominal hysterectomy    . Knee arthroscopy    . Ercp N/A 09/04/2015    Procedure: ENDOSCOPIC RETROGRADE CHOLANGIOPANCREATOGRAPHY (ERCP);  Surgeon: Hulen Luster, MD;  Location: [facility] ENDOSCOPY;  Service: Gastroenterology;  Laterality: N/A;  . Breast biopsy Right 2012    Positive  . Upper esophageal endoscopic ultrasound (eus) N/A 10/04/2015    Procedure: UPPER ESOPHAGEAL ENDOSCOPIC ULTRASOUND (EUS);  Surgeon: Holly Bodily, MD;  Location: [facility]  ENDOSCOPY;  Service: Gastroenterology;  Laterality: N/A;  . Peripheral vascular catheterization N/A 11/05/2015    Procedure: Porta Cath Insertion;  Surgeon: Algernon Huxley, MD;  Location: [place] CV LAB;  Service: Cardiovascular;  Laterality: N/A;    SOCIAL HISTORY:  Social History  Substance Use Topics  . Smoking status: Never Smoker   . Smokeless tobacco: Not on file  . Alcohol Use: No    FAMILY HISTORY:  Family History  Problem Relation Age of Onset  . Cancer Mother   . Heart disease Brother     DRUG ALLERGIES:  Allergies  Allergen Reactions  . Colchicine   . Ketorolac   . Lactase Diarrhea  . Lipitor [Atorvastatin]     REVIEW OF SYSTEMS:   CONSTITUTIONAL: No fever, has generalized weakness.  EYES: No blurred or double vision.Has icterus.  EARS, NOSE, AND THROAT: No tinnitus or ear pain.  RESPIRATORY: No cough, shortness of breath, wheezing or hemoptysis.  CARDIOVASCULAR: No chest pain, orthopnea, edema.  GASTROINTESTINAL: Has nausea, vomiting, no diarrhea , has abdominal pain.  GENITOURINARY: No dysuria, hematuria.  ENDOCRINE: No polyuria, nocturia,  HEMATOLOGY: No anemia, easy bruising or bleeding SKIN: No rash or lesion. MUSCULOSKELETAL: No joint pain or arthritis.   NEUROLOGIC: No tingling, numbness, weakness.  PSYCHIATRY: No anxiety or depression.   MEDICATIONS AT HOME:  Prior to Admission medications   Medication Sig Start Date End Date Taking? Authorizing Provider  amLODipine (NORVASC) 10 MG tablet Take 10 mg by mouth  daily.   Yes Historical Provider, MD  aspirin 81 MG tablet Take 81 mg by mouth daily.   Yes Historical Provider, MD  febuxostat (ULORIC) 40 MG tablet Take 80 mg by mouth daily.   Yes Historical Provider, MD  furosemide (LASIX) 20 MG tablet Take 20 mg by mouth daily.   Yes Historical Provider, MD  glipiZIDE (GLUCOTROL) 5 MG tablet Take 5 mg by mouth daily before breakfast.    Yes Historical Provider, MD  HYDROcodone-acetaminophen  (NORCO/VICODIN) 5-325 MG tablet Take 1 tablet by mouth every 6 (six) hours as needed for moderate pain.   Yes Historical Provider, MD  letrozole (FEMARA) 2.5 MG tablet Take 2.5 mg by mouth daily.   Yes Historical Provider, MD  levothyroxine (SYNTHROID, LEVOTHROID) 75 MCG tablet Take 75 mcg by mouth daily before breakfast.   Yes Historical Provider, MD  lisinopril (PRINIVIL,ZESTRIL) 40 MG tablet Take 40 mg by mouth daily.   Yes Historical Provider, MD  loperamide (IMODIUM) 2 MG capsule TK 1 C PO Q 6 H PRN FOR DIARRHEA OR LOOSE STOOLS 09/07/15  Yes Historical Provider, MD  metoprolol succinate (TOPROL-XL) 100 MG 24 hr tablet Take 100 mg by mouth daily. Take with or immediately following a meal.   Yes Historical Provider, MD  omeprazole (PRILOSEC) 20 MG capsule Take 20 mg by mouth daily.   Yes Historical Provider, MD  potassium chloride 20 MEQ TBCR Take 20 mEq by mouth 2 (two) times daily. 11/15/15  Yes Cammie Sickle, MD  pravastatin (PRAVACHOL) 40 MG tablet Take 40 mg by mouth daily.   Yes Historical Provider, MD  insulin glargine (LANTUS) 100 UNIT/ML injection Inject 25 Units into the skin 2 (two) times daily.     Historical Provider, MD      PHYSICAL EXAMINATION:   VITAL SIGNS: Blood pressure 95/48, pulse 87, temperature 98.3 F (36.8 C), temperature source Oral, resp. rate 27, height 5\' 8"  (1.727 m), weight 99.791 kg (220 lb), SpO2 97 %.  GENERAL:  82 y.o.-year-old patient lying in the bed in mild distress.  EYES: Pupils equal, round, reactive to light and accommodation. Has scleral icterus. Extraocular muscles intact.  HEENT: Head atraumatic, normocephalic. Oropharynx and nasopharynx clear.  NECK:  Supple, no jugular venous distention. No thyroid enlargement, no tenderness.  LUNGS: Normal breath sounds bilaterally, no wheezing, rales,rhonchi or crepitation. No use of accessory muscles of respiration.  CARDIOVASCULAR: S1, S2 normal. No murmurs, rubs, or gallops.  ABDOMEN: Soft,  obese,tenderness around umbilicus, nondistended. Bowel sounds present. No organomegaly or mass.  EXTREMITIES: No pedal edema, cyanosis, or clubbing.  NEUROLOGIC: Cranial nerves II through XII are intact. Muscle strength 5/5 in all extremities. Sensation intact. Gait not checked.  PSYCHIATRIC: The patient is alert and oriented x 3.  SKIN: No obvious rash, lesion, or ulcer.   LABORATORY PANEL:   CBC  Recent Labs Lab 11/15/15 1158 11/19/15 2014  WBC 7.0 19.6*  HGB 11.1* 11.0*  HCT 32.6* 33.0*  PLT 333 226  MCV 88.3 90.9  MCH 30.0 30.4  MCHC 33.9 33.5  RDW 16.6* 17.4*  LYMPHSABS 1.5 0.2*  MONOABS 0.6 0.2  EOSABS 0.1 0.0  BASOSABS 0.1 0.0   ------------------------------------------------------------------------------------------------------------------  Chemistries   Recent Labs Lab 11/15/15 1158 11/19/15 2014  NA 137 137  K 3.2* 3.5  CL 104 105  CO2 25 14*  GLUCOSE 88 166*  BUN 23* 41*  CREATININE 1.90* 3.51*  CALCIUM 8.6* 8.6*  MG 1.7  --   AST 139* 311*  ALT 138* 210*  ALKPHOS 634* 798*  BILITOT 1.6* 6.3*   ------------------------------------------------------------------------------------------------------------------ estimated creatinine clearance is 15 mL/min (by C-G formula based on Cr of 3.51). ------------------------------------------------------------------------------------------------------------------ No results for input(s): TSH, T4TOTAL, T3FREE, THYROIDAB in the last 72 hours.  Invalid input(s): FREET3   Coagulation profile No results for input(s): INR, PROTIME in the last 168 hours. ------------------------------------------------------------------------------------------------------------------- No results for input(s): DDIMER in the last 72 hours. -------------------------------------------------------------------------------------------------------------------  Cardiac Enzymes  Recent Labs Lab 11/19/15 2014  TROPONINI 0.12*    ------------------------------------------------------------------------------------------------------------------ Invalid input(s): POCBNP  ---------------------------------------------------------------------------------------------------------------  Urinalysis    Component Value Date/Time   COLORURINE AMBER* 11/20/2015 0058   COLORURINE Yellow 11/19/2013 0822   APPEARANCEUR CLOUDY* 11/20/2015 0058   APPEARANCEUR Hazy 11/19/2013 0822   LABSPEC 1.014 11/20/2015 0058   LABSPEC 1.012 11/19/2013 0822   PHURINE 5.0 11/20/2015 0058   PHURINE 6.0 11/19/2013 0822   GLUCOSEU NEGATIVE 11/20/2015 0058   GLUCOSEU Negative 11/19/2013 0822   HGBUR 2+* 11/20/2015 0058   HGBUR Negative 11/19/2013 0822   BILIRUBINUR 1+* 11/20/2015 0058   BILIRUBINUR Negative 11/19/2013 0822   KETONESUR NEGATIVE 11/20/2015 0058   KETONESUR Negative 11/19/2013 0822   PROTEINUR 100* 11/20/2015 0058   PROTEINUR 100 mg/dL 11/19/2013 0822   NITRITE NEGATIVE 11/20/2015 0058   NITRITE Negative 11/19/2013 0822   LEUKOCYTESUR NEGATIVE 11/20/2015 0058   LEUKOCYTESUR 2+ 11/19/2013 0822     RADIOLOGY: Ct Abdomen Pelvis Wo Contrast  11/20/2015  CLINICAL DATA:  Currently receiving treatments for new diagnosis of pancreatic cancer. Worsening weakness today. Hypotensive and tachypnea. EXAM: CT ABDOMEN AND PELVIS WITHOUT CONTRAST TECHNIQUE: Multidetector CT imaging of the abdomen and pelvis was performed following the standard protocol without IV contrast. COMPARISON:  08/31/2015. FINDINGS: There is a biliary stent which appears satisfactorily positioned. There are unremarkable unenhanced appearances of the liver, spleen and adrenals. Moderate fullness in the pancreatic head is unchanged. Pancreatic body and tail are unremarkable. Multiple focal renal lesions are present without significant interval change. These may represent benign cysts but they are not characterized in the absence of intravenous contrast. Collecting  systems, ureters and urinary bladder are unremarkable. Multiple small bowel diverticula are present, not significantly changed. Colonic diverticulosis is present, not significantly changed. No evidence of bowel obstruction. No extraluminal air. No acute inflammatory changes are evident in the abdomen or pelvis. There is no ascites. The abdominal aorta is normal in caliber with mild atherosclerotic calcification. Mild patchy and linear opacities are present in both lung bases, likely atelectatic. This is new from 08/31/2015. IMPRESSION: 1. No acute findings are evident in the abdomen or pelvis. 2. Satisfactorily positioned biliary stent. 3. Mild atelectatic opacities in both lung bases. Electronically Signed   By: Andreas Newport M.D.   On: 11/20/2015 00:35   Dg Chest Portable 1 View  11/19/2015  CLINICAL DATA:  Patient was recently dx with Patient was so weak today she was unable to get back into bed from bathroom, fell to floor. Patient was hypotensive and tachypnic upon arrival EXAM: PORTABLE CHEST 1 VIEW COMPARISON:  06/16/2005 FINDINGS: There is a right sided port-a-cath. There is no focal parenchymal opacity. There is no pleural effusion or pneumothorax. The heart and mediastinal contours are unremarkable. There is mild osteoarthritis of the left glenohumeral joint. IMPRESSION: No active disease. Electronically Signed   By: Kathreen Devoid   On: 11/19/2015 20:26    EKG: Orders placed or performed in visit on 01/13/13  . EKG 12-Lead    IMPRESSION AND PLAN:57  82 year-old  female patient with history of pancreatic cancer, uterine cancer, diabetes mellitus, hypertension, breast cancer presented to the emergency room with abdominal pain, nausea and vomiting. Patient has elevated lactate level and WBC count. Admitting diagnosis 1. Sepsis 2. Abnormal liver function tests and jaundice 3. Nausea and vomiting 4. Leukocytosis 5. Acute on chronic renal failure 6. Abnormal troponin which could be secondary  to demand ischemia and sepsis 7. Probable biliary stent occlusion Treatment plan : Admit patient to stepdown unit IV fluid hydration Add patient on IV vancomycin and IV Zosyn antibiotics Follow-up liver function tests Follow renal function and cycle troponin Gastroenterology consultation Follow-up cultures.  All the records are reviewed and case discussed with ED provider. Management plans discussed with the patient, family and they are in agreement.  CODE STATUS:FULL    Code Status Orders        Start     Ordered   11/20/15 0133  Full code   Continuous     11/20/15 0134    Code Status History    Date Active Date Inactive Code Status Order ID Comments User Context   08/31/2015  3:49 PM 09/06/2015  3:18 PM Full Code [id number]  Epifanio Lesches, MD Inpatient       TOTAL CRITICAL CARE TIME TAKING CARE OF THIS PATIENT: 39minutes.    Saundra Shelling M.D on 11/20/2015 at 1:37 AM  Between 7am to 6pm - Pager - [phone]  After 6pm go to [email or web address] - password [email or web address] [facility]  [facility]  Office  [phone]  CC: Primary care physician; [name], MD

## 2015-11-20 NOTE — Progress Notes (Signed)
Notified Dr. Darvin Neighbours of lactic acid 2.7 and troponin .16. No new orders.

## 2015-11-20 NOTE — Consult Note (Signed)
Patient with E.coli bacteremia Will start Merepenem for presumed ESBL and await sensitvities

## 2015-11-20 NOTE — Consult Note (Signed)
Please see full GI consult by Pamela Preston. Patient seen and examined chart reviewed. Patient with a known history of pancreatic adenocarcinoma with ERCP and biliary stent placement in January 2017. He was due to have that stent removed this past month however reschedule that appointment. She is presenting with possible early sepsis/cholangitis. Currently she is feeling very much better and denies any abdominal pain. I discussed the case with Dr. Candace Cruise and he is planning for ERCP tomorrow as clinically feasible. No plans for lab assessment of hepatitis A and B. Also awaiting cardiology consult in regards to chest pain and abnormal troponins. Continue current.

## 2015-11-20 NOTE — Progress Notes (Signed)
Report given to Emeterio Reeve RN on oncology

## 2015-11-20 NOTE — Progress Notes (Signed)
Pharmacy Antibiotic Note  Pamela Preston is a 82 y.o. female admitted on 11/19/2015 with sepsis.  Pharmacy has been consulted for vancomycin and Zosyn dosing.  Plan: kei 0.018 hr-1  T1/2 39 hours Vancomycin 1250 mg q 48 hours ordered with stacked dosing. Level before 4th dose. Goal trough 15-20.  Zosyn 3.375 grams q 12 hours ordered.  Height: 5\' 8"  (172.7 cm) Weight: 220 lb (99.791 kg) IBW/kg (Calculated) : 63.9  Temp (24hrs), Avg:98 F (36.7 C), Min:97.7 F (36.5 C), Max:98.3 F (36.8 C)   Recent Labs Lab 11/15/15 1158 11/19/15 2014 11/19/15 2252 11/20/15 0445  WBC 7.0 19.6*  --  34.9*  CREATININE 1.90* 3.51*  --  3.36*  LATICACIDVEN  --  10.5* 8.7*  --     Estimated Creatinine Clearance: 15.7 mL/min (by C-G formula based on Cr of 3.36).    Allergies  Allergen Reactions  . Colchicine   . Ketorolac   . Lactase Diarrhea  . Lipitor [Atorvastatin]     Antimicrobials this admission: vancomycin Zosyn >>    >>   Dose adjustments this admission:   Microbiology results: 4/4 BCx: pending 4/4 MRSA PCR: pending  4/4 UA: (-) 4/3 CXR: no acute disease  Thank you for allowing pharmacy to be a part of this patient's care.  Idalia Allbritton S 11/20/2015 6:51 AM

## 2015-11-21 ENCOUNTER — Encounter: Admission: RE | Payer: Self-pay | Source: Ambulatory Visit

## 2015-11-21 ENCOUNTER — Ambulatory Visit: Payer: Medicare Other

## 2015-11-21 ENCOUNTER — Other Ambulatory Visit: Payer: Self-pay | Admitting: Internal Medicine

## 2015-11-21 ENCOUNTER — Ambulatory Visit: Admission: RE | Admit: 2015-11-21 | Payer: Medicare Other | Source: Ambulatory Visit | Admitting: Gastroenterology

## 2015-11-21 DIAGNOSIS — L8990 Pressure ulcer of unspecified site, unspecified stage: Secondary | ICD-10-CM | POA: Diagnosis present

## 2015-11-21 LAB — HEPATITIS B CORE ANTIBODY, TOTAL: Hep B Core Total Ab: NEGATIVE

## 2015-11-21 LAB — COMPREHENSIVE METABOLIC PANEL
ALBUMIN: 1.9 g/dL — AB (ref 3.5–5.0)
ALT: 125 U/L — ABNORMAL HIGH (ref 14–54)
ANION GAP: 5 (ref 5–15)
AST: 149 U/L — ABNORMAL HIGH (ref 15–41)
Alkaline Phosphatase: 421 U/L — ABNORMAL HIGH (ref 38–126)
BUN: 36 mg/dL — ABNORMAL HIGH (ref 6–20)
CALCIUM: 7.1 mg/dL — AB (ref 8.9–10.3)
CHLORIDE: 118 mmol/L — AB (ref 101–111)
CO2: 19 mmol/L — AB (ref 22–32)
Creatinine, Ser: 2.78 mg/dL — ABNORMAL HIGH (ref 0.44–1.00)
GFR calc non Af Amer: 15 mL/min — ABNORMAL LOW (ref >60)
GFR, EST AFRICAN AMERICAN: 17 mL/min — AB (ref >60)
GLUCOSE: 118 mg/dL — AB (ref 65–99)
POTASSIUM: 3.2 mmol/L — AB (ref 3.5–5.1)
SODIUM: 142 mmol/L (ref 135–145)
Total Bilirubin: 2 mg/dL — ABNORMAL HIGH (ref 0.3–1.2)
Total Protein: 5.1 g/dL — ABNORMAL LOW (ref 6.5–8.1)

## 2015-11-21 LAB — CBC WITH DIFFERENTIAL/PLATELET
BASOS PCT: 1 %
Basophils Absolute: 0.3 10*3/uL — ABNORMAL HIGH (ref 0–0.1)
EOS ABS: 0.6 10*3/uL (ref 0–0.7)
EOS PCT: 2 %
HCT: 26.4 % — ABNORMAL LOW (ref 35.0–47.0)
Hemoglobin: 8.7 g/dL — ABNORMAL LOW (ref 12.0–16.0)
LYMPHS ABS: 0.6 10*3/uL — AB (ref 1.0–3.6)
Lymphocytes Relative: 2 %
MCH: 29 pg (ref 26.0–34.0)
MCHC: 33.1 g/dL (ref 32.0–36.0)
MCV: 87.7 fL (ref 80.0–100.0)
MONO ABS: 0.9 10*3/uL (ref 0.2–0.9)
Monocytes Relative: 3 %
NEUTROS PCT: 92 %
Neutro Abs: 28.9 10*3/uL — ABNORMAL HIGH (ref 1.4–6.5)
PLATELETS: 178 10*3/uL (ref 150–440)
RBC: 3.01 MIL/uL — ABNORMAL LOW (ref 3.80–5.20)
RDW: 17.1 % — AB (ref 11.5–14.5)
WBC: 31.3 10*3/uL — ABNORMAL HIGH (ref 3.6–11.0)

## 2015-11-21 LAB — BLOOD CULTURE ID PANEL (REFLEXED)
Acinetobacter baumannii: NOT DETECTED
CANDIDA GLABRATA: NOT DETECTED
CANDIDA KRUSEI: NOT DETECTED
CANDIDA PARAPSILOSIS: NOT DETECTED
CANDIDA TROPICALIS: NOT DETECTED
Candida albicans: NOT DETECTED
Carbapenem resistance: NOT DETECTED
ENTEROBACTER CLOACAE COMPLEX: NOT DETECTED
ENTEROBACTERIACEAE SPECIES: DETECTED — AB
ENTEROCOCCUS SPECIES: NOT DETECTED
ESCHERICHIA COLI: DETECTED — AB
Haemophilus influenzae: NOT DETECTED
KLEBSIELLA OXYTOCA: NOT DETECTED
Klebsiella pneumoniae: NOT DETECTED
Listeria monocytogenes: NOT DETECTED
Methicillin resistance: NOT DETECTED
Neisseria meningitidis: NOT DETECTED
PROTEUS SPECIES: NOT DETECTED
PSEUDOMONAS AERUGINOSA: NOT DETECTED
STREPTOCOCCUS PNEUMONIAE: NOT DETECTED
STREPTOCOCCUS PYOGENES: NOT DETECTED
Serratia marcescens: NOT DETECTED
Staphylococcus aureus (BCID): NOT DETECTED
Staphylococcus species: NOT DETECTED
Streptococcus agalactiae: NOT DETECTED
Streptococcus species: NOT DETECTED
Vancomycin resistance: NOT DETECTED

## 2015-11-21 LAB — HEPATITIS B SURFACE ANTIBODY, QUANTITATIVE

## 2015-11-21 LAB — PROTIME-INR
INR: 1.33
Prothrombin Time: 16.6 seconds — ABNORMAL HIGH (ref 11.4–15.0)

## 2015-11-21 LAB — GLUCOSE, CAPILLARY
GLUCOSE-CAPILLARY: 111 mg/dL — AB (ref 65–99)
GLUCOSE-CAPILLARY: 116 mg/dL — AB (ref 65–99)
GLUCOSE-CAPILLARY: 167 mg/dL — AB (ref 65–99)
Glucose-Capillary: 186 mg/dL — ABNORMAL HIGH (ref 65–99)

## 2015-11-21 LAB — HEPATITIS A ANTIBODY, IGM: HEP A IGM: NEGATIVE

## 2015-11-21 LAB — HEMOGLOBIN A1C: HEMOGLOBIN A1C: 6.2 % — AB (ref 4.0–6.0)

## 2015-11-21 LAB — HEPATITIS B CORE ANTIBODY, IGM: Hep B C IgM: NEGATIVE

## 2015-11-21 LAB — HEPATITIS A ANTIBODY, TOTAL: HEP A TOTAL AB: POSITIVE — AB

## 2015-11-21 LAB — HEPATITIS B SURFACE ANTIGEN: HEP B S AG: NEGATIVE

## 2015-11-21 SURGERY — ERCP, WITH INTERVENTION IF INDICATED
Anesthesia: General

## 2015-11-21 MED ORDER — POTASSIUM CHLORIDE IN NACL 20-0.45 MEQ/L-% IV SOLN
INTRAVENOUS | Status: DC
  Administered 2015-11-21 – 2015-11-23 (×5): via INTRAVENOUS
  Filled 2015-11-21 (×7): qty 1000

## 2015-11-21 NOTE — Progress Notes (Signed)
Flexiseal removed. No orders, no indication for placement.

## 2015-11-21 NOTE — Progress Notes (Signed)
PT Cancellation Note  Patient Details Name: Pamela Preston MRN: [id number] DOB: [birth year]   Cancelled Treatment:    Reason Eval/Treat Not Completed: Patient declined, no reason specified.  Pt declined PT.  Will re-attempt PT eval at a later date and time.    Mittie Bodo, SPT  Mittie Bodo 11/21/2015, 4:11 PM

## 2015-11-21 NOTE — Consult Note (Signed)
[place]  CARDIOLOGY CONSULT NOTE  Patient ID: Pamela Preston MRN: [id number] DOB/AGE: 1930/06/29 82 y.o.  Admit date: 11/19/2015 Referring Physician Dr. Darvin Neighbours Primary Physician   Primary Cardiologist   Reason for Consultation chest pain/abnormal troponin  HPI: Pt is a 82 yo female with history of dm, pancreatic cancer, uterine cancer, hypertension and gerd who was admitted with abdominal  discomfort with nausea. She was noted to have elevated bilirubin and lfts. Her lactate level was elevated. She denied any sob of chet pain. She is s/p ercp done earlier this year. EKG revealed nsr with no ischemia. Serum creatinine was 2.38 c/w acute on chronic ckd. Her serum troponin was 0..08-0.16. She has improved somewhat and is scheduled for ercp in am.   Review of Systems  Eyes: Negative.   Respiratory: Negative.   Cardiovascular: Negative.   Gastrointestinal: Positive for heartburn, nausea and abdominal pain.  Genitourinary: Negative.   Musculoskeletal: Negative.   Skin: Negative.   Neurological: Positive for weakness.  Endo/Heme/Allergies: Negative.   Psychiatric/Behavioral: Negative.     Past Medical History  Diagnosis Date  . Hypertension   . GERD (gastroesophageal reflux disease)   . Diabetes mellitus without complication ([place])   . Breast cancer ([place]) 2013    c Lumpectomy 2013  . Uterine cancer ([place]) 1960  . Pancreatic cancer ([place])   . Abnormal LFTs     Family History  Problem Relation Age of Onset  . Cancer Mother   . Heart disease Brother     Social History   Social History  . Marital Status: Widowed    Spouse Name: N/A  . Number of Children: N/A  . Years of Education: N/A   Occupational History  . disabled    Social History Main Topics  . Smoking status: Never Smoker   . Smokeless tobacco: Not on file  . Alcohol Use: No  . Drug Use: No  . Sexual Activity: Not on file   Other Topics Concern  . Not on file   Social  History Narrative    Past Surgical History  Procedure Laterality Date  . Abdominal hysterectomy    . Knee arthroscopy    . Ercp N/A 09/04/2015    Procedure: ENDOSCOPIC RETROGRADE CHOLANGIOPANCREATOGRAPHY (ERCP);  Surgeon: Hulen Luster, MD;  Location: [facility] ENDOSCOPY;  Service: Gastroenterology;  Laterality: N/A;  . Breast biopsy Right 2012    Positive  . Upper esophageal endoscopic ultrasound (eus) N/A 10/04/2015    Procedure: UPPER ESOPHAGEAL ENDOSCOPIC ULTRASOUND (EUS);  Surgeon: Holly Bodily, MD;  Location: [facility] ENDOSCOPY;  Service: Gastroenterology;  Laterality: N/A;  . Peripheral vascular catheterization N/A 11/05/2015    Procedure: Porta Cath Insertion;  Surgeon: Algernon Huxley, MD;  Location: [place] CV LAB;  Service: Cardiovascular;  Laterality: N/A;     Prescriptions prior to admission  Medication Sig Dispense Refill Last Dose  . amLODipine (NORVASC) 10 MG tablet Take 10 mg by mouth daily.   11/19/2015 at Unknown time  . aspirin 81 MG tablet Take 81 mg by mouth daily.   11/19/2015 at 0800  . buPROPion (WELLBUTRIN SR) 150 MG 12 hr tablet Take 150 mg by mouth 2 (two) times daily.     . febuxostat (ULORIC) 40 MG tablet Take 80 mg by mouth daily.   11/19/2015 at Unknown time  . furosemide (LASIX) 20 MG tablet Take 20 mg by mouth daily.   11/19/2015 at Unknown time  . glipiZIDE (GLUCOTROL) 5 MG  tablet Take 5 mg by mouth daily before breakfast.    11/19/2015 at Unknown time  . HYDROcodone-acetaminophen (NORCO/VICODIN) 5-325 MG tablet Take 1 tablet by mouth every 6 (six) hours as needed for moderate pain.   11/19/2015 at Unknown time  . letrozole (FEMARA) 2.5 MG tablet Take 2.5 mg by mouth daily.   11/19/2015 at Unknown time  . levothyroxine (SYNTHROID, LEVOTHROID) 75 MCG tablet Take 75 mcg by mouth daily before breakfast.   11/19/2015 at Unknown time  . lisinopril (PRINIVIL,ZESTRIL) 40 MG tablet Take 40 mg by mouth daily.   11/19/2015 at Unknown time  . loperamide (IMODIUM) 2 MG capsule TK 1 C PO Q 6  H PRN FOR DIARRHEA OR LOOSE STOOLS  0 11/19/2015 at Unknown time  . metoprolol succinate (TOPROL-XL) 100 MG 24 hr tablet Take 100 mg by mouth daily. Take with or immediately following a meal.   11/19/2015 at Unknown time  . omeprazole (PRILOSEC) 20 MG capsule Take 20 mg by mouth daily.   11/19/2015 at Unknown time  . potassium chloride 20 MEQ TBCR Take 20 mEq by mouth 2 (two) times daily. 30 tablet 0 11/19/2015 at Unknown time  . pravastatin (PRAVACHOL) 40 MG tablet Take 40 mg by mouth daily.   11/19/2015 at Unknown time  . insulin glargine (LANTUS) 100 UNIT/ML injection Inject 25 Units into the skin 2 (two) times daily.    Taking    Physical Exam: Blood pressure 137/43, pulse 72, temperature 98 F (36.7 C), temperature source Oral, resp. rate 20, height 5\' 8"  (1.727 m), weight 116.665 kg (257 lb 3.2 oz), SpO2 97 %.   Wt Readings from Last 1 Encounters:  11/20/15 116.665 kg (257 lb 3.2 oz)     General appearance: alert and cooperative Head: Normocephalic, without obvious abnormality, atraumatic Resp: clear to auscultation bilaterally Cardio: regular rate and rhythm and systolic murmur: late systolic 2/6, musical at lower left sternal border GI: soft, non-tender; bowel sounds normal; no masses,  no organomegaly Extremities: extremities normal, atraumatic, no cyanosis or edema Neurologic: Grossly normal  Labs:   Lab Results  Component Value Date   WBC 31.3* 11/21/2015   HGB 8.7* 11/21/2015   HCT 26.4* 11/21/2015   MCV 87.7 11/21/2015   PLT 178 11/21/2015    Recent Labs Lab 11/21/15 0605  NA 142  K 3.2*  CL 118*  CO2 19*  BUN 36*  CREATININE 2.78*  CALCIUM 7.1*  PROT 5.1*  BILITOT 2.0*  ALKPHOS 421*  ALT 125*  AST 149*  GLUCOSE 118*   Lab Results  Component Value Date   TROPONINI 0.11* 11/20/2015      Radiology: no evidence of active disease EKG: nsr with no ischemia  ASSESSMENT AND PLAN:  Abdominal/epigasstric pain-does not appear to be ischemic. EKG is unremarkable.  Elevated troponin appear to be secondary to demand plus renal insuffiency. No evidence of nstemi. No further cardaic workup indicatd  ckd-acute on chronic ckd. Gentle hydration.  Pancreatic pain-ercp pending.  Signed: Teodoro Spray MD, [facility] 11/21/2015, 3:45 PM

## 2015-11-21 NOTE — Consult Note (Signed)
Subjective: Patient seen for cholangitis/biliary sepsis. Patient is feeling better today. She denies any nausea or abdominal pain. She has had some loose stool. She has been afebrile.  Objective: Vital signs in last 24 hours: Temp:  [97.7 F (36.5 C)-98.6 F (37 C)] 98.5 F (36.9 C) (04/05 0800) Pulse Rate:  [73-84] 81 (04/05 0800) Resp:  [16-23] 18 (04/05 0450) BP: (106-130)/(45-99) 130/45 mmHg (04/05 0800) SpO2:  [97 %-99 %] 98 % (04/05 0800) Weight:  [116.665 kg (257 lb 3.2 oz)] 116.665 kg (257 lb 3.2 oz) (04/04 2345) Blood pressure 130/45, pulse 81, temperature 98.5 F (36.9 C), temperature source Oral, resp. rate 18, height 5\' 8"  (1.727 m), weight 116.665 kg (257 lb 3.2 oz), SpO2 98 %.   Intake/Output from previous day: 04/04 0701 - 04/05 0700 In: 1645 [P.O.:360; I.V.:1235; IV Piggyback:50] Out: 1975 [Urine:1825; Stool:150]  Intake/Output this shift: Total I/O In: -  Out: 285 [Urine:285]   General appearance:  82 year old female no distress Resp:  Bilaterally clear to auscultation Cardio:  Regular rate and rhythm without rub or gallop GI:  Soft nontender nondistended bowel sounds positive normoactive Extremities:  No clubbing cyanosis or edema   Lab Results: Results for orders placed or performed during the hospital encounter of 11/19/15 (from the past 24 hour(s))  Glucose, capillary     Status: Abnormal   Collection Time: 11/20/15  1:45 PM  Result Value Ref Range   Glucose-Capillary 199 (H) 65 - 99 mg/dL  Troponin I     Status: Abnormal   Collection Time: 11/20/15  2:39 PM  Result Value Ref Range   Troponin I 0.11 (H) <0.031 ng/mL  Hemoglobin A1c     Status: Abnormal   Collection Time: 11/20/15  2:39 PM  Result Value Ref Range   Hgb A1c MFr Bld 6.2 (H) 4.0 - 6.0 %  Bilirubin, direct     Status: Abnormal   Collection Time: 11/20/15  2:39 PM  Result Value Ref Range   Bilirubin, Direct 2.5 (H) 0.1 - 0.5 mg/dL  Hepatitis B surface antigen     Status: None    Collection Time: 11/20/15  2:39 PM  Result Value Ref Range   Hepatitis B Surface Ag Negative Negative  Hepatitis B core antibody, total     Status: None   Collection Time: 11/20/15  2:39 PM  Result Value Ref Range   Hep B Core Total Ab Negative Negative  Hepatitis B surface antibody     Status: Abnormal   Collection Time: 11/20/15  2:39 PM  Result Value Ref Range   Hepatitis B-Post <3.1 (L) Immunity>9.9 mIU/mL  Hepatitis A antibody, total     Status: Abnormal   Collection Time: 11/20/15  2:39 PM  Result Value Ref Range   Hep A Total Ab Positive (A) Negative  Hepatitis A antibody, IgM     Status: None   Collection Time: 11/20/15  2:39 PM  Result Value Ref Range   Hep A IgM Negative Negative  Hepatitis B core antibody, IgM     Status: None   Collection Time: 11/20/15  2:39 PM  Result Value Ref Range   Hep B C IgM Negative Negative  Glucose, capillary     Status: Abnormal   Collection Time: 11/20/15  5:19 PM  Result Value Ref Range   Glucose-Capillary 178 (H) 65 - 99 mg/dL  Gastrointestinal Panel by PCR , Stool     Status: None   Collection Time: 11/20/15  6:39 PM  Result Value Ref  Range   Campylobacter species NOT DETECTED NOT DETECTED   Plesimonas shigelloides NOT DETECTED NOT DETECTED   Salmonella species NOT DETECTED NOT DETECTED   Yersinia enterocolitica NOT DETECTED NOT DETECTED   Vibrio species NOT DETECTED NOT DETECTED   Vibrio cholerae NOT DETECTED NOT DETECTED   Enteroaggregative E coli (EAEC) NOT DETECTED NOT DETECTED   Enteropathogenic E coli (EPEC) NOT DETECTED NOT DETECTED   Enterotoxigenic E coli (ETEC) NOT DETECTED NOT DETECTED   Shiga like toxin producing E coli (STEC) NOT DETECTED NOT DETECTED   E. coli O157 NOT DETECTED NOT DETECTED   Shigella/Enteroinvasive E coli (EIEC) NOT DETECTED NOT DETECTED   Cryptosporidium NOT DETECTED NOT DETECTED   Cyclospora cayetanensis NOT DETECTED NOT DETECTED   Entamoeba histolytica NOT DETECTED NOT DETECTED   Giardia  lamblia NOT DETECTED NOT DETECTED   Adenovirus F40/41 NOT DETECTED NOT DETECTED   Astrovirus NOT DETECTED NOT DETECTED   Norovirus GI/GII NOT DETECTED NOT DETECTED   Rotavirus A NOT DETECTED NOT DETECTED   Sapovirus (I, II, IV, and V) NOT DETECTED NOT DETECTED  Glucose, capillary     Status: Abnormal   Collection Time: 11/20/15  9:44 PM  Result Value Ref Range   Glucose-Capillary 154 (H) 65 - 99 mg/dL  CBC with Differential/Platelet     Status: Abnormal   Collection Time: 11/21/15  6:05 AM  Result Value Ref Range   WBC 31.3 (H) 3.6 - 11.0 K/uL   RBC 3.01 (L) 3.80 - 5.20 MIL/uL   Hemoglobin 8.7 (L) 12.0 - 16.0 g/dL   HCT 26.4 (L) 35.0 - 47.0 %   MCV 87.7 80.0 - 100.0 fL   MCH 29.0 26.0 - 34.0 pg   MCHC 33.1 32.0 - 36.0 g/dL   RDW 17.1 (H) 11.5 - 14.5 %   Platelets 178 150 - 440 K/uL   Neutrophils Relative % 92 %   Lymphocytes Relative 2 %   Monocytes Relative 3 %   Eosinophils Relative 2 %   Basophils Relative 1 %   Neutro Abs 28.9 (H) 1.4 - 6.5 K/uL   Lymphs Abs 0.6 (L) 1.0 - 3.6 K/uL   Monocytes Absolute 0.9 0.2 - 0.9 K/uL   Eosinophils Absolute 0.6 0 - 0.7 K/uL   Basophils Absolute 0.3 (H) 0 - 0.1 K/uL  Comprehensive metabolic panel     Status: Abnormal   Collection Time: 11/21/15  6:05 AM  Result Value Ref Range   Sodium 142 135 - 145 mmol/L   Potassium 3.2 (L) 3.5 - 5.1 mmol/L   Chloride 118 (H) 101 - 111 mmol/L   CO2 19 (L) 22 - 32 mmol/L   Glucose, Bld 118 (H) 65 - 99 mg/dL   BUN 36 (H) 6 - 20 mg/dL   Creatinine, Ser 2.78 (H) 0.44 - 1.00 mg/dL   Calcium 7.1 (L) 8.9 - 10.3 mg/dL   Total Protein 5.1 (L) 6.5 - 8.1 g/dL   Albumin 1.9 (L) 3.5 - 5.0 g/dL   AST 149 (H) 15 - 41 U/L   ALT 125 (H) 14 - 54 U/L   Alkaline Phosphatase 421 (H) 38 - 126 U/L   Total Bilirubin 2.0 (H) 0.3 - 1.2 mg/dL   GFR calc non Af Amer 15 (L) >60 mL/min   GFR calc Af Amer 17 (L) >60 mL/min   Anion gap 5 5 - 15  Protime-INR     Status: Abnormal   Collection Time: 11/21/15  6:05 AM   Result Value Ref Range  Prothrombin Time 16.6 (H) 11.4 - 15.0 seconds   INR 1.33   Glucose, capillary     Status: Abnormal   Collection Time: 11/21/15  7:48 AM  Result Value Ref Range   Glucose-Capillary 111 (H) 65 - 99 mg/dL  Glucose, capillary     Status: Abnormal   Collection Time: 11/21/15 11:21 AM  Result Value Ref Range   Glucose-Capillary 116 (H) 65 - 99 mg/dL      Recent Labs  11/20/15 0445 11/20/15 0821 11/21/15 0605  WBC 34.9* 35.2* 31.3*  HGB 8.9* 10.1* 8.7*  HCT 27.1* 30.5* 26.4*  PLT 188 201 178   BMET  Recent Labs  11/20/15 0445 11/20/15 0821 11/21/15 0605  NA 138 139 142  K 4.0 3.7 3.2*  CL 111 109 118*  CO2 18* 20* 19*  GLUCOSE 234* 250* 118*  BUN 39* 40* 36*  CREATININE 3.36* 3.24* 2.78*  CALCIUM 7.4* 7.6* 7.1*   LFT  Recent Labs  11/20/15 1439 11/21/15 0605  PROT  --  5.1*  ALBUMIN  --  1.9*  AST  --  149*  ALT  --  125*  ALKPHOS  --  421*  BILITOT  --  2.0*  BILIDIR 2.5*  --    PT/INR  Recent Labs  11/20/15 0821 11/21/15 0605  LABPROT 17.5* 16.6*  INR 1.43 1.33   Hepatitis Panel  Recent Labs  11/20/15 1439  HEPBSAG Negative  HEPAIGM Negative  HEPBIGM Negative   C-Diff  Recent Labs  11/20/15 0634  CDIFFTOX NEGATIVE   No results for input(s): CDIFFPCR in the last 72 hours.   Studies/Results: Ct Abdomen Pelvis Wo Contrast  11/20/2015  CLINICAL DATA:  Currently receiving treatments for new diagnosis of pancreatic cancer. Worsening weakness today. Hypotensive and tachypnea. EXAM: CT ABDOMEN AND PELVIS WITHOUT CONTRAST TECHNIQUE: Multidetector CT imaging of the abdomen and pelvis was performed following the standard protocol without IV contrast. COMPARISON:  08/31/2015. FINDINGS: There is a biliary stent which appears satisfactorily positioned. There are unremarkable unenhanced appearances of the liver, spleen and adrenals. Moderate fullness in the pancreatic head is unchanged. Pancreatic body and tail are  unremarkable. Multiple focal renal lesions are present without significant interval change. These may represent benign cysts but they are not characterized in the absence of intravenous contrast. Collecting systems, ureters and urinary bladder are unremarkable. Multiple small bowel diverticula are present, not significantly changed. Colonic diverticulosis is present, not significantly changed. No evidence of bowel obstruction. No extraluminal air. No acute inflammatory changes are evident in the abdomen or pelvis. There is no ascites. The abdominal aorta is normal in caliber with mild atherosclerotic calcification. Mild patchy and linear opacities are present in both lung bases, likely atelectatic. This is new from 08/31/2015. IMPRESSION: 1. No acute findings are evident in the abdomen or pelvis. 2. Satisfactorily positioned biliary stent. 3. Mild atelectatic opacities in both lung bases. Electronically Signed   By: Andreas Newport M.D.   On: 11/20/2015 00:35   Dg Chest Portable 1 View  11/19/2015  CLINICAL DATA:  Patient was recently dx with Patient was so weak today she was unable to get back into bed from bathroom, fell to floor. Patient was hypotensive and tachypnic upon arrival EXAM: PORTABLE CHEST 1 VIEW COMPARISON:  06/16/2005 FINDINGS: There is a right sided port-a-cath. There is no focal parenchymal opacity. There is no pleural effusion or pneumothorax. The heart and mediastinal contours are unremarkable. There is mild osteoarthritis of the left glenohumeral joint. IMPRESSION: No active  disease. Electronically Signed   By: Kathreen Devoid   On: 11/19/2015 20:26    Scheduled Inpatient Medications:   . amLODipine  10 mg Oral Daily  . aspirin  81 mg Oral Daily  . febuxostat  80 mg Oral Daily  . heparin  5,000 Units Subcutaneous 3 times per day  . insulin aspart  0-5 Units Subcutaneous QHS  . insulin aspart  0-9 Units Subcutaneous TID WC  . insulin detemir  12 Units Subcutaneous Daily  .  levothyroxine  75 mcg Oral QAC breakfast  . meropenem (MERREM) IV  500 mg Intravenous Q12H  . metoprolol succinate  100 mg Oral Daily  . pantoprazole  40 mg Oral Daily  . sodium chloride flush  3 mL Intravenous Q12H    Continuous Inpatient Infusions:   . 0.45 % NaCl with KCl 20 mEq / L 75 mL/hr at 11/21/15 1053    PRN Inpatient Medications:  acetaminophen, morphine injection, ondansetron **OR** ondansetron (ZOFRAN) IV, oxyCODONE, sodium chloride flush  Miscellaneous:   Assessment:  1. Cholangitis/biliary sepsis secondary to stent occlusion. Patient much improved today with no fever although white count is still elevated at is decreased some. She has no abdominal pain.  Plan:  1. Case was discussed with Dr. Candace Cruise. We will plan for ERCP tomorrow afternoon unless there is a clinical change.  Lollie Sails MD 11/21/2015, 1:05 PM

## 2015-11-21 NOTE — Progress Notes (Signed)
Dr Darvin Neighbours made aware that pt with BP of 130/45 pulse 81, per MD give metoprolol and hold norvasc, pt NPO for procedure ok to give levemir per MD

## 2015-11-21 NOTE — Progress Notes (Addendum)
[facility] [place] NAME: Pamela Preston    MR#:  [id number]  DATE OF BIRTH:  April 08, 1930  SUBJECTIVE:  CHIEF COMPLAINT:   Chief Complaint  Patient presents with  . Weakness   Admitted for weakness and abdominal pain. Found to have fever with elevated liver enzymes and WBC.  Abdominal pain is improved NPO for ERCP Afebrile  REVIEW OF SYSTEMS:    Review of Systems  Constitutional: Positive for fever, chills and malaise/fatigue.  HENT: Negative for sore throat.   Eyes: Negative for blurred vision, double vision and pain.  Respiratory: Negative for cough, hemoptysis, shortness of breath and wheezing.   Cardiovascular: Negative for chest pain, palpitations, orthopnea and leg swelling.  Gastrointestinal: Positive for nausea, abdominal pain and diarrhea. Negative for heartburn, vomiting and constipation.  Genitourinary: Negative for dysuria and hematuria.  Musculoskeletal: Negative for back pain and joint pain.  Skin: Negative for rash.  Neurological: Positive for weakness. Negative for sensory change, speech change, focal weakness and headaches.  Endo/Heme/Allergies: Does not bruise/bleed easily.  Psychiatric/Behavioral: Negative for depression. The patient is not nervous/anxious.     DRUG ALLERGIES:   Allergies  Allergen Reactions  . Colchicine   . Ketorolac   . Lactase Diarrhea  . Lipitor [Atorvastatin]     VITALS:  Blood pressure 130/45, pulse 81, temperature 98.5 F (36.9 C), temperature source Oral, resp. rate 18, height 5\' 8"  (1.727 m), weight 116.665 kg (257 lb 3.2 oz), SpO2 98 %.  PHYSICAL EXAMINATION:   Physical Exam  GENERAL:  82 y.o.-year-old patient lying in the bed with no acute distress.  EYES: Pupils equal, round, reactive to light and accommodation.  Extraocular muscles intact. Icterus positive HEENT: Head atraumatic, normocephalic. Oropharynx and nasopharynx clear.  NECK:  Supple, no jugular venous  distention. No thyroid enlargement, no tenderness.  LUNGS: Normal breath sounds bilaterally, no wheezing, rales, rhonchi. No use of accessory muscles of respiration.  CARDIOVASCULAR: S1, S2 normal. No murmurs, rubs, or gallops.  ABDOMEN: Soft, Tenderness upper abdomen. Nondistended. Bowel sounds present EXTREMITIES: No cyanosis, clubbing or edema b/l.    NEUROLOGIC: Cranial nerves II through XII are intact. No focal Motor or sensory deficits b/l.   PSYCHIATRIC: The patient is alert and oriented x 3.  SKIN: No obvious rash, lesion, or ulcer.   LABORATORY PANEL:   CBC  Recent Labs Lab 11/21/15 0605  WBC 31.3*  HGB 8.7*  HCT 26.4*  PLT 178   ------------------------------------------------------------------------------------------------------------------ Chemistries   Recent Labs Lab 11/15/15 1158  11/21/15 0605  NA 137  < > 142  K 3.2*  < > 3.2*  CL 104  < > 118*  CO2 25  < > 19*  GLUCOSE 88  < > 118*  BUN 23*  < > 36*  CREATININE 1.90*  < > 2.78*  CALCIUM 8.6*  < > 7.1*  MG 1.7  --   --   AST 139*  < > 149*  ALT 138*  < > 125*  ALKPHOS 634*  < > 421*  BILITOT 1.6*  < > 2.0*  < > = values in this interval not displayed. ------------------------------------------------------------------------------------------------------------------  Cardiac Enzymes  Recent Labs Lab 11/20/15 1439  TROPONINI 0.11*   ------------------------------------------------------------------------------------------------------------------  RADIOLOGY:  Ct Abdomen Pelvis Wo Contrast  11/20/2015  CLINICAL DATA:  Currently receiving treatments for new diagnosis of pancreatic cancer. Worsening weakness today. Hypotensive and tachypnea. EXAM: CT ABDOMEN AND PELVIS WITHOUT CONTRAST TECHNIQUE: Multidetector CT imaging of the  abdomen and pelvis was performed following the standard protocol without IV contrast. COMPARISON:  08/31/2015. FINDINGS: There is a biliary stent which appears satisfactorily  positioned. There are unremarkable unenhanced appearances of the liver, spleen and adrenals. Moderate fullness in the pancreatic head is unchanged. Pancreatic body and tail are unremarkable. Multiple focal renal lesions are present without significant interval change. These may represent benign cysts but they are not characterized in the absence of intravenous contrast. Collecting systems, ureters and urinary bladder are unremarkable. Multiple small bowel diverticula are present, not significantly changed. Colonic diverticulosis is present, not significantly changed. No evidence of bowel obstruction. No extraluminal air. No acute inflammatory changes are evident in the abdomen or pelvis. There is no ascites. The abdominal aorta is normal in caliber with mild atherosclerotic calcification. Mild patchy and linear opacities are present in both lung bases, likely atelectatic. This is new from 08/31/2015. IMPRESSION: 1. No acute findings are evident in the abdomen or pelvis. 2. Satisfactorily positioned biliary stent. 3. Mild atelectatic opacities in both lung bases. Electronically Signed   By: Andreas Newport M.D.   On: 11/20/2015 00:35   Dg Chest Portable 1 View  11/19/2015  CLINICAL DATA:  Patient was recently dx with Patient was so weak today she was unable to get back into bed from bathroom, fell to floor. Patient was hypotensive and tachypnic upon arrival EXAM: PORTABLE CHEST 1 VIEW COMPARISON:  06/16/2005 FINDINGS: There is a right sided port-a-cath. There is no focal parenchymal opacity. There is no pleural effusion or pneumothorax. The heart and mediastinal contours are unremarkable. There is mild osteoarthritis of the left glenohumeral joint. IMPRESSION: No active disease. Electronically Signed   By: Kathreen Devoid   On: 11/19/2015 20:26     ASSESSMENT AND PLAN:   * Severe Sepsis due to acute cholangitis causing E coli bacteremia On IV meropenem Sensitivities pending ERCP later today She does have a  biliary stent in place.   * Abnormal liver function tests and jaundice Due to above  * Acute on chronic renal failure due to ATN from sepsis Improving with IV fluids. Continue fluids Monitor input and output  baseline creatinine 2  * Abnormal troponin which could be secondary to demand ischemia and sepsis repeat troponin stable.   * Pancreatic cancer Recently getting chemotherapy. Outpatient follow-up with oncology  * Anemia of chronic disease  * Insulin-dependent diabetes mellitus Started Lantus at 12 units daily. she is on 25 units 2 times a day at home  Sliding scale insulin  * DVT prophylaxis with heparin  All the records are reviewed and case discussed with Care Management/Social Workerr. Management plans discussed with the patient, family and they are in agreement.  CODE STATUS: FULL  DVT Prophylaxis: SCDs  TOTAL TIME TAKING CARE OF THIS PATIENT: 35 minutes.   POSSIBLE D/C IN 2-3 DAYS, DEPENDING ON CLINICAL CONDITION.  Hillary Bow R M.D on 11/21/2015 at 10:09 AM  Between 7am to 6pm - Pager - [phone]  After 6pm go to [email or web address] - password [email or web address] [facility]  Office  [phone]  CC: Primary care physician; [name], MD  Note: This dictation was prepared with Dragon dictation along with smaller phrase technology. Any transcriptional errors that result from this process are unintentional.

## 2015-11-21 NOTE — Plan of Care (Signed)
Problem: Physical Regulation: Goal: Will remain free from infection Outcome: Progressing Pt with no complaints, no distress or discomfort noted, foley discontinued per order, pt voiding without difficulty, ERCP tomorrow, pt voices any needs

## 2015-11-21 NOTE — Care Management (Signed)
Admitted to [facility] with the diagnosis of sepsis. Lives alone. Sister is Estelle Grumbles [phone] or [phone]). Sees Dr. Burna Sis as primary care physician. Sees Dr. Jeb Levering as oncologist.  [facility] for rehabilitation in the past. [place] [facility] for [facility] for services in the past. No home oxygen. Walker, rollator, cane, and lift chair in the home.  Decreased appetite. Fallen x 2.  Chemotherapy and radiation in progress. Spoke with sister, Ms. Edwards. Discussed [facility] services in the home. States she is in agreement with this plan Shelbie Ammons RN MSN CCM Care Management   [phone]

## 2015-11-22 ENCOUNTER — Inpatient Hospital Stay: Payer: Medicare Other | Admitting: Internal Medicine

## 2015-11-22 ENCOUNTER — Inpatient Hospital Stay: Payer: Medicare Other

## 2015-11-22 ENCOUNTER — Encounter
Admission: EM | Disposition: A | Discharge status: Skilled Nursing Facility | Payer: Self-pay | Source: Home / Self Care | Attending: Internal Medicine

## 2015-11-22 ENCOUNTER — Inpatient Hospital Stay: Payer: Medicare Other | Admitting: Certified Registered Nurse Anesthetist

## 2015-11-22 ENCOUNTER — Ambulatory Visit: Payer: Medicare Other

## 2015-11-22 HISTORY — PX: ERCP: SHX5425

## 2015-11-22 LAB — COMPREHENSIVE METABOLIC PANEL
ALBUMIN: 1.9 g/dL — AB (ref 3.5–5.0)
ALT: 124 U/L — ABNORMAL HIGH (ref 14–54)
AST: 167 U/L — AB (ref 15–41)
Alkaline Phosphatase: 511 U/L — ABNORMAL HIGH (ref 38–126)
Anion gap: 4 — ABNORMAL LOW (ref 5–15)
BUN: 29 mg/dL — AB (ref 6–20)
CHLORIDE: 116 mmol/L — AB (ref 101–111)
CO2: 20 mmol/L — ABNORMAL LOW (ref 22–32)
Calcium: 7.5 mg/dL — ABNORMAL LOW (ref 8.9–10.3)
Creatinine, Ser: 2.17 mg/dL — ABNORMAL HIGH (ref 0.44–1.00)
GFR calc Af Amer: 23 mL/min — ABNORMAL LOW (ref >60)
GFR calc non Af Amer: 20 mL/min — ABNORMAL LOW (ref >60)
GLUCOSE: 112 mg/dL — AB (ref 65–99)
POTASSIUM: 3.4 mmol/L — AB (ref 3.5–5.1)
Sodium: 140 mmol/L (ref 135–145)
Total Bilirubin: 1.7 mg/dL — ABNORMAL HIGH (ref 0.3–1.2)
Total Protein: 5.4 g/dL — ABNORMAL LOW (ref 6.5–8.1)

## 2015-11-22 LAB — CBC WITH DIFFERENTIAL/PLATELET
BASOS ABS: 0 10*3/uL (ref 0–0.1)
BASOS PCT: 0 %
Band Neutrophils: 2 %
Blasts: 0 %
EOS PCT: 4 %
Eosinophils Absolute: 0.8 10*3/uL — ABNORMAL HIGH (ref 0–0.7)
HCT: 27.3 % — ABNORMAL LOW (ref 35.0–47.0)
Hemoglobin: 9.1 g/dL — ABNORMAL LOW (ref 12.0–16.0)
LYMPHS ABS: 0.4 10*3/uL — AB (ref 1.0–3.6)
Lymphocytes Relative: 2 %
MCH: 29 pg (ref 26.0–34.0)
MCHC: 33.4 g/dL (ref 32.0–36.0)
MCV: 86.9 fL (ref 80.0–100.0)
METAMYELOCYTES PCT: 1 %
MONO ABS: 1.5 10*3/uL — AB (ref 0.2–0.9)
MYELOCYTES: 0 %
Monocytes Relative: 7 %
Neutro Abs: 18.1 10*3/uL — ABNORMAL HIGH (ref 1.4–6.5)
Neutrophils Relative %: 84 %
Other: 0 %
PLATELETS: 191 10*3/uL (ref 150–440)
Promyelocytes Absolute: 0 %
RBC: 3.14 MIL/uL — ABNORMAL LOW (ref 3.80–5.20)
RDW: 17 % — AB (ref 11.5–14.5)
WBC: 20.8 10*3/uL — AB (ref 3.6–11.0)
nRBC: 0 /100 WBC

## 2015-11-22 LAB — CULTURE, BLOOD (SINGLE)

## 2015-11-22 LAB — GLUCOSE, CAPILLARY
GLUCOSE-CAPILLARY: 121 mg/dL — AB (ref 65–99)
GLUCOSE-CAPILLARY: 152 mg/dL — AB (ref 65–99)
Glucose-Capillary: 135 mg/dL — ABNORMAL HIGH (ref 65–99)
Glucose-Capillary: 136 mg/dL — ABNORMAL HIGH (ref 65–99)
Glucose-Capillary: 78 mg/dL (ref 65–99)

## 2015-11-22 SURGERY — ERCP, WITH INTERVENTION IF INDICATED
Anesthesia: General

## 2015-11-22 MED ORDER — SODIUM CHLORIDE 0.9 % IV SOLN: INTRAVENOUS | Status: DC

## 2015-11-22 MED ORDER — PROPOFOL 10 MG/ML IV BOLUS
INTRAVENOUS | Status: DC | PRN
  Administered 2015-11-22: 150 mg via INTRAVENOUS

## 2015-11-22 MED ORDER — DEXTROSE 5 % IV SOLN
1.0000 g | INTRAVENOUS | Status: DC
  Administered 2015-11-22 – 2015-11-24 (×3): 1 g via INTRAVENOUS
  Filled 2015-11-22 (×3): qty 10

## 2015-11-22 MED ORDER — FENTANYL CITRATE (PF) 100 MCG/2ML IJ SOLN
INTRAMUSCULAR | Status: DC | PRN
  Administered 2015-11-22: 50 ug via INTRAVENOUS

## 2015-11-22 MED ORDER — SUCCINYLCHOLINE CHLORIDE 20 MG/ML IJ SOLN
INTRAMUSCULAR | Status: DC | PRN
  Administered 2015-11-22: 100 mg via INTRAVENOUS

## 2015-11-22 MED ORDER — ONDANSETRON HCL 4 MG/2ML IJ SOLN
INTRAMUSCULAR | Status: DC | PRN
  Administered 2015-11-22: 4 mg via INTRAVENOUS

## 2015-11-22 MED ORDER — SUGAMMADEX SODIUM 200 MG/2ML IV SOLN
INTRAVENOUS | Status: DC | PRN
  Administered 2015-11-22: 250 mg via INTRAVENOUS

## 2015-11-22 MED ORDER — DEXTROSE 5 % IV SOLN
INTRAVENOUS | Status: DC
  Administered 2015-11-22: 1000 mL via INTRAVENOUS

## 2015-11-22 MED ORDER — LIDOCAINE HCL (CARDIAC) 20 MG/ML IV SOLN
INTRAVENOUS | Status: DC | PRN
Start: 2015-11-22 — End: 2015-11-22
  Administered 2015-11-22: 100 mg via INTRAVENOUS

## 2015-11-22 MED ORDER — ROCURONIUM BROMIDE 100 MG/10ML IV SOLN
INTRAVENOUS | Status: DC | PRN
  Administered 2015-11-22: 40 mg via INTRAVENOUS

## 2015-11-22 NOTE — OR Nursing (Signed)
BS 70 dr Andree Elk ordered d5w.and it was hung.

## 2015-11-22 NOTE — Op Note (Signed)
[facility] Gastroenterology Patient Name: Pamela Preston Procedure Date: 11/22/2015 4:36 PM MRN: [id number] Account #: [id number] Date of Birth: [birth year] Admit Type: Inpatient Age: 82 Room: [facility] ENDO ROOM 4 Gender: Female Note Status: Finalized Procedure:            ERCP Indications:          Jaundice, pancreatic cancer, evidence of cholangitis Providers:            Lupita Dawn. Candace Cruise, MD Referring MD:         Precious Bard, MD (Referring MD) Medicines:            General Anesthesia Complications:        No immediate complications. Procedure:            Pre-Anesthesia Assessment:                       - Prior to the procedure, a History and Physical was                        performed, and patient medications, allergies and                        sensitivities were reviewed. The patient's tolerance of                        previous anesthesia was reviewed.                       - The risks and benefits of the procedure and the                        sedation options and risks were discussed with the                        patient. All questions were answered and informed                        consent was obtained.                       - After reviewing the risks and benefits, the patient                        was deemed in satisfactory condition to undergo the                        procedure.                       After obtaining informed consent, the scope was passed                        under direct vision. Throughout the procedure, the                        patient's blood pressure, pulse, and oxygen saturations                        were monitored continuously. The Endosonoscope was  introduced through the mouth, and used to inject                        contrast into and used to cannulate the bile duct. The                        ERCP was accomplished without difficulty. The patient                        tolerated the  procedure well. Findings:      The scout film was normal. The esophagus was successfully intubated       under direct vision. The scope was advanced to a normal major papilla in       the descending duodenum without detailed examination of the pharynx,       larynx and associated structures, and upper GI tract. The upper GI tract       was grossly normal. One stent was removed from the biliary tree using a       snare. The stent was found to be occluded via the water column test. The       bile duct was deeply cannulated with the short-nosed traction       sphincterotome. Contrast was injected. I personally interpreted the bile       duct images. Ductal flow of contrast was adequate. Image quality was       adequate. I personally interpreted the images. Contrast extended to the       main bile duct. The lower third of the main bile duct contained a single       moderate stenosis. A straight Roadrunner wire was passed into the       biliary tree. The biliary tree was swept with a 13.5 mm balloon starting       at the bifurcation. Sludge was swept from the duct. Pus was swept from       the duct. One 10 mm by 4 cm covered metal stent was placed into the       common bile duct. Sludge flowed through the stent. The stent was in good       position. Impression:           - A moderate biliary stricture was found. The stricture                        was malignant appearing.                       - One stent was removed from the biliary tree.                       - The biliary tree was swept and sludge and pus were                        found.                       - One covered metal stent was placed into the common                        bile duct. Recommendation:       - Observe patient's clinical course.                       -  Continue present medications.                       - The findings and recommendations were discussed with                        the patient's family. Procedure  Code(s):    --- Professional ---                       [phone], Endoscopic retrograde cholangiopancreatography                        (ERCP); with removal and exchange of stent(s), biliary                        or pancreatic duct, including pre- and post-dilation                        and guide wire passage, when performed, including                        sphincterotomy, when performed, each stent exchanged                       [id number], Endoscopic retrograde cholangiopancreatography                        (ERCP); with removal of calculi/debris from                        biliary/pancreatic duct(s) Diagnosis Code(s):    --- Professional ---                       K83.1, Obstruction of bile duct                       Z46.59, Encounter for fitting and adjustment of other                        gastrointestinal appliance and device                       R17, Unspecified jaundice CPT copyright 2016 [employer]. All rights reserved. The codes documented in this report are preliminary and upon coder review may  be revised to meet current compliance requirements. Hulen Luster, MD 11/22/2015 5:28:38 PM This report has been signed electronically. Number of Addenda: 0 Note Initiated On: 11/22/2015 4:36 PM      [facility]

## 2015-11-22 NOTE — Progress Notes (Signed)
[place] [facility] [place] NAME: Pamela Preston    MR#:  [id number]  DATE OF BIRTH:  Aug 09, 1930  SUBJECTIVE: And is nothing by mouth, for ERCP today. She feels better with no abdominal pain. No fever.   CHIEF COMPLAINT:   Chief Complaint  Patient presents with  . Weakness   Admitted for weakness and abdominal pain. Found to have fever with elevated liver enzymes and WBC.  Abdominal pain is improved NPO for ERCP Afebrile  REVIEW OF SYSTEMS:    Review of Systems  Constitutional: Positive for malaise/fatigue. Negative for fever and chills.  HENT: Negative for sore throat.   Eyes: Negative for blurred vision, double vision and pain.  Respiratory: Negative for cough, hemoptysis, shortness of breath and wheezing.   Cardiovascular: Negative for chest pain, palpitations, orthopnea and leg swelling.  Gastrointestinal: Negative for heartburn, vomiting, abdominal pain, diarrhea and constipation.  Genitourinary: Negative for dysuria and hematuria.  Musculoskeletal: Negative for back pain and joint pain.  Skin: Negative for rash.  Neurological: Positive for weakness. Negative for sensory change, speech change, focal weakness and headaches.  Endo/Heme/Allergies: Does not bruise/bleed easily.  Psychiatric/Behavioral: Negative for depression. The patient is not nervous/anxious.     DRUG ALLERGIES:   Allergies  Allergen Reactions  . Colchicine   . Ketorolac   . Lactase Diarrhea  . Lipitor [Atorvastatin]     VITALS:  Blood pressure 143/56, pulse 83, temperature 98.6 F (37 C), temperature source Oral, resp. rate 16, height 5\' 8"  (1.727 m), weight 116.665 kg (257 lb 3.2 oz), SpO2 99 %.  PHYSICAL EXAMINATION:   Physical Exam  GENERAL:  82 y.o.-year-old patient lying in the bed with no acute distress.  EYES: Pupils equal, round, reactive to light and accommodation.  Extraocular muscles intact. Icterus positive HEENT: Head atraumatic,  normocephalic. Oropharynx and nasopharynx clear.  NECK:  Supple, no jugular venous distention. No thyroid enlargement, no tenderness.  LUNGS: Normal breath sounds bilaterally, no wheezing, rales, rhonchi. No use of accessory muscles of respiration.  CARDIOVASCULAR: S1, S2 normal. No murmurs, rubs, or gallops.  ABDOMEN: Soft, Tenderness upper abdomen. Nondistended. Bowel sounds present EXTREMITIES: No cyanosis, clubbing or edema b/l.    NEUROLOGIC: Cranial nerves II through XII are intact. No focal Motor or sensory deficits b/l.   PSYCHIATRIC: The patient is alert and oriented x 3.  SKIN: No obvious rash, lesion, or ulcer.   LABORATORY PANEL:   CBC  Recent Labs Lab 11/22/15 0547  WBC 20.8*  HGB 9.1*  HCT 27.3*  PLT 191   ------------------------------------------------------------------------------------------------------------------ Chemistries   Recent Labs Lab 11/15/15 1158  11/22/15 0547  NA 137  < > 140  K 3.2*  < > 3.4*  CL 104  < > 116*  CO2 25  < > 20*  GLUCOSE 88  < > 112*  BUN 23*  < > 29*  CREATININE 1.90*  < > 2.17*  CALCIUM 8.6*  < > 7.5*  MG 1.7  --   --   AST 139*  < > 167*  ALT 138*  < > 124*  ALKPHOS 634*  < > 511*  BILITOT 1.6*  < > 1.7*  < > = values in this interval not displayed. ------------------------------------------------------------------------------------------------------------------  Cardiac Enzymes  Recent Labs Lab 11/20/15 1439  TROPONINI 0.11*   ------------------------------------------------------------------------------------------------------------------  RADIOLOGY:  No results found.   ASSESSMENT AND PLAN:   * Severe Sepsis due to acute cholangitis causing E coli bacteremia, WBC is  coming down. No further fevers. Escherichia coli sensitive to Rocephin, stop the meropenem and started on Rocephin.  ERCP later today She does have a biliary stent in place. , LFTs are still elevated.  * Abnormal liver function tests  and jaundice Due to above  * Acute on chronic renal failure due to ATN from sepsis Improving with IV fluids. Continue fluids Monitor input and output  baseline creatinine 2  * Abnormal troponin which could be secondary to demand ischemia and sepsis repeat troponin stable.   * Pancreatic cancer Recently getting chemotherapy. Outpatient follow-up with oncology  * Anemia of chronic disease  * Insulin-dependent diabetes mellitus; continue Levemir. Blood glucose is better.  * DVT prophylaxis with heparin  Hypokalemia replace the potassium.  Improving. All the records are reviewed and case discussed with Care Management/Social Workerr. Management plans discussed with the patient, family and they are in agreement.  CODE STATUS: FULL  DVT Prophylaxis: SCDs  TOTAL TIME TAKING CARE OF THIS PATIENT: 35 minutes.   POSSIBLE D/C IN 2-3 DAYS, DEPENDING ON CLINICAL CONDITION.  Epifanio Lesches M.D on 11/22/2015 at 9:55 AM  Between 7am to 6pm - Pager - [phone]  After 6pm go to [email or web address] - password [email or web address] [facility]  Office  [phone]  CC: Primary care physician; [name], MD  Note: This dictation was prepared with Dragon dictation along with smaller phrase technology. Any transcriptional errors that result from this process are unintentional.

## 2015-11-22 NOTE — Anesthesia Preprocedure Evaluation (Signed)
Anesthesia Evaluation  Patient identified by MRN, date of birth, ID band Patient awake    Reviewed: Allergy & Precautions, H&P , NPO status , Patient's Chart, lab work & pertinent test results, reviewed documented beta blocker date and time   Airway Mallampati: III   Neck ROM: full    Dental  (+) Poor Dentition   Pulmonary neg pulmonary ROS,    Pulmonary exam normal        Cardiovascular hypertension, negative cardio ROS Normal cardiovascular exam     Neuro/Psych negative neurological ROS  negative psych ROS   GI/Hepatic negative GI ROS, Neg liver ROS, GERD  ,  Endo/Other  negative endocrine ROSdiabetesHypothyroidism Morbid obesity  Renal/GU Renal diseasenegative Renal ROS  negative genitourinary   Musculoskeletal   Abdominal   Peds  Hematology negative hematology ROS (+) anemia ,   Anesthesia Other Findings Past Medical History:   Hypertension                                                 GERD (gastroesophageal reflux disease)                       Diabetes mellitus without complication ([place])                 Breast cancer ([place])                             2013           Comment:c Lumpectomy 2013   Uterine cancer ([place])                            1960         Pancreatic cancer ([place])                                      Abnormal LFTs                                              Past Surgical History:   ABDOMINAL HYSTERECTOMY                                        KNEE ARTHROSCOPY                                              ERCP                                            N/A 09/04/2015      Comment:Procedure: ENDOSCOPIC RETROGRADE               CHOLANGIOPANCREATOGRAPHY (ERCP);  Surgeon: Eddie Dibbles  Johnell Comings, MD;  Location: [facility] ENDOSCOPY;  Service:               Gastroenterology;  Laterality: N/A;   BREAST BIOPSY                                   Right 2012           Comment:Positive   UPPER ESOPHAGEAL  ENDOSCOPIC ULTRASOUND (EUS)    N/A 10/04/2015      Comment:Procedure: UPPER ESOPHAGEAL ENDOSCOPIC               ULTRASOUND (EUS);  Surgeon: Holly Bodily, MD;  Location: [facility] ENDOSCOPY;                Service: Gastroenterology;  Laterality: N/A;   PERIPHERAL VASCULAR CATHETERIZATION             N/A 11/05/2015      Comment:Procedure: Porta Cath Insertion;  Surgeon:               Algernon Huxley, MD;  Location: [facility] CV               LAB;  Service: Cardiovascular;  Laterality:               N/A; BMI    Body Mass Index   39.11 kg/m 2     Reproductive/Obstetrics                             Anesthesia Physical Anesthesia Plan  ASA: IV and emergent  Anesthesia Plan: General   Post-op Pain Management:    Induction:   Airway Management Planned:   Additional Equipment:   Intra-op Plan:   Post-operative Plan:   Informed Consent: I have reviewed the patients History and Physical, chart, labs and discussed the procedure including the risks, benefits and alternatives for the proposed anesthesia with the patient or authorized representative who has indicated his/her understanding and acceptance.   Dental Advisory Given  Plan Discussed with: CRNA  Anesthesia Plan Comments:         Anesthesia Quick Evaluation

## 2015-11-22 NOTE — Progress Notes (Signed)
PT Cancellation Note  Patient Details Name: Pamela Preston MRN: [id number] DOB: 1930/05/01   Cancelled Treatment:    Reason Eval/Treat Not Completed: Patient declined, no reason specified.  Pt refused PT.  PT will re-attempt PT at a later time and date,  Mittie Bodo, SPT  Mittie Bodo 11/22/2015, 1:41 PM

## 2015-11-22 NOTE — Op Note (Signed)
ERCP performed. Previous biliary stent completely clogged. Removed with snare. Cholangiogram showed persistent distal biliary obstruction. Balloon sweep showed lot of sludge and pus. New fully covered metal stent placed over the stricture. Full liquid diet tonight. Continue Abx. Thanks.

## 2015-11-22 NOTE — Progress Notes (Addendum)
Initial Nutrition Assessment   INTERVENTION:   -Await diet progression s/p procedure -Recommend Glucerna Shake po TID, each supplement provides 220 kcal and 10 grams of protein. Will send with styrofoam cup to pur in. Pt agreeable to request Ensure if pt not tolerating metal can Glucerna comes in. -Will clarify no silverware on meal trays, only plastic utensils   NUTRITION DIAGNOSIS:   Unintentional weight loss related to cancer and cancer related treatments as evidenced by percent weight loss.  GOAL:   Patient will meet greater than or equal to 90% of their needs  MONITOR:   PO intake, Supplement acceptance, Diet advancement, Labs, Weight trends, I & O's, Skin  REASON FOR ASSESSMENT:   Malnutrition Screening Tool    ASSESSMENT:   Pt admitted with abnormal LFTs and jandice s/p ERCP and biliary stent January 2017. Per MD note pt with sepsis secondary to possible stent occlusion. Pt with recent h/o pancreatic cancer (2/17) and h/o breast cancer, uterine cancer.  Past Medical History  Diagnosis Date  . Hypertension   . GERD (gastroesophageal reflux disease)   . Diabetes mellitus without complication ([place])   . Breast cancer ([place]) 2013    c Lumpectomy 2013  . Uterine cancer ([place]) 1960  . Pancreatic cancer ([place])   . Abnormal LFTs      Diet Order:  Diet NPO time specified Except for: Sips with Meds    Current Nutrition: Pt NPO. Pt reports tolerating CL yesterday. Pt eating <50% of CL trays yesterday.  Food/Nutrition-Related History: Pt reports appetite has been 'fair.' Pt reports being able to eat cornflakes with milk for breakfast, corndogs for lunch, steak with vegetable such as cabbage for dinner. Family at bedside reports pt used to eat a lot of shrimp but not on chemotherapy so they have been making baked potato soups, and green beans, cabbage, more vegetables since. Pt also reports she has been drinking Glucerna 1-2 every day.   Scheduled Medications:  . amLODipine   10 mg Oral Daily  . aspirin  81 mg Oral Daily  . cefTRIAXone (ROCEPHIN)  IV  1 g Intravenous Q24H  . febuxostat  80 mg Oral Daily  . heparin  5,000 Units Subcutaneous 3 times per day  . insulin aspart  0-5 Units Subcutaneous QHS  . insulin aspart  0-9 Units Subcutaneous TID WC  . insulin detemir  12 Units Subcutaneous Daily  . levothyroxine  75 mcg Oral QAC breakfast  . metoprolol succinate  100 mg Oral Daily  . pantoprazole  40 mg Oral Daily  . sodium chloride flush  3 mL Intravenous Q12H    Continuous Medications:  . 0.45 % NaCl with KCl 20 mEq / L 75 mL/hr at 11/22/15 0845     Electrolyte/Renal Profile and Glucose Profile:   Recent Labs Lab 11/15/15 1158  11/20/15 0821 11/21/15 0605 11/22/15 0547  NA 137  < > 139 142 140  K 3.2*  < > 3.7 3.2* 3.4*  CL 104  < > 109 118* 116*  CO2 25  < > 20* 19* 20*  BUN 23*  < > 40* 36* 29*  CREATININE 1.90*  < > 3.24* 2.78* 2.17*  CALCIUM 8.6*  < > 7.6* 7.1* 7.5*  MG 1.7  --   --   --   --   GLUCOSE 88  < > 250* 118* 112*  < > = values in this interval not displayed. Protein Profile:   Recent Labs Lab 11/20/15 [phone] 11/21/15 9379 11/22/15 0240  ALBUMIN 2.1* 1.9* 1.9*   Hepatic Function Latest Ref Rng 11/22/2015 11/21/2015 11/20/2015  Total Protein 6.5 - 8.1 g/dL 5.4(L) 5.1(L) -  Albumin 3.5 - 5.0 g/dL 1.9(L) 1.9(L) -  AST 15 - 41 U/L 167(H) 149(H) -  ALT 14 - 54 U/L 124(H) 125(H) -  Alk Phosphatase 38 - 126 U/L 511(H) 421(H) -  Total Bilirubin 0.3 - 1.2 mg/dL 1.7(H) 2.0(H) -  Bilirubin, Direct 0.1 - 0.5 mg/dL - - 2.5(H)     Gastrointestinal Profile: Last BM:  11/21/2015   Nutrition-Focused Physical Exam Findings: Nutrition-Focused physical exam completed. Findings are WDL for fat depletion, muscle depletion, and edema.    Weight Change: Pt reports weight of 312lbs December 2017 when she started treatment for pancreatic cancer (radiation/chemotherapy). (18% weight loss in 4 months) Pt reports recently she is still losing  weight, usually a few pounds more each time she is weighed.   Skin:   (Stage II buttocks pressure ulcer)   Height:   Ht Readings from Last 1 Encounters:  11/20/15 5' 8"  (1.727 m)    Weight:   Wt Readings from Last 1 Encounters:  11/20/15 257 lb 3.2 oz (116.665 kg)    Ideal Body Weight:   64kg  BMI:  Body mass index is 39.12 kg/(m^2).  Estimated Nutritional Needs:   Kcal:  1145kcals, TEE: (IF 1.2-1.4)(AF 1.3) 1784-2039kcals, using IBW of 64kg  Protein:  77-96g protein (1.2-1.5g/kg)   Fluid:  1.9-2.2L fluids  EDUCATION NEEDS:   No education needs identified at this time  Dwyane Luo, RD, LDN Pager [phone] Weekend/On-Call Pager [phone]

## 2015-11-22 NOTE — Transfer of Care (Signed)
Immediate Anesthesia Transfer of Care Note  Patient: Pamela Preston  Procedure(s) Performed: Procedure(s) with comments: ENDOSCOPIC RETROGRADE CHOLANGIOPANCREATOGRAPHY (ERCP) (N/A) - For Thursday afternoon  Patient Location: PACU  Anesthesia Type:General  Level of Consciousness: awake, alert , oriented and patient cooperative  Airway & Oxygen Therapy: Patient Spontanous Breathing and Patient connected to nasal cannula oxygen  Post-op Assessment: Report given to RN and Post -op Vital signs reviewed and stable  Post vital signs: Reviewed and stable  Last Vitals:  Filed Vitals:   11/22/15 1314 11/22/15 1538  BP: 133/47 135/45  Pulse: 77 78  Temp:  36.9 C  Resp: 20 20    Complications: No apparent anesthesia complications

## 2015-11-22 NOTE — Progress Notes (Signed)
Chaplain rounded the unit and provided a compassionate presence and support to the patient. Chaplain Adolphus Hanf [phone] 

## 2015-11-22 NOTE — Anesthesia Postprocedure Evaluation (Signed)
Anesthesia Post Note  Patient: Pamela Preston  Procedure(s) Performed: Procedure(s) (LRB): ENDOSCOPIC RETROGRADE CHOLANGIOPANCREATOGRAPHY (ERCP) (N/A)  Patient location during evaluation: PACU Anesthesia Type: General Level of consciousness: awake and alert Pain management: pain level controlled Vital Signs Assessment: post-procedure vital signs reviewed and stable Respiratory status: spontaneous breathing, nonlabored ventilation, respiratory function stable and patient connected to nasal cannula oxygen Cardiovascular status: blood pressure returned to baseline and stable Postop Assessment: no signs of nausea or vomiting Anesthetic complications: no    Last Vitals:  Filed Vitals:   11/22/15 1805 11/22/15 1826  BP:  127/54  Pulse:  72  Temp: 36.3 C 36.4 C  Resp:  18    Last Pain:  Filed Vitals:   11/22/15 1842  PainSc: 0-No pain                 Molli Barrows

## 2015-11-22 NOTE — Care Management Important Message (Signed)
Important Message  Patient Details  Name: Pamela Preston MRN: [id number] Date of Birth: 12/31/1929   Medicare Important Message Given:  Yes    [name] [name] 11/22/2015, 1:45 PM

## 2015-11-22 NOTE — Anesthesia Procedure Notes (Signed)
Procedure Name: Intubation Date/Time: 11/22/2015 4:47 PM Performed by: Rosaria Ferries, Averianna Brugger Pre-anesthesia Checklist: Patient identified, Emergency Drugs available, Suction available and Patient being monitored Patient Re-evaluated:Patient Re-evaluated prior to inductionOxygen Delivery Method: Circle system utilized Preoxygenation: Pre-oxygenation with 100% oxygen Intubation Type: IV induction Laryngoscope Size: Mac and 3 Grade View: Grade I Tube size: 7.0 mm Number of attempts: 1 Placement Confirmation: ETT inserted through vocal cords under direct vision,  positive ETCO2 and breath sounds checked- equal and bilateral Secured at: 22 cm Tube secured with: Tape Dental Injury: Teeth and Oropharynx as per pre-operative assessment

## 2015-11-23 ENCOUNTER — Ambulatory Visit: Payer: Medicare Other

## 2015-11-23 LAB — COMPREHENSIVE METABOLIC PANEL
ALK PHOS: 464 U/L — AB (ref 38–126)
ALT: 93 U/L — ABNORMAL HIGH (ref 14–54)
ANION GAP: 4 — AB (ref 5–15)
AST: 94 U/L — ABNORMAL HIGH (ref 15–41)
Albumin: 2 g/dL — ABNORMAL LOW (ref 3.5–5.0)
BUN: 23 mg/dL — AB (ref 6–20)
CALCIUM: 7.6 mg/dL — AB (ref 8.9–10.3)
CO2: 20 mmol/L — AB (ref 22–32)
Chloride: 113 mmol/L — ABNORMAL HIGH (ref 101–111)
Creatinine, Ser: 1.69 mg/dL — ABNORMAL HIGH (ref 0.44–1.00)
GFR calc Af Amer: 31 mL/min — ABNORMAL LOW (ref >60)
GFR calc non Af Amer: 27 mL/min — ABNORMAL LOW (ref >60)
GLUCOSE: 145 mg/dL — AB (ref 65–99)
Potassium: 3.4 mmol/L — ABNORMAL LOW (ref 3.5–5.1)
SODIUM: 137 mmol/L (ref 135–145)
Total Bilirubin: 1.5 mg/dL — ABNORMAL HIGH (ref 0.3–1.2)
Total Protein: 5.4 g/dL — ABNORMAL LOW (ref 6.5–8.1)

## 2015-11-23 LAB — GLUCOSE, CAPILLARY
GLUCOSE-CAPILLARY: 203 mg/dL — AB (ref 65–99)
Glucose-Capillary: 167 mg/dL — ABNORMAL HIGH (ref 65–99)
Glucose-Capillary: 140 mg/dL — ABNORMAL HIGH (ref 65–99)
Glucose-Capillary: 218 mg/dL — ABNORMAL HIGH (ref 65–99)
Glucose-Capillary: 144 mg/dL — ABNORMAL HIGH (ref 65–99)

## 2015-11-23 LAB — CBC
HEMATOCRIT: 27.1 % — AB (ref 35.0–47.0)
HEMOGLOBIN: 9.2 g/dL — AB (ref 12.0–16.0)
MCH: 30.3 pg (ref 26.0–34.0)
MCHC: 33.9 g/dL (ref 32.0–36.0)
MCV: 89.2 fL (ref 80.0–100.0)
Platelets: 186 10*3/uL (ref 150–440)
RBC: 3.03 MIL/uL — AB (ref 3.80–5.20)
RDW: 17.1 % — AB (ref 11.5–14.5)
WBC: 9.2 10*3/uL (ref 3.6–11.0)

## 2015-11-23 MED ORDER — GLUCERNA SHAKE PO LIQD
237.0000 mL | Freq: Three times a day (TID) | ORAL | Status: DC
  Administered 2015-11-23 – 2015-11-25 (×7): 237 mL via ORAL

## 2015-11-23 NOTE — Consult Note (Signed)
  S/P stent exchange to metal stent. WBC now normal. LFT coming down. Will advance diet. Will need at least 7 day course of IV/PO Abx. Will sign off. Pt can f/u with [place] in office in few weeks. Thanks.

## 2015-11-23 NOTE — Progress Notes (Signed)
Patient's sister very upset that SCDs were not put on after surgery.  I explained that patient was on SQ heparin, but also initiated SCDs on this shift.

## 2015-11-23 NOTE — Progress Notes (Signed)
PT Cancellation Note  Patient Details Name: Pamela Preston MRN: [id number] DOB: 1930-03-09   Cancelled Treatment:    Reason Eval/Treat Not Completed: Patient declined, no reason specified.  Pt refused PT for the third time.  Per PT protocol, will discontinue PT order.  Pt was educated on notifying MD for re-consulting PT if pt willing to participate in PT.  Pt and pt's visitor verbalized understanding.  Please re-consult PT when medically appropriate and pt's willingness to participate.    Mittie Bodo, SPT Mittie Bodo 11/23/2015, 1:56 PM

## 2015-11-23 NOTE — Progress Notes (Signed)
Assumed care of patient from Edwin Dada, RN at [phone]. Madlyn Frankel, RN

## 2015-11-23 NOTE — Progress Notes (Signed)
[place] at [place] NAME: Pamela Preston    MR#:  [id number]  DATE OF BIRTH:  [birth year]  SUBJECTIVE: Status post ERCP study showing completely occluded biliary stent status post balloon swep of sludge and PUS.she feels  much better today. No fever.   CHIEF COMPLAINT:   Chief Complaint  Patient presents with  . Weakness   Admitted for weakness and abdominal pain. Found to have fever with elevated liver enzymes and WBC.    REVIEW OF SYSTEMS:    Review of Systems  Constitutional: Positive for malaise/fatigue. Negative for fever and chills.  HENT: Negative for sore throat.   Eyes: Negative for blurred vision, double vision and pain.  Respiratory: Negative for cough, hemoptysis, shortness of breath and wheezing.   Cardiovascular: Negative for chest pain, palpitations, orthopnea and leg swelling.  Gastrointestinal: Negative for heartburn, vomiting, abdominal pain, diarrhea and constipation.  Genitourinary: Negative for dysuria and hematuria.  Musculoskeletal: Negative for back pain and joint pain.  Skin: Negative for rash.  Neurological: Positive for weakness. Negative for sensory change, speech change, focal weakness and headaches.  Endo/Heme/Allergies: Does not bruise/bleed easily.  Psychiatric/Behavioral: Negative for depression. The patient is not nervous/anxious.     DRUG ALLERGIES:   Allergies  Allergen Reactions  . Colchicine   . Ketorolac   . Lactase Diarrhea  . Lipitor [Atorvastatin]     VITALS:  Blood pressure 140/47, pulse 78, temperature 98.4 F (36.9 C), temperature source Oral, resp. rate 18, height 5\' 8"  (1.727 m), weight 116.665 kg (257 lb 3.2 oz), SpO2 95 %.  PHYSICAL EXAMINATION:   Physical Exam  GENERAL:  82 y.o.-year-old patient lying in the bed with no acute distress.  EYES: Pupils equal, round, reactive to light and accommodation.  Extraocular muscles intact. Icterus positive HEENT: Head  atraumatic, normocephalic. Oropharynx and nasopharynx clear.  NECK:  Supple, no jugular venous distention. No thyroid enlargement, no tenderness.  LUNGS: Normal breath sounds bilaterally, no wheezing, rales, rhonchi. No use of accessory muscles of respiration.  CARDIOVASCULAR: S1, S2 normal. No murmurs, rubs, or gallops.  ABDOMEN: Soft, Tenderness upper abdomen. Nondistended. Bowel sounds present EXTREMITIES: No cyanosis, clubbing or edema b/l.    NEUROLOGIC: Cranial nerves II through XII are intact. No focal Motor or sensory deficits b/l.   PSYCHIATRIC: The patient is alert and oriented x 3.  SKIN: No obvious rash, lesion, or ulcer.   LABORATORY PANEL:   CBC  Recent Labs Lab 11/23/15 0517  WBC 9.2  HGB 9.2*  HCT 27.1*  PLT 186   ------------------------------------------------------------------------------------------------------------------ Chemistries   Recent Labs Lab 11/23/15 0517  NA 137  K 3.4*  CL 113*  CO2 20*  GLUCOSE 145*  BUN 23*  CREATININE 1.69*  CALCIUM 7.6*  AST 94*  ALT 93*  ALKPHOS 464*  BILITOT 1.5*   ------------------------------------------------------------------------------------------------------------------  Cardiac Enzymes  Recent Labs Lab 11/20/15 1439  TROPONINI 0.11*   ------------------------------------------------------------------------------------------------------------------  RADIOLOGY:  Dg C-arm 1-60 Min-no Report  11/22/2015  CLINICAL DATA: ercp, replace stent C-ARM 1-60 MINUTES Fluoroscopy was utilized by the requesting physician.  No radiographic interpretation.     ASSESSMENT AND PLAN:   * Severe Sepsis due to acute cholangitis causing E coli bacteremia, WBC is coming down. No further fevers. Escherichia coli sensitive to Rocephin, stop the meropenem and started on Rocephin.  * Jaundice with cholangitis secondary to biliary stent infection s; status post ERCP, old biliary stent is infected,that removed ,pus also  sweeped out.new  mental stent is placed over malignant  Stricture. [name] LFTs are normalizing. No fever. Continue Rocephin.,rpt blood cultures today  * Acute on chronic renal failure due to ATN from sepsis Improved with iv fluids.Burnis Medin discontinue IV hydration today.  Monitor input and output  baseline creatinine 2  * Abnormal troponin which could be secondary to demand ischemia and sepsis repeat troponin stable.   * Pancreatic cancer Recently getting chemotherapy. Outpatient follow-up with oncology  * Anemia of chronic disease  * Insulin-dependent diabetes mellitus; continue Levemir. Blood glucose is better.  * DVT prophylaxis with heparin  Hypokalemia replace the potassium.  Deconditioning;;   PT consult  Today  Improving. All the records are reviewed and case discussed with Care Management/Social Workerr. Management plans discussed with the patient, family and they are in agreement.  CODE STATUS: FULL  DVT Prophylaxis: SCDs  TOTAL TIME TAKING CARE OF THIS PATIENT: 35 minutes.   POSSIBLE D/C IN 2-3 DAYS, DEPENDING ON CLINICAL CONDITION.  Epifanio Lesches M.D on 11/23/2015 at 10:50 AM  Between 7am to 6pm - Pager - [phone]  After 6pm go to [email or web address] - password [email or web address] [facility]  Office  [phone]  CC: Primary care physician; [name], MD  Note: This dictation was prepared with Dragon dictation along with smaller phrase technology. Any transcriptional errors that result from this process are unintentional.

## 2015-11-24 LAB — COMPREHENSIVE METABOLIC PANEL
ALT: 86 U/L — AB (ref 14–54)
AST: 84 U/L — AB (ref 15–41)
Albumin: 1.9 g/dL — ABNORMAL LOW (ref 3.5–5.0)
Alkaline Phosphatase: 515 U/L — ABNORMAL HIGH (ref 38–126)
Anion gap: 5 (ref 5–15)
BILIRUBIN TOTAL: 1.3 mg/dL — AB (ref 0.3–1.2)
BUN: 20 mg/dL (ref 6–20)
CALCIUM: 8 mg/dL — AB (ref 8.9–10.3)
CO2: 19 mmol/L — ABNORMAL LOW (ref 22–32)
CREATININE: 1.49 mg/dL — AB (ref 0.44–1.00)
Chloride: 108 mmol/L (ref 101–111)
GFR, EST AFRICAN AMERICAN: 36 mL/min — AB (ref >60)
GFR, EST NON AFRICAN AMERICAN: 31 mL/min — AB (ref >60)
Glucose, Bld: 128 mg/dL — ABNORMAL HIGH (ref 65–99)
Potassium: 3.3 mmol/L — ABNORMAL LOW (ref 3.5–5.1)
Sodium: 132 mmol/L — ABNORMAL LOW (ref 135–145)
Total Protein: 5.1 g/dL — ABNORMAL LOW (ref 6.5–8.1)

## 2015-11-24 LAB — CULTURE, BLOOD (ROUTINE X 2)

## 2015-11-24 LAB — GLUCOSE, CAPILLARY
GLUCOSE-CAPILLARY: 226 mg/dL — AB (ref 65–99)
GLUCOSE-CAPILLARY: 283 mg/dL — AB (ref 65–99)
Glucose-Capillary: 136 mg/dL — ABNORMAL HIGH (ref 65–99)
Glucose-Capillary: 323 mg/dL — ABNORMAL HIGH (ref 65–99)
Glucose-Capillary: 262 mg/dL — ABNORMAL HIGH (ref 65–99)

## 2015-11-24 MED ORDER — CIPROFLOXACIN HCL 500 MG PO TABS: 500.0000 mg | ORAL_TABLET | Freq: Two times a day (BID) | ORAL | Status: DC

## 2015-11-24 MED ORDER — CIPROFLOXACIN HCL 500 MG PO TABS
500.0000 mg | ORAL_TABLET | Freq: Two times a day (BID) | ORAL | Status: DC
  Administered 2015-11-24 – 2015-11-25 (×2): 500 mg via ORAL
  Filled 2015-11-24 (×2): qty 1

## 2015-11-24 MED ORDER — HYDROCODONE-ACETAMINOPHEN 5-325 MG PO TABS: 1.0000 | ORAL_TABLET | Freq: Four times a day (QID) | ORAL | Status: DC | PRN

## 2015-11-24 MED ORDER — INSULIN GLARGINE 100 UNIT/ML ~~LOC~~ SOLN: 14.0000 [IU] | Freq: Two times a day (BID) | SUBCUTANEOUS | Status: DC

## 2015-11-24 MED ORDER — POTASSIUM CHLORIDE CRYS ER 20 MEQ PO TBCR
40.0000 meq | EXTENDED_RELEASE_TABLET | ORAL | Status: AC
  Administered 2015-11-24 (×2): 40 meq via ORAL
  Filled 2015-11-24 (×2): qty 2

## 2015-11-24 MED ORDER — FUROSEMIDE 20 MG PO TABS
20.0000 mg | ORAL_TABLET | Freq: Every day | ORAL | Status: DC
  Administered 2015-11-24 – 2015-11-25 (×2): 20 mg via ORAL
  Filled 2015-11-24 (×2): qty 1

## 2015-11-24 NOTE — Discharge Summary (Addendum)
[facility] [place] NAME: Pamela Preston    MR#:  [id number]  DATE OF BIRTH:  1930-01-29  DATE OF ADMISSION:  11/19/2015 ADMITTING PHYSICIAN: Saundra Shelling, MD  DATE OF DISCHARGE: 11/25/2015  PRIMARY CARE PHYSICIAN: [name], MD   ADMISSION DIAGNOSIS:  Icterus [R17]  DISCHARGE DIAGNOSIS:  Principal Problem:   Sepsis ([place]) Active Problems:   Jaundice   Pressure ulcer   SECONDARY DIAGNOSIS:   Past Medical History  Diagnosis Date  . Hypertension   . GERD (gastroesophageal reflux disease)   . Diabetes mellitus without complication ([place])   . Breast cancer ([place]) 2013    c Lumpectomy 2013  . Uterine cancer ([place]) 1960  . Pancreatic cancer ([place])   . Abnormal LFTs      ADMITTING HISTORY  HISTORY OF PRESENT ILLNESS: Pamela Preston is a 82 y.o. female with a known history of Pancreatic cancer, breast cancer, uterine cancer, hypertension, GERD, biliary stent presented to the emergency room with weakness and abdominal pain for the last few days. He should also had some nausea and vomiting. No history of hematemesis hemoptysis. Abdominal pain is generalized 4 out of 10 on a scale of 1-10 and aching in nature. Patient was evaluated in the emergency room was found to have elevated bilirubin and elevated liver function tests. Patient bedside lactate level was also very high. No history of any chest pain. No history of any shortness of breath. No history of any rectal bleed. Patient had ERCP done in January 2017 and also has biliary stent.  HOSPITAL COURSE:   * Severe Sepsis due to acute cholangitis causing E coli bacteremia WBC is  Normal. No further fevers.  Escherichia coli pansensitive.  Initially was on meropenem which was later switched to Rocephin. We will treat for 11 more days with oral ciprofloxacin. Follow-up with GI as outpatient.  * Acute on chronic renal failure due to ATN from sepsis Improved with iv  fluids.  * Abnormal troponin which could be secondary to demand ischemia and sepsis repeat troponin stable.  Seen by cardiology. No further testing advised.  * Pancreatic cancer Recently getting chemotherapy. Outpatient follow-up with oncology  * Anemia of chronic disease Stable  * Hypokalemia. Replaced orally.  * Insulin-dependent diabetes mellitus Glipizide and insulin  * DVT prophylaxis with heparin  Stable for discharge to skilled nursing facility for further physical therapy.  Follow-up with primary care physician and Dr. Candace Cruise  CONSULTS OBTAINED:  Treatment Team:  Lollie Sails, MD Teodoro Spray, MD  DRUG ALLERGIES:   Allergies  Allergen Reactions  . Colchicine   . Ketorolac   . Lactase Diarrhea  . Lipitor [Atorvastatin]     DISCHARGE MEDICATIONS:   Current Discharge Medication List    START taking these medications   Details  ciprofloxacin (CIPRO) 500 MG tablet Take 1 tablet (500 mg total) by mouth 2 (two) times daily. Qty: 22 tablet, Refills: 0      CONTINUE these medications which have CHANGED   Details  HYDROcodone-acetaminophen (NORCO/VICODIN) 5-325 MG tablet Take 1 tablet by mouth every 6 (six) hours as needed for moderate pain. Qty: 20 tablet, Refills: 0    insulin glargine (LANTUS) 100 UNIT/ML injection Inject 0.14 mLs (14 Units total) into the skin 2 (two) times daily. Qty: 10 mL, Refills: 0      CONTINUE these medications which have NOT CHANGED   Details  amLODipine (NORVASC) 10 MG tablet Take 10 mg by  mouth daily.    aspirin 81 MG tablet Take 81 mg by mouth daily.    buPROPion (WELLBUTRIN SR) 150 MG 12 hr tablet Take 150 mg by mouth 2 (two) times daily.    febuxostat (ULORIC) 40 MG tablet Take 80 mg by mouth daily.    furosemide (LASIX) 20 MG tablet Take 20 mg by mouth daily.    glipiZIDE (GLUCOTROL) 5 MG tablet Take 5 mg by mouth daily before breakfast.     letrozole (FEMARA) 2.5 MG tablet Take 2.5 mg by mouth daily.     levothyroxine (SYNTHROID, LEVOTHROID) 75 MCG tablet Take 75 mcg by mouth daily before breakfast.    lisinopril (PRINIVIL,ZESTRIL) 40 MG tablet Take 40 mg by mouth daily.    loperamide (IMODIUM) 2 MG capsule TK 1 C PO Q 6 H PRN FOR DIARRHEA OR LOOSE STOOLS Refills: 0   Associated Diagnoses: Primary malignant neoplasm of head of pancreas (HCC)    metoprolol succinate (TOPROL-XL) 100 MG 24 hr tablet Take 100 mg by mouth daily. Take with or immediately following a meal.    omeprazole (PRILOSEC) 20 MG capsule Take 20 mg by mouth daily.    potassium chloride 20 MEQ TBCR Take 20 mEq by mouth 2 (two) times daily. Qty: 30 tablet, Refills: 0   Associated Diagnoses: Hypokalemia    pravastatin (PRAVACHOL) 40 MG tablet Take 40 mg by mouth daily.        Today   VITAL SIGNS:  Blood pressure 128/52, pulse 78, temperature 98.7 F (37.1 C), temperature source Oral, resp. rate 18, height 5\' 8"  (1.727 m), weight 116.665 kg (257 lb 3.2 oz), SpO2 100 %.  I/O:    Intake/Output Summary (Last 24 hours) at 11/25/15 0842 Last data filed at 11/24/15 1700  Gross per 24 hour  Intake    360 ml  Output      0 ml  Net    360 ml    PHYSICAL EXAMINATION:  Physical Exam  GENERAL:  82 y.o.-year-old patient lying in the bed with no acute distress. Obese. LUNGS: Normal breath sounds bilaterally, no wheezing, rales,rhonchi or crepitation. No use of accessory muscles of respiration.  CARDIOVASCULAR: S1, S2 normal. No murmurs, rubs, or gallops.  ABDOMEN: Soft, non-tender, non-distended. Bowel sounds present. No organomegaly or mass.  NEUROLOGIC: Moves all 4 extremities. PSYCHIATRIC: The patient is alert and oriented x 3.  SKIN: No obvious rash, lesion, or ulcer.   DATA REVIEW:   CBC  Recent Labs Lab 11/23/15 0517  WBC 9.2  HGB 9.2*  HCT 27.1*  PLT 186    Chemistries   Recent Labs Lab 11/24/15 0448  NA 132*  K 3.3*  CL 108  CO2 19*  GLUCOSE 128*  BUN 20  CREATININE 1.49*  CALCIUM  8.0*  AST 84*  ALT 86*  ALKPHOS 515*  BILITOT 1.3*    Cardiac Enzymes  Recent Labs Lab 11/20/15 1439  TROPONINI 0.11*    Microbiology Results  Results for orders placed or performed during the hospital encounter of 11/19/15  Blood culture (single)     Status: None   Collection Time: 11/19/15  8:14 PM  Result Value Ref Range Status   Specimen Description BLOOD RT [facility]  Final   Special Requests   Final    BOTTLES DRAWN AEROBIC AND ANAEROBIC AER 8CC ANA 10CC   Culture  Setup Time   Final    GRAM NEGATIVE RODS IN BOTH AEROBIC AND ANAEROBIC BOTTLES CRITICAL RESULT CALLED TO, READ BACK  BY AND VERIFIED WITH: CHRISTINE KATSOUDAS AT [id number] ON 11/20/15. CTJ    Culture   Final    ESCHERICHIA COLI IN BOTH AEROBIC AND ANAEROBIC BOTTLES    Report Status 11/22/2015 FINAL  Final   Organism ID, Bacteria ESCHERICHIA COLI  Final      Susceptibility   Escherichia coli - MIC*    AMPICILLIN >=32 RESISTANT Resistant     CEFAZOLIN 32 RESISTANT Resistant     CEFEPIME <=1 SENSITIVE Sensitive     CEFTAZIDIME <=1 SENSITIVE Sensitive     CEFTRIAXONE <=1 SENSITIVE Sensitive     CIPROFLOXACIN <=0.25 SENSITIVE Sensitive     GENTAMICIN <=1 SENSITIVE Sensitive     IMIPENEM <=0.25 SENSITIVE Sensitive     TRIMETH/SULFA <=20 SENSITIVE Sensitive     AMPICILLIN/SULBACTAM >=32 RESISTANT Resistant     PIP/TAZO >=128 RESISTANT Resistant     Extended ESBL NEGATIVE Sensitive     * ESCHERICHIA COLI  Blood Culture ID Panel (Reflexed)     Status: Abnormal   Collection Time: 11/19/15  8:14 PM  Result Value Ref Range Status   Enterococcus species NOT DETECTED NOT DETECTED Final   Vancomycin resistance NOT DETECTED NOT DETECTED Final   Listeria monocytogenes NOT DETECTED NOT DETECTED Final   Staphylococcus species NOT DETECTED NOT DETECTED Final   Staphylococcus aureus NOT DETECTED NOT DETECTED Final   Methicillin resistance NOT DETECTED NOT DETECTED Final   Streptococcus species NOT DETECTED NOT DETECTED Final    Streptococcus agalactiae NOT DETECTED NOT DETECTED Final   Streptococcus pneumoniae NOT DETECTED NOT DETECTED Final   Streptococcus pyogenes NOT DETECTED NOT DETECTED Final   Acinetobacter baumannii NOT DETECTED NOT DETECTED Final   Enterobacteriaceae species DETECTED (A) NOT DETECTED Final    Comment: CRITICAL RESULT CALLED TO, READ BACK BY AND VERIFIED WITH: Mayo FOR ENTEROBACTERIACEAE AND E. COLI AT 1247 ON 11/20/15. CTJ    Enterobacter cloacae complex NOT DETECTED NOT DETECTED Final   Escherichia coli DETECTED (A) NOT DETECTED Corrected    Comment: CORRECTED ON 04/05 AT 0932: PREVIOUSLY REPORTED AS NOT DETECTED DETECTED   Klebsiella oxytoca NOT DETECTED NOT DETECTED Final   Klebsiella pneumoniae NOT DETECTED NOT DETECTED Final   Proteus species NOT DETECTED NOT DETECTED Final   Serratia marcescens NOT DETECTED NOT DETECTED Final   Carbapenem resistance NOT DETECTED NOT DETECTED Final   Haemophilus influenzae NOT DETECTED NOT DETECTED Final   Neisseria meningitidis NOT DETECTED NOT DETECTED Final   Pseudomonas aeruginosa NOT DETECTED NOT DETECTED Final   Candida albicans NOT DETECTED NOT DETECTED Final   Candida glabrata NOT DETECTED NOT DETECTED Final   Candida krusei NOT DETECTED NOT DETECTED Final   Candida parapsilosis NOT DETECTED NOT DETECTED Final   Candida tropicalis NOT DETECTED NOT DETECTED Final  MRSA PCR Screening     Status: None   Collection Time: 11/20/15  6:34 AM  Result Value Ref Range Status   MRSA by PCR NEGATIVE NEGATIVE Final    Comment:        The GeneXpert MRSA Assay (FDA approved for NASAL specimens only), is one component of a comprehensive MRSA colonization surveillance program. It is not intended to diagnose MRSA infection nor to guide or monitor treatment for MRSA infections.   C difficile quick scan w PCR reflex     Status: None   Collection Time: 11/20/15  6:34 AM  Result Value Ref Range Status   C Diff antigen NEGATIVE NEGATIVE  Final   C Diff toxin NEGATIVE NEGATIVE Final  C Diff interpretation Negative for C. difficile  Final  Culture, blood (x 2)     Status: Abnormal (Preliminary result)   Collection Time: 11/20/15  8:21 AM  Result Value Ref Range Status   Specimen Description BLOOD LEFT AC  Final   Special Requests   Final    BOTTLES DRAWN AEROBIC AND ANAEROBIC AER 6ML ANA 4ML   Culture  Setup Time   Final    GRAM NEGATIVE RODS AEROBIC BOTTLE ONLY CRITICAL RESULT CALLED TO, READ BACK BY AND VERIFIED WITH: MATT MCBANE AT 0219 ON 11/25/15 RWW Organism ID to follow CONFIRMED BY [facility]    Culture (A)  Final    ESCHERICHIA COLI AEROBIC BOTTLE ONLY SUSCEPTIBILITIES TO FOLLOW    Report Status PENDING  Incomplete  Blood Culture ID Panel (Reflexed)     Status: Abnormal   Collection Time: 11/20/15  8:21 AM  Result Value Ref Range Status   Enterococcus species NOT DETECTED NOT DETECTED Final   Vancomycin resistance NOT DETECTED NOT DETECTED Final   Listeria monocytogenes NOT DETECTED NOT DETECTED Final   Staphylococcus species NOT DETECTED NOT DETECTED Final   Staphylococcus aureus NOT DETECTED NOT DETECTED Final   Methicillin resistance NOT DETECTED NOT DETECTED Final   Streptococcus species NOT DETECTED NOT DETECTED Final   Streptococcus agalactiae NOT DETECTED NOT DETECTED Final   Streptococcus pneumoniae NOT DETECTED NOT DETECTED Final   Streptococcus pyogenes NOT DETECTED NOT DETECTED Final   Acinetobacter baumannii NOT DETECTED NOT DETECTED Final   Enterobacteriaceae species DETECTED (A) NOT DETECTED Final    Comment: CRITICAL RESULT CALLED TO, READ BACK BY AND VERIFIED WITH: MATT MCBANE AT 0219 ON 11/25/15 RWW    Enterobacter cloacae complex NOT DETECTED NOT DETECTED Final   Escherichia coli DETECTED (A) NOT DETECTED Final    Comment: CRITICAL RESULT CALLED TO, READ BACK BY AND VERIFIED WITH: MATT MCBANE AT 0219 ON 11/25/15 RWW    Klebsiella oxytoca NOT DETECTED NOT DETECTED Final   Klebsiella  pneumoniae NOT DETECTED NOT DETECTED Final   Proteus species NOT DETECTED NOT DETECTED Final   Serratia marcescens NOT DETECTED NOT DETECTED Final   Carbapenem resistance NOT DETECTED NOT DETECTED Final   Haemophilus influenzae NOT DETECTED NOT DETECTED Final   Neisseria meningitidis NOT DETECTED NOT DETECTED Final   Pseudomonas aeruginosa NOT DETECTED NOT DETECTED Final   Candida albicans NOT DETECTED NOT DETECTED Final   Candida glabrata NOT DETECTED NOT DETECTED Final   Candida krusei NOT DETECTED NOT DETECTED Final   Candida parapsilosis NOT DETECTED NOT DETECTED Final   Candida tropicalis NOT DETECTED NOT DETECTED Final  Culture, blood (x 2)     Status: Abnormal   Collection Time: 11/20/15  9:04 AM  Result Value Ref Range Status   Specimen Description BLOOD LEFT ARM  Final   Special Requests   Final    BOTTLES DRAWN AEROBIC AND ANAEROBIC ANA 7ML AER 3ML   Culture  Setup Time   Final    GRAM NEGATIVE RODS IN BOTH AEROBIC AND ANAEROBIC BOTTLES CRITICAL VALUE NOTED.  VALUE IS CONSISTENT WITH PREVIOUSLY REPORTED AND CALLED VALUE.    Culture (A)  Final    ESCHERICHIA COLI IN BOTH AEROBIC AND ANAEROBIC BOTTLES    Report Status 11/24/2015 FINAL  Final   Organism ID, Bacteria ESCHERICHIA COLI  Final      Susceptibility   Escherichia coli - MIC*    AMPICILLIN >=32 RESISTANT Resistant     CEFAZOLIN 32 RESISTANT Resistant  CEFEPIME <=1 SENSITIVE Sensitive     CEFTAZIDIME <=1 SENSITIVE Sensitive     CEFTRIAXONE <=1 SENSITIVE Sensitive     CIPROFLOXACIN <=0.25 SENSITIVE Sensitive     GENTAMICIN <=1 SENSITIVE Sensitive     IMIPENEM <=0.25 SENSITIVE Sensitive     TRIMETH/SULFA <=20 SENSITIVE Sensitive     AMPICILLIN/SULBACTAM >=32 RESISTANT Resistant     PIP/TAZO >=128 RESISTANT Resistant     Extended ESBL NEGATIVE Sensitive     * ESCHERICHIA COLI  Gastrointestinal Panel by PCR , Stool     Status: None   Collection Time: 11/20/15  6:39 PM  Result Value Ref Range Status    Campylobacter species NOT DETECTED NOT DETECTED Final   Plesimonas shigelloides NOT DETECTED NOT DETECTED Final   Salmonella species NOT DETECTED NOT DETECTED Final   Yersinia enterocolitica NOT DETECTED NOT DETECTED Final   Vibrio species NOT DETECTED NOT DETECTED Final   Vibrio cholerae NOT DETECTED NOT DETECTED Final   Enteroaggregative E coli (EAEC) NOT DETECTED NOT DETECTED Final   Enteropathogenic E coli (EPEC) NOT DETECTED NOT DETECTED Final   Enterotoxigenic E coli (ETEC) NOT DETECTED NOT DETECTED Final   Shiga like toxin producing E coli (STEC) NOT DETECTED NOT DETECTED Final   E. coli O157 NOT DETECTED NOT DETECTED Final   Shigella/Enteroinvasive E coli (EIEC) NOT DETECTED NOT DETECTED Final   Cryptosporidium NOT DETECTED NOT DETECTED Final   Cyclospora cayetanensis NOT DETECTED NOT DETECTED Final   Entamoeba histolytica NOT DETECTED NOT DETECTED Final   Giardia lamblia NOT DETECTED NOT DETECTED Final   Adenovirus F40/41 NOT DETECTED NOT DETECTED Final   Astrovirus NOT DETECTED NOT DETECTED Final   Norovirus GI/GII NOT DETECTED NOT DETECTED Final   Rotavirus A NOT DETECTED NOT DETECTED Final   Sapovirus (I, II, IV, and V) NOT DETECTED NOT DETECTED Final  CULTURE, BLOOD (ROUTINE X 2) w Reflex to PCR ID Panel     Status: None (Preliminary result)   Collection Time: 11/23/15 11:31 AM  Result Value Ref Range Status   Specimen Description BLOOD LEFT ANTECUBITAL  Final   Special Requests BOTTLES DRAWN AEROBIC AND ANAEROBIC  10CC  Final   Culture NO GROWTH 1 DAY  Final   Report Status PENDING  Incomplete  CULTURE, BLOOD (ROUTINE X 2) w Reflex to PCR ID Panel     Status: None (Preliminary result)   Collection Time: 11/23/15 11:34 AM  Result Value Ref Range Status   Specimen Description BLOOD LEFT FOREARM  Final   Special Requests BOTTLES DRAWN AEROBIC AND ANAEROBIC  6CC  Final   Culture NO GROWTH 1 DAY  Final   Report Status PENDING  Incomplete    RADIOLOGY:  No results  found.  Follow up with PCP in 1 week.  Management plans discussed with the patient, family and they are in agreement.  CODE STATUS:     Code Status Orders        Start     Ordered   11/20/15 0133  Full code   Continuous     11/20/15 0134    Code Status History    Date Active Date Inactive Code Status Order ID Comments User Context   08/31/2015  3:49 PM 09/06/2015  3:18 PM Full Code [id number]  Epifanio Lesches, MD Inpatient      TOTAL TIME TAKING CARE OF THIS PATIENT ON DAY OF DISCHARGE: more than 30 minutes.   Hillary Bow R M.D on 11/25/2015 at 8:41 AM  Between 7am to  6pm - Pager - [phone]  After 6pm go to [email or web address] - password [email or web address] [facility] [place] [facility]  Office  [phone]  CC: Primary care physician; [name], MD  Note: This dictation was prepared with Dragon dictation along with smaller phrase technology. Any transcriptional errors that result from this process are unintentional.

## 2015-11-24 NOTE — Evaluation (Signed)
Physical Therapy Evaluation Patient Details Name: Pamela Preston MRN: [id number] DOB: [birth year] Today's Date: 11/24/2015   History of Present Illness  Pt is an 81 y/o female here with sepsis, she has pancreatic cancer and is currently undergoing radiation treatments. She has been feeling weak, finally feeling a little stronger and agrees to PT exam on 4/8.   Clinical Impression  Pt willing to participate with PT but is limited with what she can do.  She reports that normally she is able to put on socks, do light activities around the house with QD caregiver, but is very weak at this time.  Pt shows good effort with PT exam, but is not able to take more than a few small steps from bed to recliner.  Pt will need rehab on d/c before being able to safely return home.   Follow Up Recommendations SNF    Equipment Recommendations       Recommendations for Other Services       Precautions / Restrictions Precautions Precautions: Fall Restrictions Weight Bearing Restrictions: No      Mobility  Bed Mobility Overal bed mobility: Needs Assistance Bed Mobility: Supine to Sit     Supine to sit: Mod assist     General bed mobility comments: Pt needs assist moving LEs to EOB, she showed good effort but needed considerable assist.  Transfers Overall transfer level: Needs assistance Equipment used: Rolling walker (2 wheeled) Transfers: Sit to/from Stand Sit to Stand: Min assist         General transfer comment: Pt needs assist with raised bed and min assist to get to standing.  She is able to maintain balance with walker use.   Ambulation/Gait Ambulation/Gait assistance: Min assist;Mod assist Ambulation Distance (Feet): 3 Feet Assistive device: Rolling walker (2 wheeled)       General Gait Details: Pt lacks confidence with ambulation, but was able to take a few small steps from bed to recliner.  She is very fatigued with the effort but was able to safely get to recliner.    Stairs            Wheelchair Mobility    Modified Rankin (Stroke Patients Only)       Balance                                             Pertinent Vitals/Pain Pain Assessment: No/denies pain    Home Living Family/patient expects to be discharged to:: Skilled nursing facility Living Arrangements: Alone                    Prior Function Level of Independence: Independent with assistive device(s)               Hand Dominance        Extremity/Trunk Assessment   Upper Extremity Assessment: Generalized weakness (b/l shoulders lack more than 50 degrees of elevation)           Lower Extremity Assessment: Generalized weakness (grossly 3+/5 t/o )         Communication   Communication: No difficulties  Cognition Arousal/Alertness: Awake/alert Behavior During Therapy: WFL for tasks assessed/performed Overall Cognitive Status: Within Functional Limits for tasks assessed                      General Comments  Exercises        Assessment/Plan    PT Assessment Patient needs continued PT services  PT Diagnosis Difficulty walking;Generalized weakness   PT Problem List Decreased strength;Decreased balance;Decreased mobility;Decreased safety awareness;Decreased activity tolerance;Decreased range of motion;Decreased knowledge of use of DME  PT Treatment Interventions DME instruction;Gait training;Stair training;Functional mobility training;Therapeutic activities;Therapeutic exercise;Balance training;Neuromuscular re-education;Patient/family education   PT Goals (Current goals can be found in the Care Plan section) Acute Rehab PT Goals Patient Stated Goal: Pt wanting to build up her strength PT Goal Formulation: With patient Time For Goal Achievement: 12/08/15 Potential to Achieve Goals: Fair    Frequency Min 2X/week   Barriers to discharge        Co-evaluation               End of Session Equipment  Utilized During Treatment: Gait belt Activity Tolerance: Patient limited by fatigue Patient left: with call bell/phone within reach;in chair Nurse Communication: Mobility status         Time: [id number] PT Time Calculation (min) (ACUTE ONLY): 17 min   Charges:   PT Evaluation $PT Eval Low Complexity: 1 Procedure     PT G Codes:       Wayne Both, PT, DPT [phone]  Kreg Shropshire 11/24/2015, 11:21 AM

## 2015-11-24 NOTE — Discharge Instructions (Addendum)
°  DIET:  Cardiac diet and Diabetic diet  DISCHARGE CONDITION:  Stable  ACTIVITY:  Activity as tolerated  OXYGEN:  Home Oxygen: No.   Oxygen Delivery: room air  DISCHARGE LOCATION:  SNF   If you experience worsening of your admission symptoms, develop shortness of breath, life threatening emergency, suicidal or homicidal thoughts you must seek medical attention immediately by calling 911 or calling your MD immediately  if symptoms less severe.  You Must read complete instructions/literature along with all the possible adverse reactions/side effects for all the Medicines you take and that have been prescribed to you. Take any new Medicines after you have completely understood and accpet all the possible adverse reactions/side effects.   Please note  You were cared for by a hospitalist during your hospital stay. If you have any questions about your discharge medications or the care you received while you were in the hospital after you are discharged, you can call the unit and asked to speak with the hospitalist on call if the hospitalist that took care of you is not available. Once you are discharged, your primary care physician will handle any further medical issues. Please note that NO REFILLS for any discharge medications will be authorized once you are discharged, as it is imperative that you return to your primary care physician (or establish a relationship with a primary care physician if you do not have one) for your aftercare needs so that they can reassess your need for medications and monitor your lab values.

## 2015-11-24 NOTE — NC FL2 (Signed)
[place] LEVEL OF CARE SCREENING TOOL     IDENTIFICATION  Patient Name: Pamela Preston Birthdate: [birth year] Sex: female Admission Date (Current Location): 11/19/2015  [place] and [place] Number:  Engineering geologist and Address:  [facility], [address] [place], [place] [address]      Provider Number: [id number]  Attending Physician Name and Address:  Hillary Bow, MD  Relative Name and Phone Number:   Rolly Salter (Sister) [phone])    Current Level of Care: Hospital Recommended Level of Care: [place] Prior Approval Number:    Date Approved/Denied: 11/24/15 PASRR Number:  ([id number])  Discharge Plan: SNF    Current Diagnoses: Patient Active Problem List   Diagnosis Date Noted  . Pressure ulcer 11/21/2015  . Sepsis ([place]) 11/20/2015  . Jaundice 11/20/2015  . Allergic rhinitis 10/16/2015  . Controlled type 2 diabetes mellitus without complication ([place]) [id number]  . Acid reflux 10/16/2015  . Gout 10/16/2015  . H/O malignant neoplasm of breast 10/16/2015  . HLD (hyperlipidemia) 10/16/2015  . BP (high blood pressure) 10/16/2015  . Adult hypothyroidism 10/16/2015  . Anemia, iron deficiency 10/16/2015  . Morbid obesity ([place]) 10/16/2015  . Adiposity 10/16/2015  . Arthritis, degenerative 10/16/2015  . Primary malignant neoplasm of head of pancreas ([place]) 10/11/2015  . Protein-calorie malnutrition, severe 09/02/2015  . Severe protein-calorie malnutrition Altamease O[place]: less than 60% of standard weight) ([place]) 09/02/2015  . Obstructive jaundice 08/31/2015  . Biliary and gallbladder disorder 08/31/2015  . Abnormal loss of weight 07/26/2015  . D (diarrhea) 07/26/2015  . Abnormal weight loss 07/26/2015  . Type 2 diabetes mellitus ([place]) 03/11/2015  . Right hip pain 01/24/2013  . HTN (hypertension) 01/24/2013  . CKD (chronic kidney disease) stage 5, GFR less than 15 ml/min (HCC) 01/24/2013  . Diabetes  mellitus type 2 in obese ([place]) 01/24/2013  . Chronic kidney disease, stage IV (severe) ([place]) 01/24/2013  . Essential (primary) hypertension 01/24/2013  . Arthralgia of hip or thigh 01/24/2013    Orientation RESPIRATION BLADDER Height & Weight     Self, Time, Situation, Place  Normal Incontinent Weight: 257 lb 3.2 oz (116.665 kg) Height:  5\' 8"  (172.7 cm)  BEHAVIORAL SYMPTOMS/MOOD NEUROLOGICAL BOWEL NUTRITION STATUS      Continent Diet (Regular)  AMBULATORY STATUS COMMUNICATION OF NEEDS Skin   Extensive Assist Verbally Other (Comment) (Buttocks upper right stage II partial thickness loss of dermis presenting as a shallow upper open ulcer)                       Personal Care Assistance Level of Assistance  Bathing, Feeding, Dressing Bathing Assistance: Limited assistance Feeding assistance: Independent Dressing Assistance: Limited assistance     Functional Limitations Info  Sight, Hearing, Speech Sight Info: Adequate Hearing Info: Adequate Speech Info: Adequate    SPECIAL CARE FACTORS FREQUENCY  PT (By licensed PT)     PT Frequency:  (5X)              Contractures Contractures Info: Not present    Additional Factors Info  Code Status Code Status Info:  (FULL CODE)             Current Medications (11/24/2015):  This is the current hospital active medication list Current Facility-Administered Medications  Medication Dose Route Frequency Provider Last Rate Last Dose  . acetaminophen (TYLENOL) tablet 650 mg  650 mg Oral Q6H PRN Hillary Bow, MD   650 mg at 11/23/15 0845  .  amLODipine (NORVASC) tablet 10 mg  10 mg Oral Daily Saundra Shelling, MD   10 mg at 11/24/15 0758  . aspirin chewable tablet 81 mg  81 mg Oral Daily Saundra Shelling, MD   81 mg at 11/24/15 0758  . ciprofloxacin (CIPRO) tablet 500 mg  500 mg Oral BID Srikar Sudini, MD      . febuxostat (ULORIC) tablet 80 mg  80 mg Oral Daily Saundra Shelling, MD   80 mg at 11/24/15 0758  . feeding supplement  (GLUCERNA SHAKE) (GLUCERNA SHAKE) liquid 237 mL  237 mL Oral TID BM Epifanio Lesches, MD   237 mL at 11/24/15 1407  . furosemide (LASIX) tablet 20 mg  20 mg Oral Daily Srikar Sudini, MD   20 mg at 11/24/15 1026  . heparin injection 5,000 Units  5,000 Units Subcutaneous 3 times per day Saundra Shelling, MD   5,000 Units at 11/24/15 1407  . insulin aspart (novoLOG) injection 0-5 Units  0-5 Units Subcutaneous QHS Hillary Bow, MD   2 Units at 11/23/15 2130  . insulin aspart (novoLOG) injection 0-9 Units  0-9 Units Subcutaneous TID WC Hillary Bow, MD   5 Units at 11/24/15 1207  . insulin detemir (LEVEMIR) injection 12 Units  12 Units Subcutaneous Daily Hillary Bow, MD   12 Units at 11/24/15 1025  . levothyroxine (SYNTHROID, LEVOTHROID) tablet 75 mcg  75 mcg Oral QAC breakfast Saundra Shelling, MD   75 mcg at 11/24/15 0758  . metoprolol succinate (TOPROL-XL) 24 hr tablet 100 mg  100 mg Oral Daily Saundra Shelling, MD   100 mg at 11/24/15 0758  . morphine 2 MG/ML injection 2 mg  2 mg Intravenous Q4H PRN Pavan Pyreddy, MD      . ondansetron (ZOFRAN) tablet 4 mg  4 mg Oral Q6H PRN Saundra Shelling, MD       Or  . ondansetron (ZOFRAN) injection 4 mg  4 mg Intravenous Q6H PRN Pavan Pyreddy, MD      . oxyCODONE (Oxy IR/ROXICODONE) immediate release tablet 5 mg  5 mg Oral Q6H PRN Srikar Sudini, MD      . pantoprazole (PROTONIX) EC tablet 40 mg  40 mg Oral Daily Saundra Shelling, MD   40 mg at 11/24/15 0758  . sodium chloride flush (NS) 0.9 % injection 10-40 mL  10-40 mL Intracatheter PRN Hillary Bow, MD   10 mL at 11/24/15 1029  . sodium chloride flush (NS) 0.9 % injection 3 mL  3 mL Intravenous Q12H Saundra Shelling, MD   3 mL at 11/22/15 2350   Facility-Administered Medications Ordered in Other Encounters  Medication Dose Route Frequency Provider Last Rate Last Dose  . 0.9 %  sodium chloride infusion   Intravenous Continuous Forest Gleason, MD   Stopped at 11/08/15 1144  . heparin lock flush 100 unit/mL  500 Units  Intracatheter Once PRN Forest Gleason, MD      . sodium chloride flush (NS) 0.9 % injection 10 mL  10 mL Intracatheter PRN Forest Gleason, MD   10 mL at 11/08/15 1052     Discharge Medications: Please see discharge summary for a list of discharge medications.  Relevant Imaging Results:  Relevant Lab Results:   Additional Information  (SSN: )  Micah Flesher, LCSW    Weight: 257 lbs

## 2015-11-24 NOTE — Clinical Social Work Note (Addendum)
Clinical Social Work Assessment  Patient Details  Name: Pamela Preston MRN: [id number] Date of Birth: 1930-04-03  Date of referral:  11/24/15               Reason for consult:  Facility Placement                Permission sought to share information with:    Permission granted to share information::     Name::        Agency::     Relationship::     Contact Information:     Housing/Transportation Living arrangements for the past 2 months:  Single Family Home Source of Information:  Patient Patient Interpreter Needed:  None Criminal Activity/Legal Involvement Pertinent to Current Situation/Hospitalization:  No - Comment as needed Significant Relationships:  Friend, Siblings Lives with:  Self Do you feel safe going back to the place where you live?    Need for family participation in patient care:  No (Coment)  Care giving concerns:  Patient lives in [place] alone   Social Worker assessment / plan:  Holiday representative ([place]) met with patient and friend at bedside. Patient is alert and oriented, lying in bed. CSW introduced self and explained role of CSW department. Patient agreed for CSW to complete assessment with friend Pamela Preston present at bedside.Patient reported that she lives alone at home [address], [place], [place] but has help. Patient reports to having family support her sister Pamela Preston, who she would also like notified once pending d/c. Patient has agreed to [facility] Placement. CSW provided patient with a SNF list. Patient will transport to facility of her choice by EMS.   FL2 faxed  Employment status:  Unemployed Nurse, adult PT Recommendations:  [place] / Referral to community resources:  [place]  Patient/Family's Response to care:  Patient has agreed to [employer]. SNF List provided during assessment.  Patient/Family's Understanding of and Emotional Response to Diagnosis, Current  Treatment, and Prognosis: Patient was pleasant throughout assessment and thanked CSW for visit.   Emotional Assessment Appearance:  Appears stated age Attitude/Demeanor/Rapport:    Affect (typically observed):  Appropriate, Calm, Stable, Pleasant Orientation:  Oriented to  Time, Oriented to Place, Oriented to Self, Oriented to Situation Alcohol / Substance use:    Psych involvement (Current and /or in the community):  No (Comment)  Discharge Needs  Concerns to be addressed:  Home Safety Concerns Readmission within the last 30 days:  Yes Current discharge risk:  Dependent with Mobility Barriers to Discharge:  Continued Medical Work up   [facility]	[place], LCSW 11/24/2015, 2:52 PM

## 2015-11-24 NOTE — Progress Notes (Signed)
[facility] [place] NAME: Kirie Moles    MR#:  [id number]  DATE OF BIRTH:  03/02/1930  SUBJECTIVE:   CHIEF COMPLAINT:   Chief Complaint  Patient presents with  . Weakness   Admitted for weakness and abdominal pain. Found to have fever with elevated liver enzymes and WBC.  Sitting up in a chair today. Afebrile. No abdominal pain.  Worked with physical therapy and SNF recommended.  REVIEW OF SYSTEMS:    Review of Systems  Constitutional: Positive for malaise/fatigue. Negative for fever and chills.  HENT: Negative for sore throat.   Eyes: Negative for blurred vision, double vision and pain.  Respiratory: Negative for cough, hemoptysis, shortness of breath and wheezing.   Cardiovascular: Negative for chest pain, palpitations, orthopnea and leg swelling.  Gastrointestinal: Negative for heartburn, vomiting, abdominal pain, diarrhea and constipation.  Genitourinary: Negative for dysuria and hematuria.  Musculoskeletal: Negative for back pain and joint pain.  Skin: Negative for rash.  Neurological: Positive for weakness. Negative for sensory change, speech change, focal weakness and headaches.  Endo/Heme/Allergies: Does not bruise/bleed easily.  Psychiatric/Behavioral: Negative for depression. The patient is not nervous/anxious.     DRUG ALLERGIES:   Allergies  Allergen Reactions  . Colchicine   . Ketorolac   . Lactase Diarrhea  . Lipitor [Atorvastatin]     VITALS:  Blood pressure 133/51, pulse 79, temperature 97.7 F (36.5 C), temperature source Oral, resp. rate 18, height 5\' 8"  (1.727 m), weight 116.665 kg (257 lb 3.2 oz), SpO2 99 %.  PHYSICAL EXAMINATION:   Physical Exam  GENERAL:  82 y.o.-year-old patient lying in the bed with no acute distress.  EYES: Pupils equal, round, reactive to light and accommodation.  Extraocular muscles intact. Icterus positive HEENT: Head atraumatic, normocephalic. Oropharynx and  nasopharynx clear.  NECK:  Supple, no jugular venous distention. No thyroid enlargement, no tenderness.  LUNGS: Normal breath sounds bilaterally, no wheezing, rales, rhonchi. No use of accessory muscles of respiration.  CARDIOVASCULAR: S1, S2 normal. No murmurs, rubs, or gallops.  ABDOMEN: Soft,  Nondistended. Bowel sounds present. No tenderness EXTREMITIES: No cyanosis, clubbing or edema b/l.    NEUROLOGIC: Cranial nerves II through XII are intact. No focal Motor or sensory deficits b/l.   PSYCHIATRIC: The patient is alert and oriented x 3.  SKIN: No obvious rash, lesion, or ulcer.   LABORATORY PANEL:   CBC  Recent Labs Lab 11/23/15 0517  WBC 9.2  HGB 9.2*  HCT 27.1*  PLT 186   ------------------------------------------------------------------------------------------------------------------ Chemistries   Recent Labs Lab 11/24/15 0448  NA 132*  K 3.3*  CL 108  CO2 19*  GLUCOSE 128*  BUN 20  CREATININE 1.49*  CALCIUM 8.0*  AST 84*  ALT 86*  ALKPHOS 515*  BILITOT 1.3*   ------------------------------------------------------------------------------------------------------------------  Cardiac Enzymes  Recent Labs Lab 11/20/15 1439  TROPONINI 0.11*   ------------------------------------------------------------------------------------------------------------------  RADIOLOGY:  Dg C-arm 1-60 Min-no Report  11/22/2015  CLINICAL DATA: ercp, replace stent C-ARM 1-60 MINUTES Fluoroscopy was utilized by the requesting physician.  No radiographic interpretation.     ASSESSMENT AND PLAN:   * Severe Sepsis due to acute cholangitis causing E coli bacteremia WBC is normal. Afebrile today pansensitive Escherichia coli. Will switch to ciprofloxacin orally from today evening.  * Acute on chronic renal failure due to ATN from sepsis resolved  * Abnormal troponin which could be secondary to demand ischemia and sepsis repeat troponin stable.  Seen by cardiology and no  further investigations recommended.  * Hypokalemia Replace orally.  * Pancreatic cancer Recently getting chemotherapy. Outpatient follow-up with oncology  * Anemia of chronic disease  * Insulin-dependent diabetes mellitus Continue Levemir and sliding scale insulin.  * DVT prophylaxis with heparin  Improving. All the records are reviewed and case discussed with Care Management/Social Workerr. Management plans discussed with the patient, family and they are in agreement.  Patient will wait till Monday for discharge to skilled nursing facility.  CODE STATUS: FULL  DVT Prophylaxis: SCDs  TOTAL TIME TAKING CARE OF THIS PATIENT: 35 minutes.  Discussed with patient and family regarding prognosis, physical therapy evaluation. Also discussed with Education officer, museum. Haynes Dage 50% time spent in coordinating care and discussing with family  Discharge when bed available at skilled nursing facility.  Hillary Bow R M.D on 11/24/2015 at 12:10 PM  Between 7am to 6pm - Pager - [phone]  After 6pm go to [email or web address] - password [email or web address] [facility] [facility]  Office  [phone]  CC: Primary care physician; [name], MD  Note: This dictation was prepared with Dragon dictation along with smaller phrase technology. Any transcriptional errors that result from this process are unintentional.

## 2015-11-25 LAB — BLOOD CULTURE ID PANEL (REFLEXED)
ACINETOBACTER BAUMANNII: NOT DETECTED
CANDIDA PARAPSILOSIS: NOT DETECTED
CARBAPENEM RESISTANCE: NOT DETECTED
Candida albicans: NOT DETECTED
Candida glabrata: NOT DETECTED
Candida krusei: NOT DETECTED
Candida tropicalis: NOT DETECTED
ENTEROBACTERIACEAE SPECIES: DETECTED — AB
ENTEROCOCCUS SPECIES: NOT DETECTED
Enterobacter cloacae complex: NOT DETECTED
Escherichia coli: DETECTED — AB
Haemophilus influenzae: NOT DETECTED
KLEBSIELLA PNEUMONIAE: NOT DETECTED
Klebsiella oxytoca: NOT DETECTED
LISTERIA MONOCYTOGENES: NOT DETECTED
Methicillin resistance: NOT DETECTED
Neisseria meningitidis: NOT DETECTED
PSEUDOMONAS AERUGINOSA: NOT DETECTED
Proteus species: NOT DETECTED
SERRATIA MARCESCENS: NOT DETECTED
STAPHYLOCOCCUS AUREUS BCID: NOT DETECTED
STAPHYLOCOCCUS SPECIES: NOT DETECTED
STREPTOCOCCUS AGALACTIAE: NOT DETECTED
Streptococcus pneumoniae: NOT DETECTED
Streptococcus pyogenes: NOT DETECTED
Streptococcus species: NOT DETECTED
VANCOMYCIN RESISTANCE: NOT DETECTED

## 2015-11-25 LAB — GLUCOSE, CAPILLARY
GLUCOSE-CAPILLARY: 266 mg/dL — AB (ref 65–99)
Glucose-Capillary: 138 mg/dL — ABNORMAL HIGH (ref 65–99)

## 2015-11-25 MED ORDER — HEPARIN SOD (PORK) LOCK FLUSH 100 UNIT/ML IV SOLN
500.0000 [IU] | INTRAVENOUS | Status: AC | PRN
Start: 2015-11-25 — End: 2015-11-25
  Administered 2015-11-25: 15:00:00 500 [IU]
  Filled 2015-11-25: qty 5

## 2015-11-25 NOTE — Progress Notes (Signed)
Pharmacy note:  Lab called [facility] positive blood culture report of E. coli, no KPC detected. Blood cultures from 4/4 grew E. coli S to cephalosporin and quinolones. Patient has been de-escalated from meropenem >> Rocephin>> PO Cipro per attending for E.coli bacteremia arising from cholangitis.. Still afebrile and WBC is normalized. No new recommendation based on this information.  Sim Boast, PharmD, BCPS  11/25/2015

## 2015-11-25 NOTE — Progress Notes (Signed)
Pt is being discharged to [facility]. Report given to Maudie Mercury, LPN. Discharge packet in pt's slot. Awaiting EMS.

## 2015-11-25 NOTE — Progress Notes (Signed)
Patient will d/c to SNF [facility], per patient request. CSW notified [place] at [employer]. CSW faxed d/c orders to [phone] per [facility] request. [facility] John confirmed to receiving discharge orders and has agreed to weekend admission. Nurse was notified that packet was ready and placed next to patient chart. CSW  Placed prescriptions in packet along with d/c summary and FL2. Nurse was given number for report [phone]) [phone]. Room number is 809.  Patient is alert and oriented. Patient reports to CSW, she will notify her sister once she comes from church. Patient agreeable to d/c to [facility] by EMS.      Pamela Preston, BSW, MSW, [employer] Work Dept [phone]

## 2015-11-25 NOTE — Clinical Social Work Placement (Signed)
   CLINICAL SOCIAL WORK PLACEMENT  NOTE  Date:  11/25/2015  Patient Details  Name: Pamela Preston MRN: [id number] Date of Birth: [birth year]  Clinical Social Work is seeking post-discharge placement for this patient at the [place] level of care (*CSW will initial, date and re-position this form in  chart as items are completed):  Yes   Patient/family provided with [place] Work Department's list of facilities offering this level of care within the geographic area requested by the patient (or if unable, by the patient's family).  Yes   Patient/family informed of their freedom to choose among providers that offer the needed level of care, that participate in Medicare, Medicaid or managed care program needed by the patient, have an available bed and are willing to accept the patient.      Patient/family informed of [facility]'s ownership interest in [facility] and [facility], as well as of the fact that they are under no obligation to receive care at these facilities.  PASRR submitted to EDS on       PASRR number received on 11/25/15     Existing PASRR number confirmed on       FL2 transmitted to all facilities in geographic area requested by pt/family on 11/25/15     FL2 transmitted to all facilities within larger geographic area on       Patient informed that his/her managed care company has contracts with or will negotiate with certain facilities, including the following:        Yes   Patient/family informed of bed offers received.  Patient [facility] bed at  [facility])     Physician recommends and patient chooses bed at      Patient to be transferred to  [facility]) on 11/25/15.  Patient to be transferred to facility by  (EMS)     Patient family notified on 11/25/15 of transfer.  Name of family member notified:   Rolly Salter)     PHYSICIAN       Additional Comment:     _______________________________________________ Micah Flesher, LCSW 11/25/2015, 11:08 AM

## 2015-11-26 ENCOUNTER — Ambulatory Visit: Payer: Medicare Other

## 2015-11-26 LAB — CULTURE, BLOOD (ROUTINE X 2)

## 2015-11-27 ENCOUNTER — Ambulatory Visit: Payer: Medicare Other

## 2015-11-27 ENCOUNTER — Encounter: Payer: Self-pay | Admitting: Gastroenterology

## 2015-11-28 ENCOUNTER — Ambulatory Visit
Admission: RE | Admit: 2015-11-28 | Discharge: 2015-11-28 | Disposition: A | Discharge status: Home / Self Care | Payer: Medicare Other | Source: Ambulatory Visit | Attending: Radiation Oncology | Admitting: Radiation Oncology

## 2015-11-28 DIAGNOSIS — Z510 Encounter for antineoplastic radiation therapy: Secondary | ICD-10-CM | POA: Diagnosis not present

## 2015-11-28 DIAGNOSIS — C250 Malignant neoplasm of head of pancreas: Secondary | ICD-10-CM | POA: Diagnosis not present

## 2015-11-29 ENCOUNTER — Other Ambulatory Visit: Payer: Self-pay | Admitting: Internal Medicine

## 2015-11-29 ENCOUNTER — Inpatient Hospital Stay (HOSPITAL_BASED_OUTPATIENT_CLINIC_OR_DEPARTMENT_OTHER): Payer: Medicare Other | Admitting: Internal Medicine

## 2015-11-29 ENCOUNTER — Inpatient Hospital Stay: Payer: Medicare Other | Attending: Internal Medicine | Admitting: *Deleted

## 2015-11-29 ENCOUNTER — Ambulatory Visit
Admission: RE | Admit: 2015-11-29 | Discharge: 2015-11-29 | Disposition: A | Discharge status: Home / Self Care | Payer: Medicare Other | Source: Ambulatory Visit | Attending: Radiation Oncology | Admitting: Radiation Oncology

## 2015-11-29 ENCOUNTER — Inpatient Hospital Stay: Payer: Medicare Other

## 2015-11-29 VITALS — BP 126/61 | HR 65 | Temp 96.9°F | Resp 18 | Wt 262.0 lb

## 2015-11-29 DIAGNOSIS — K219 Gastro-esophageal reflux disease without esophagitis: Secondary | ICD-10-CM | POA: Diagnosis not present

## 2015-11-29 DIAGNOSIS — R740 Nonspecific elevation of levels of transaminase and lactic acid dehydrogenase [LDH]: Secondary | ICD-10-CM

## 2015-11-29 DIAGNOSIS — N189 Chronic kidney disease, unspecified: Secondary | ICD-10-CM | POA: Insufficient documentation

## 2015-11-29 DIAGNOSIS — K830 Cholangitis: Secondary | ICD-10-CM | POA: Insufficient documentation

## 2015-11-29 DIAGNOSIS — Z7982 Long term (current) use of aspirin: Secondary | ICD-10-CM | POA: Diagnosis not present

## 2015-11-29 DIAGNOSIS — Z853 Personal history of malignant neoplasm of breast: Secondary | ICD-10-CM | POA: Insufficient documentation

## 2015-11-29 DIAGNOSIS — E669 Obesity, unspecified: Secondary | ICD-10-CM | POA: Insufficient documentation

## 2015-11-29 DIAGNOSIS — Z7984 Long term (current) use of oral hypoglycemic drugs: Secondary | ICD-10-CM | POA: Diagnosis not present

## 2015-11-29 DIAGNOSIS — Z79811 Long term (current) use of aromatase inhibitors: Secondary | ICD-10-CM | POA: Insufficient documentation

## 2015-11-29 DIAGNOSIS — C250 Malignant neoplasm of head of pancreas: Secondary | ICD-10-CM | POA: Diagnosis present

## 2015-11-29 DIAGNOSIS — R630 Anorexia: Secondary | ICD-10-CM | POA: Insufficient documentation

## 2015-11-29 DIAGNOSIS — Z794 Long term (current) use of insulin: Secondary | ICD-10-CM | POA: Diagnosis not present

## 2015-11-29 DIAGNOSIS — I129 Hypertensive chronic kidney disease with stage 1 through stage 4 chronic kidney disease, or unspecified chronic kidney disease: Secondary | ICD-10-CM

## 2015-11-29 DIAGNOSIS — Z809 Family history of malignant neoplasm, unspecified: Secondary | ICD-10-CM | POA: Insufficient documentation

## 2015-11-29 DIAGNOSIS — Z5111 Encounter for antineoplastic chemotherapy: Secondary | ICD-10-CM | POA: Diagnosis not present

## 2015-11-29 DIAGNOSIS — E119 Type 2 diabetes mellitus without complications: Secondary | ICD-10-CM

## 2015-11-29 DIAGNOSIS — Z79899 Other long term (current) drug therapy: Secondary | ICD-10-CM | POA: Insufficient documentation

## 2015-11-29 DIAGNOSIS — Z8542 Personal history of malignant neoplasm of other parts of uterus: Secondary | ICD-10-CM | POA: Insufficient documentation

## 2015-11-29 LAB — CBC WITH DIFFERENTIAL/PLATELET
BASOS PCT: 2 %
Basophils Absolute: 0.1 10*3/uL (ref 0–0.1)
EOS ABS: 0.2 10*3/uL (ref 0–0.7)
EOS PCT: 3 %
HCT: 28.5 % — ABNORMAL LOW (ref 35.0–47.0)
Hemoglobin: 9.6 g/dL — ABNORMAL LOW (ref 12.0–16.0)
LYMPHS ABS: 1 10*3/uL (ref 1.0–3.6)
Lymphocytes Relative: 16 %
MCH: 30.2 pg (ref 26.0–34.0)
MCHC: 33.8 g/dL (ref 32.0–36.0)
MCV: 89.1 fL (ref 80.0–100.0)
MONO ABS: 0.6 10*3/uL (ref 0.2–0.9)
MONOS PCT: 9 %
Neutro Abs: 4.7 10*3/uL (ref 1.4–6.5)
Neutrophils Relative %: 70 %
PLATELETS: 402 10*3/uL (ref 150–440)
RBC: 3.2 MIL/uL — ABNORMAL LOW (ref 3.80–5.20)
RDW: 17.3 % — ABNORMAL HIGH (ref 11.5–14.5)
WBC: 6.6 10*3/uL (ref 3.6–11.0)

## 2015-11-29 LAB — COMPREHENSIVE METABOLIC PANEL
ALT: 67 U/L — AB (ref 14–54)
AST: 60 U/L — AB (ref 15–41)
Albumin: 3 g/dL — ABNORMAL LOW (ref 3.5–5.0)
Alkaline Phosphatase: 513 U/L — ABNORMAL HIGH (ref 38–126)
Anion gap: 5 (ref 5–15)
BUN: 22 mg/dL — ABNORMAL HIGH (ref 6–20)
CHLORIDE: 107 mmol/L (ref 101–111)
CO2: 23 mmol/L (ref 22–32)
CREATININE: 2.32 mg/dL — AB (ref 0.44–1.00)
Calcium: 8.6 mg/dL — ABNORMAL LOW (ref 8.9–10.3)
GFR, EST AFRICAN AMERICAN: 21 mL/min — AB (ref >60)
GFR, EST NON AFRICAN AMERICAN: 18 mL/min — AB (ref >60)
Glucose, Bld: 184 mg/dL — ABNORMAL HIGH (ref 65–99)
POTASSIUM: 4.7 mmol/L (ref 3.5–5.1)
SODIUM: 135 mmol/L (ref 135–145)
Total Bilirubin: 1.2 mg/dL (ref 0.3–1.2)
Total Protein: 6.5 g/dL (ref 6.5–8.1)

## 2015-11-29 LAB — CULTURE, BLOOD (ROUTINE X 2)
CULTURE: NO GROWTH
Culture: NO GROWTH

## 2015-11-29 MED ORDER — SODIUM CHLORIDE 0.9 % IV SOLN
225.0000 mg/m2/d | INTRAVENOUS | Status: DC
  Administered 2015-11-29: 2700 mg via INTRAVENOUS
  Filled 2015-11-29: qty 54

## 2015-11-29 MED ORDER — SODIUM CHLORIDE 0.9% FLUSH
10.0000 mL | INTRAVENOUS | Status: DC | PRN
  Administered 2015-11-29: 10 mL
  Filled 2015-11-29: qty 10

## 2015-11-29 NOTE — Progress Notes (Signed)
[name] OFFICE PROGRESS NOTE  Patient Care Team: [name], MD as PCP - General (Physician Assistant) [name], RN as Registered Nurse   SUMMARY OF ONCOLOGIC HISTORY: # FEB 2017- Carcinoma of head of pancreas; STAGE IB; T2N0 [EUS]- 23 mm millimeter by 17 mm invading superior MESENTERIC VEIN. No arterial invasion was found.  November 08, 2015- 5-FU by continuous infusion and radiation therapy [6 weeks]  # Cholangitis/sepsis [april 2017] s/p stent exchange [Ddr.Oh]  # Carcinoma of breast diagnosis was in August 25 2011. T1 cN0 M0 tumor ER/PR positive.  # CKD/ diabetes,/ obesity bilateral knee replacement and poor performance status   INTERVAL HISTORY:  A very pleasant 82 year old female patient with above history of stage IB pancreatic cancer currently on concurrent chemoradiation is here for follow-up. Currently status post 2 cycles of chemotherapy with radiation.  In the interim patient had been admitted to the hospital for cholangitis/sepsis; had a stent exchanged. Currently on ciprofloxacin. I reviewed the hospitalization notes in detail.  No fever no chills. Feels weak. She is in a rehabilitation. She is working with physical therapy. She continues to walk with a walker. Denies any nausea vomiting. Denies any diarrhea. Denies any sores in the mouth. No rash on Palms and soles.   She is a poor appetite. No swelling in the legs or arms.  REVIEW OF SYSTEMS:  A complete 10 point review of system is done which is negative except mentioned above/history of present illness.   PAST MEDICAL HISTORY :  Past Medical History  Diagnosis Date  . Hypertension   . GERD (gastroesophageal reflux disease)   . Diabetes mellitus without complication ([place])   . Breast cancer ([place]) 2013    c Lumpectomy 2013  . Uterine cancer ([place]) 1960  . Pancreatic cancer ([place])   . Abnormal LFTs     PAST SURGICAL HISTORY :   Past Surgical History  Procedure Laterality  Date  . Abdominal hysterectomy    . Knee arthroscopy    . Ercp N/A 09/04/2015    Procedure: ENDOSCOPIC RETROGRADE CHOLANGIOPANCREATOGRAPHY (ERCP);  Surgeon: Hulen Luster, MD;  Location: [facility] ENDOSCOPY;  Service: Gastroenterology;  Laterality: N/A;  . Breast biopsy Right 2012    Positive  . Upper esophageal endoscopic ultrasound (eus) N/A 10/04/2015    Procedure: UPPER ESOPHAGEAL ENDOSCOPIC ULTRASOUND (EUS);  Surgeon: Holly Bodily, MD;  Location: [facility] ENDOSCOPY;  Service: Gastroenterology;  Laterality: N/A;  . Peripheral vascular catheterization N/A 11/05/2015    Procedure: Porta Cath Insertion;  Surgeon: Algernon Huxley, MD;  Location: [place] CV LAB;  Service: Cardiovascular;  Laterality: N/A;  . Ercp N/A 11/22/2015    Procedure: ENDOSCOPIC RETROGRADE CHOLANGIOPANCREATOGRAPHY (ERCP);  Surgeon: Hulen Luster, MD;  Location: [facility] ENDOSCOPY;  Service: Endoscopy;  Laterality: N/A;  For Thursday afternoon    FAMILY HISTORY :   Family History  Problem Relation Age of Onset  . Cancer Mother   . Heart disease Brother     SOCIAL HISTORY:   Social History  Substance Use Topics  . Smoking status: Never Smoker   . Smokeless tobacco: Not on file  . Alcohol Use: No    ALLERGIES:  is allergic to colchicine; ketorolac; lactase; and lipitor.  MEDICATIONS:  Current Outpatient Prescriptions  Medication Sig Dispense Refill  . amLODipine (NORVASC) 10 MG tablet Take 10 mg by mouth daily.    [name] aspirin 81 MG tablet Take 81 mg by mouth daily.    [name] buPROPion [facility]  SR) 150 MG 12 hr tablet Take 150 mg by mouth 2 (two) times daily.    . ciprofloxacin (CIPRO) 500 MG tablet Take 1 tablet (500 mg total) by mouth 2 (two) times daily. 22 tablet 0  . febuxostat (ULORIC) 40 MG tablet Take 80 mg by mouth daily.    . furosemide (LASIX) 20 MG tablet Take 20 mg by mouth daily.    [name] glipiZIDE (GLUCOTROL) 5 MG tablet Take 5 mg by mouth daily before breakfast.     . HYDROcodone-acetaminophen (NORCO/VICODIN) 5-325  MG tablet Take 1 tablet by mouth every 6 (six) hours as needed for moderate pain. 20 tablet 0  . insulin glargine (LANTUS) 100 UNIT/ML injection Inject 0.14 mLs (14 Units total) into the skin 2 (two) times daily. 10 mL 0  . letrozole (FEMARA) 2.5 MG tablet Take 2.5 mg by mouth daily.    [name] levothyroxine (SYNTHROID, LEVOTHROID) 75 MCG tablet Take 75 mcg by mouth daily before breakfast.    . lisinopril (PRINIVIL,ZESTRIL) 40 MG tablet Take 40 mg by mouth daily.    [name] loperamide (IMODIUM) 2 MG capsule TK 1 C PO Q 6 H PRN FOR DIARRHEA OR LOOSE STOOLS  0  . metoprolol succinate (TOPROL-XL) 100 MG 24 hr tablet Take 100 mg by mouth daily. Take with or immediately following a meal.    . omeprazole (PRILOSEC) 20 MG capsule Take 20 mg by mouth daily.    . potassium chloride 20 MEQ TBCR Take 20 mEq by mouth 2 (two) times daily. 30 tablet 0  . pravastatin (PRAVACHOL) 40 MG tablet Take 40 mg by mouth daily.    . promethazine (PHENERGAN) 25 MG tablet   1   No current facility-administered medications for this visit.   Facility-Administered Medications Ordered in Other Visits  Medication Dose Route Frequency Provider Last Rate Last Dose  . 0.9 %  sodium chloride infusion   Intravenous Continuous Forest Gleason, MD   Stopped at 11/08/15 1144  . heparin lock flush 100 unit/mL  500 Units Intracatheter Once PRN Forest Gleason, MD      . sodium chloride flush (NS) 0.9 % injection 10 mL  10 mL Intracatheter PRN Forest Gleason, MD   10 mL at 11/08/15 1052    PHYSICAL EXAMINATION: ECOG PERFORMANCE STATUS: 3 - Symptomatic, >50% confined to bed  BP 126/61 mmHg  Pulse 65  Temp(Src) 96.9 F (36.1 C) (Tympanic)  Resp 18  Wt 262 lb (118.842 kg)  Filed Weights   11/29/15 0838  Weight: 262 lb (118.842 kg)    GENERAL: Well-nourished well-developed; Obese Alert, no distress and comfortable.   With her sister; in a wheel chair.  EYES: no pallor or icterus OROPHARYNX: no thrush or ulceration; good dentition  NECK:  supple, no masses felt LYMPH:  no palpable lymphadenopathy in the cervical, axillary or inguinal regions LUNGS: clear to auscultation and  No wheeze or crackles HEART/CVS: regular rate & rhythm and no murmurs; positive for bil Lower extremity edema ABDOMEN:abdomen soft, non-tender and normal bowel sounds Musculoskeletal:no cyanosis of digits and no clubbing  PSYCH: alert & oriented x 3 with fluent speech NEURO: no focal motor/sensory deficits SKIN:  no rashes or significant lesions  LABORATORY DATA:  I have reviewed the data as listed    Component Value Date/Time   NA 132* 11/24/2015 0448   NA 138 11/19/2013 0822   K 3.3* 11/24/2015 0448   K 3.8 11/19/2013 0822   CL 108 11/24/2015 0448   CL 103 11/19/2013  0822   CO2 19* 11/24/2015 0448   CO2 30 11/19/2013 0822   GLUCOSE 128* 11/24/2015 0448   GLUCOSE 126* 11/19/2013 0822   BUN 20 11/24/2015 0448   BUN 18 11/19/2013 0822   CREATININE 1.49* 11/24/2015 0448   CREATININE 2.31* 11/19/2013 0822   CALCIUM 8.0* 11/24/2015 0448   CALCIUM 8.7 11/19/2013 0822   PROT 5.1* 11/24/2015 0448   PROT 7.4 11/19/2013 0822   ALBUMIN 1.9* 11/24/2015 0448   ALBUMIN 3.3* 11/19/2013 0822   AST 84* 11/24/2015 0448   AST 16 11/19/2013 0822   ALT 86* 11/24/2015 0448   ALT 13 11/19/2013 0822   ALKPHOS 515* 11/24/2015 0448   ALKPHOS 94 11/19/2013 0822   BILITOT 1.3* 11/24/2015 0448   BILITOT 0.4 11/19/2013 0822   GFRNONAA 31* 11/24/2015 0448   GFRNONAA 19* 11/19/2013 0822   GFRAA 36* 11/24/2015 0448   GFRAA 22* 11/19/2013 0822    No results found for: SPEP, UPEP  Lab Results  Component Value Date   WBC 6.6 11/29/2015   NEUTROABS 4.7 11/29/2015   HGB 9.6* 11/29/2015   HCT 28.5* 11/29/2015   MCV 89.1 11/29/2015   PLT 402 11/29/2015      Chemistry      Component Value Date/Time   NA 132* 11/24/2015 0448   NA 138 11/19/2013 0822   K 3.3* 11/24/2015 0448   K 3.8 11/19/2013 0822   CL 108 11/24/2015 0448   CL 103 11/19/2013 0822   CO2  19* 11/24/2015 0448   CO2 30 11/19/2013 0822   BUN 20 11/24/2015 0448   BUN 18 11/19/2013 0822   CREATININE 1.49* 11/24/2015 0448   CREATININE 2.31* 11/19/2013 0822      Component Value Date/Time   CALCIUM 8.0* 11/24/2015 0448   CALCIUM 8.7 11/19/2013 0822   ALKPHOS 515* 11/24/2015 0448   ALKPHOS 94 11/19/2013 0822   AST 84* 11/24/2015 0448   AST 16 11/19/2013 0822   ALT 86* 11/24/2015 0448   ALT 13 11/19/2013 0822   BILITOT 1.3* 11/24/2015 0448   BILITOT 0.4 11/19/2013 0822      ASSESSMENT & PLAN:   # PANCREATIC Adenocarcinoma- T2N0-STAGE IB currently on concurrent chemoradiation with 5-FU. Patient not a candidate for surgery/or aggressive treatments. Currently status post 2 cycles of chemotherapy with radiation.  # Plan to restart radiation/chemotherapy today; white count is 6.6 hemoglobin 9.6 platelets 402. Proceed with 5-FU pump today.    # Chronic kidney disease- today creatinine is 2.32/recommend more fluid intake. Recommend cutting down the dose of lisinopril to 20 mg instead of 40mg /day.   # Cholangitis/bacteremia- status post stent exchange. Currently improved. On antibiotics. LFTs slightly elevated AST 60 ALT 67 alkaline phosphatase 513 total bilirubin normal at 1.2.  # overall prognosis is poor. Patient will follow-up with Dr. Oliva Bustard in 1 week/5-FU pump/labs.     Cammie Sickle, MD 11/29/2015 8:44 AM

## 2015-11-29 NOTE — Progress Notes (Signed)
Patient here today as hospital follow up. Discharged on Sunday. Wants to discuss metal stint placement.

## 2015-11-30 ENCOUNTER — Ambulatory Visit
Admission: RE | Admit: 2015-11-30 | Discharge: 2015-11-30 | Disposition: A | Discharge status: Home / Self Care | Payer: Medicare Other | Source: Ambulatory Visit | Attending: Radiation Oncology | Admitting: Radiation Oncology

## 2015-11-30 DIAGNOSIS — C250 Malignant neoplasm of head of pancreas: Secondary | ICD-10-CM | POA: Diagnosis not present

## 2015-12-03 ENCOUNTER — Ambulatory Visit
Admission: RE | Admit: 2015-12-03 | Discharge: 2015-12-03 | Disposition: A | Discharge status: Home / Self Care | Payer: Medicare Other | Source: Ambulatory Visit | Attending: Radiation Oncology | Admitting: Radiation Oncology

## 2015-12-03 DIAGNOSIS — C250 Malignant neoplasm of head of pancreas: Secondary | ICD-10-CM | POA: Diagnosis not present

## 2015-12-04 ENCOUNTER — Ambulatory Visit: Payer: Medicare Other

## 2015-12-05 ENCOUNTER — Ambulatory Visit
Admission: RE | Admit: 2015-12-05 | Discharge: 2015-12-05 | Disposition: A | Discharge status: Home / Self Care | Payer: Medicare Other | Source: Ambulatory Visit | Attending: Radiation Oncology | Admitting: Radiation Oncology

## 2015-12-05 DIAGNOSIS — C250 Malignant neoplasm of head of pancreas: Secondary | ICD-10-CM | POA: Diagnosis not present

## 2015-12-06 ENCOUNTER — Ambulatory Visit
Admission: RE | Admit: 2015-12-06 | Discharge: 2015-12-06 | Disposition: A | Discharge status: Home / Self Care | Payer: Medicare Other | Source: Ambulatory Visit | Attending: Radiation Oncology | Admitting: Radiation Oncology

## 2015-12-06 ENCOUNTER — Inpatient Hospital Stay: Payer: Medicare Other

## 2015-12-06 ENCOUNTER — Inpatient Hospital Stay (HOSPITAL_BASED_OUTPATIENT_CLINIC_OR_DEPARTMENT_OTHER): Payer: Medicare Other | Admitting: Oncology

## 2015-12-06 VITALS — BP 105/68 | HR 64 | Temp 95.5°F | Resp 18 | Wt 252.9 lb

## 2015-12-06 DIAGNOSIS — E119 Type 2 diabetes mellitus without complications: Secondary | ICD-10-CM

## 2015-12-06 DIAGNOSIS — K830 Cholangitis: Secondary | ICD-10-CM | POA: Diagnosis not present

## 2015-12-06 DIAGNOSIS — E669 Obesity, unspecified: Secondary | ICD-10-CM

## 2015-12-06 DIAGNOSIS — I129 Hypertensive chronic kidney disease with stage 1 through stage 4 chronic kidney disease, or unspecified chronic kidney disease: Secondary | ICD-10-CM | POA: Diagnosis not present

## 2015-12-06 DIAGNOSIS — C250 Malignant neoplasm of head of pancreas: Secondary | ICD-10-CM

## 2015-12-06 DIAGNOSIS — Z79811 Long term (current) use of aromatase inhibitors: Secondary | ICD-10-CM

## 2015-12-06 DIAGNOSIS — Z7984 Long term (current) use of oral hypoglycemic drugs: Secondary | ICD-10-CM

## 2015-12-06 DIAGNOSIS — N189 Chronic kidney disease, unspecified: Secondary | ICD-10-CM

## 2015-12-06 DIAGNOSIS — Z7982 Long term (current) use of aspirin: Secondary | ICD-10-CM

## 2015-12-06 DIAGNOSIS — Z809 Family history of malignant neoplasm, unspecified: Secondary | ICD-10-CM

## 2015-12-06 DIAGNOSIS — K219 Gastro-esophageal reflux disease without esophagitis: Secondary | ICD-10-CM

## 2015-12-06 DIAGNOSIS — Z794 Long term (current) use of insulin: Secondary | ICD-10-CM

## 2015-12-06 DIAGNOSIS — R740 Nonspecific elevation of levels of transaminase and lactic acid dehydrogenase [LDH]: Secondary | ICD-10-CM

## 2015-12-06 DIAGNOSIS — C801 Malignant (primary) neoplasm, unspecified: Secondary | ICD-10-CM

## 2015-12-06 DIAGNOSIS — Z79899 Other long term (current) drug therapy: Secondary | ICD-10-CM

## 2015-12-06 DIAGNOSIS — Z8542 Personal history of malignant neoplasm of other parts of uterus: Secondary | ICD-10-CM

## 2015-12-06 DIAGNOSIS — R630 Anorexia: Secondary | ICD-10-CM

## 2015-12-06 DIAGNOSIS — Z853 Personal history of malignant neoplasm of breast: Secondary | ICD-10-CM

## 2015-12-06 LAB — COMPREHENSIVE METABOLIC PANEL
ALBUMIN: 3.2 g/dL — AB (ref 3.5–5.0)
ALT: 28 U/L (ref 14–54)
ANION GAP: 10 (ref 5–15)
AST: 32 U/L (ref 15–41)
Alkaline Phosphatase: 341 U/L — ABNORMAL HIGH (ref 38–126)
BILIRUBIN TOTAL: 1.1 mg/dL (ref 0.3–1.2)
BUN: 29 mg/dL — ABNORMAL HIGH (ref 6–20)
CO2: 21 mmol/L — AB (ref 22–32)
Calcium: 8.8 mg/dL — ABNORMAL LOW (ref 8.9–10.3)
Chloride: 107 mmol/L (ref 101–111)
Creatinine, Ser: 3.73 mg/dL — ABNORMAL HIGH (ref 0.44–1.00)
GFR calc non Af Amer: 10 mL/min — ABNORMAL LOW (ref >60)
GFR, EST AFRICAN AMERICAN: 12 mL/min — AB (ref >60)
GLUCOSE: 232 mg/dL — AB (ref 65–99)
POTASSIUM: 5.4 mmol/L — AB (ref 3.5–5.1)
SODIUM: 138 mmol/L (ref 135–145)
TOTAL PROTEIN: 6.8 g/dL (ref 6.5–8.1)

## 2015-12-06 LAB — CBC WITH DIFFERENTIAL/PLATELET
BASOS PCT: 2 %
Basophils Absolute: 0.1 10*3/uL (ref 0–0.1)
EOS ABS: 0.2 10*3/uL (ref 0–0.7)
Eosinophils Relative: 3 %
HCT: 29.8 % — ABNORMAL LOW (ref 35.0–47.0)
Hemoglobin: 10.1 g/dL — ABNORMAL LOW (ref 12.0–16.0)
Lymphocytes Relative: 12 %
Lymphs Abs: 0.7 10*3/uL — ABNORMAL LOW (ref 1.0–3.6)
MCH: 30.9 pg (ref 26.0–34.0)
MCHC: 33.9 g/dL (ref 32.0–36.0)
MCV: 91.1 fL (ref 80.0–100.0)
MONO ABS: 0.5 10*3/uL (ref 0.2–0.9)
MONOS PCT: 9 %
Neutro Abs: 4.2 10*3/uL (ref 1.4–6.5)
Neutrophils Relative %: 74 %
Platelets: 316 10*3/uL (ref 150–440)
RBC: 3.27 MIL/uL — ABNORMAL LOW (ref 3.80–5.20)
RDW: 17.7 % — AB (ref 11.5–14.5)
WBC: 5.7 10*3/uL (ref 3.6–11.0)

## 2015-12-06 MED ORDER — HEPARIN SOD (PORK) LOCK FLUSH 100 UNIT/ML IV SOLN
500.0000 [IU] | Freq: Once | INTRAVENOUS | Status: DC | PRN
Start: 2015-12-06 — End: 2015-12-06

## 2015-12-06 MED ORDER — HEPARIN SOD (PORK) LOCK FLUSH 100 UNIT/ML IV SOLN
INTRAVENOUS | Status: AC
  Filled 2015-12-06: qty 5

## 2015-12-06 MED ORDER — SODIUM CHLORIDE 0.9 % IV SOLN
225.0000 mg/m2/d | INTRAVENOUS | Status: DC
  Filled 2015-12-06: qty 54

## 2015-12-06 MED ORDER — SODIUM CHLORIDE 0.9% FLUSH
10.0000 mL | Freq: Once | INTRAVENOUS | Status: AC
  Administered 2015-12-06: 10 mL via INTRAVENOUS
  Filled 2015-12-06: qty 10

## 2015-12-06 MED ORDER — SODIUM CHLORIDE 0.9% FLUSH
10.0000 mL | INTRAVENOUS | Status: DC | PRN
  Administered 2015-12-06: 10 mL via INTRAVENOUS
  Filled 2015-12-06: qty 10

## 2015-12-06 MED ORDER — HEPARIN SOD (PORK) LOCK FLUSH 100 UNIT/ML IV SOLN
500.0000 [IU] | Freq: Once | INTRAVENOUS | Status: AC
  Administered 2015-12-06: 500 [IU] via INTRAVENOUS

## 2015-12-06 MED ORDER — SODIUM CHLORIDE 0.9% FLUSH
10.0000 mL | INTRAVENOUS | Status: DC | PRN
  Filled 2015-12-06: qty 10

## 2015-12-06 MED ORDER — SODIUM CHLORIDE 0.9 % IV SOLN
2700.0000 mg | INTRAVENOUS | Status: DC
  Administered 2015-12-06: 2700 mg via INTRAVENOUS
  Filled 2015-12-06: qty 54

## 2015-12-06 NOTE — Progress Notes (Signed)
Pamela Preston @ [facility] [phone]  Fax:(336) [place] OB: [birth year]  MR#: [id number]  XNA#:[id number]  Patient Care Team: [name], MD as PCP - General (Physician Assistant) [name], RN as Registered Nurse  CHIEF COMPLAINT:  Chief Complaint  Patient presents with  . Pancreatic Cancer  1.  Carcinoma of head of pancreas 23 mm millimeter by 17 mm invading superior  MESENTERIC VEIN.  No arterial invasion was found. T2 N0 M0 tumor by EUS criteria.  Biopsy was positive for adenocarcinoma. (February, 2017). 2.  Carcinoma of breast diagnosis was in August 25 2011.  T1 cN0 M0 tumor ER/PR positive. 3.  Multiple comorbid condition with chronic renal failure, diabetes, high blood pressure, obesity bilateral knee replacement and poor performance status 4.We will start patient on 5-FU by continuous infusion and radiation therapy (November 08, 2015) and    No history exists.    Oncology Flowsheet 09/04/2015 09/05/2015 09/06/2015 11/08/2015 11/15/2015 11/22/2015 11/29/2015  Day, Cycle - - - Day 1, Cycle 1 Day 1, Cycle 2 - Day 1, Cycle 3  fluorouracil (ADRUCIL) IV - - - 225 mg/m2/day 2,700 mg - 225 mg/m2/day  letrozole (FEMARA) PO 2.5 mg 2.5 mg 2.5 mg - - - -  ondansetron (ZOFRAN) IJ - - - - - - -  ondansetron (ZOFRAN) IV - 4 mg - - - - -  ondansetron (ZOFRAN) PO 4 mg - - - - - -    INTERVAL HISTORY: 82 year old lady who is in wheelchair.  Patient was admitted in hospital for abdominal pain.  Nausea vomiting diarrhea underwent extensive workup included a noncontrast CT scan which revealed pancreatic mass.  Patient underwent EUS which was positive for adenocarcinoma and was stage TII N0 M0 tumor patient is here for further follow-up and consideration of treatment.  No nausea no vomiting.  Diarrhea has improved.  Patient has multiple comorbid condition.  As mentioned above [place] bilateral knee replacement, moderate obesity, in wheelchair walking with the help  of walker.  Has a chronic renal failure, high blood pressure, diabetes, Patient could not get MRI scan because of significant renal insufficiency. Has been evaluated today by Dr. Baruch Gouty, our radiation oncologists and decided to pursue radiation and chemotherapy patient was recently admitted in the hospital with sepsis.  A stent was changed.  No nausea.  No vomiting.  No diarrhea.  Here for further follow-up and treatment consideration. Patient is in assisted living facility   REVIEW OF SYSTEMS:   Gen. status: Patient is here for further evaluation regarding pancreatic adenocarcinoma.   Patient is in wheelchair. Performance status is declining Overall poor performance status because of multiple comorbid condition.   HEENT: Denies any headache no visual disturbances.   Lungs: Shortness of breath on exertion.   Cardiac: No chest pain.   GI: Patient had nausea vomiting epigastric discomfort diarrhea which is gradually improving.  No hematemesis.  No melena.   Lower extremities no swelling skin: No rash neurological system difficult to examine musculoskeletal system bilateral knee replacement  As per HPI. Otherwise, a complete review of systems is negatve.  PAST MEDICAL HISTORY: Past Medical History  Diagnosis Date  . Hypertension   . GERD (gastroesophageal reflux disease)   . Diabetes mellitus without complication ([place])   . Breast cancer ([place]) 2013    c Lumpectomy 2013  . Uterine cancer ([place]) 1960  . Pancreatic cancer ([place])   . Abnormal LFTs     PAST SURGICAL HISTORY: Past  Surgical History  Procedure Laterality Date  . Abdominal hysterectomy    . Knee arthroscopy    . Ercp N/A 09/04/2015    Procedure: ENDOSCOPIC RETROGRADE CHOLANGIOPANCREATOGRAPHY (ERCP);  Surgeon: Hulen Luster, MD;  Location: [facility] ENDOSCOPY;  Service: Gastroenterology;  Laterality: N/A;  . Breast biopsy Right 2012    Positive  . Upper esophageal endoscopic ultrasound (eus) N/A 10/04/2015    Procedure: UPPER  ESOPHAGEAL ENDOSCOPIC ULTRASOUND (EUS);  Surgeon: Holly Bodily, MD;  Location: [facility] ENDOSCOPY;  Service: Gastroenterology;  Laterality: N/A;  . Peripheral vascular catheterization N/A 11/05/2015    Procedure: Porta Cath Insertion;  Surgeon: Algernon Huxley, MD;  Location: [place] CV LAB;  Service: Cardiovascular;  Laterality: N/A;  . Ercp N/A 11/22/2015    Procedure: ENDOSCOPIC RETROGRADE CHOLANGIOPANCREATOGRAPHY (ERCP);  Surgeon: Hulen Luster, MD;  Location: [facility] ENDOSCOPY;  Service: Endoscopy;  Laterality: N/A;  For Thursday afternoon    FAMILY HISTORY Family History  Problem Relation Age of Onset  . Cancer Mother   . Heart disease Brother     ADVANCED DIRECTIVES:  No flowsheet data found.  HEALTH MAINTENANCE: Social History  Substance Use Topics  . Smoking status: Never Smoker   . Smokeless tobacco: Not on file  . Alcohol Use: No      Allergies  Allergen Reactions  . Colchicine   . Ketorolac   . Lactase Diarrhea  . Lipitor [Atorvastatin]     Current Outpatient Prescriptions  Medication Sig Dispense Refill  . amLODipine (NORVASC) 10 MG tablet Take 10 mg by mouth daily.    [name] aspirin 81 MG tablet Take 81 mg by mouth daily.    . febuxostat (ULORIC) 40 MG tablet Take 80 mg by mouth daily.    . furosemide (LASIX) 20 MG tablet Take 20 mg by mouth daily.    [name] glipiZIDE (GLUCOTROL) 5 MG tablet Take 5 mg by mouth daily before breakfast.     . insulin glargine (LANTUS) 100 UNIT/ML injection Inject 0.14 mLs (14 Units total) into the skin 2 (two) times daily. 10 mL 0  . letrozole (FEMARA) 2.5 MG tablet Take 2.5 mg by mouth daily.    [name] levothyroxine (SYNTHROID, LEVOTHROID) 75 MCG tablet Take 75 mcg by mouth daily before breakfast.    . metoprolol succinate (TOPROL-XL) 100 MG 24 hr tablet Take 100 mg by mouth daily. Take with or immediately following a meal.    . omeprazole (PRILOSEC) 20 MG capsule Take 20 mg by mouth daily.    . potassium chloride 20 MEQ TBCR Take 20 mEq by  mouth 2 (two) times daily. 30 tablet 0  . pravastatin (PRAVACHOL) 40 MG tablet Take 40 mg by mouth daily.    . promethazine (PHENERGAN) 25 MG tablet   1  . buPROPion (WELLBUTRIN SR) 150 MG 12 hr tablet Take 150 mg by mouth 2 (two) times daily. Reported on 12/06/2015    . ciprofloxacin (CIPRO) 500 MG tablet Take 1 tablet (500 mg total) by mouth 2 (two) times daily. (Patient not taking: Reported on 12/06/2015) 22 tablet 0  . HYDROcodone-acetaminophen (NORCO/VICODIN) 5-325 MG tablet Take 1 tablet by mouth every 6 (six) hours as needed for moderate pain. (Patient not taking: Reported on 12/06/2015) 20 tablet 0  . lisinopril (PRINIVIL,ZESTRIL) 40 MG tablet Take 40 mg by mouth daily. Reported on 12/06/2015    . loperamide (IMODIUM) 2 MG capsule Reported on 12/06/2015  0   No current facility-administered medications for this visit.   Facility-Administered Medications  Ordered in Other Visits  Medication Dose Route Frequency Provider Last Rate Last Dose  . 0.9 %  sodium chloride infusion   Intravenous Continuous Forest Gleason, MD   Stopped at 11/08/15 1144  . heparin lock flush 100 unit/mL  500 Units Intracatheter Once PRN Forest Gleason, MD      . heparin lock flush 100 unit/mL  500 Units Intravenous Once Forest Gleason, MD      . sodium chloride flush (NS) 0.9 % injection 10 mL  10 mL Intracatheter PRN Forest Gleason, MD   10 mL at 11/08/15 1052  . sodium chloride flush (NS) 0.9 % injection 10 mL  10 mL Intravenous PRN Forest Gleason, MD        OBJECTIVE:  Filed Vitals:   12/06/15 0907  BP: 105/68  Pulse: 64  Temp: 95.5 F (35.3 C)  Resp: 18     Body mass index is 38.47 kg/(m^2).    ECOG FS:2 - Symptomatic, <50% confined to bed  PHYSICAL EXAM: Patient is alert oriented not any acute distress.  Moderate obesity.  Patient is confined to the wheelchair. Appears to be somewhat depressed Lymphatic system: Supraclavicular, cervical, axillary, inguinal lymph nodes are not palpable. Lungs: Diminished air  entry on both sides. Cardiac: Soft systolic murmur. Abdomen: Difficult to examine protuberant abdomen no ascites tenderness in epigastric area skin: No rash Neurological system: Complete examination is difficult.  There is no localizing signs HEENT: No abnormality detected Patient's ambulation is limited. All other systems have been examined.   LAB RESULTS:  CBC Latest Ref Rng 12/06/2015 11/29/2015  WBC 3.6 - 11.0 K/uL 5.7 6.6  Hemoglobin 12.0 - 16.0 g/dL 10.1(L) 9.6(L)  Hematocrit 35.0 - 47.0 % 29.8(L) 28.5(L)  Platelets 150 - 440 K/uL 316 402    Appointment on 12/06/2015  Component Date Value Ref Range Status  . WBC 12/06/2015 5.7  3.6 - 11.0 K/uL Final  . RBC 12/06/2015 3.27* 3.80 - 5.20 MIL/uL Final  . Hemoglobin 12/06/2015 10.1* 12.0 - 16.0 g/dL Final  . HCT 12/06/2015 29.8* 35.0 - 47.0 % Final  . MCV 12/06/2015 91.1  80.0 - 100.0 fL Final  . MCH 12/06/2015 30.9  26.0 - 34.0 pg Final  . MCHC 12/06/2015 33.9  32.0 - 36.0 g/dL Final  . RDW 12/06/2015 17.7* 11.5 - 14.5 % Final  . Platelets 12/06/2015 316  150 - 440 K/uL Final  . Neutrophils Relative % 12/06/2015 74   Final  . Neutro Abs 12/06/2015 4.2  1.4 - 6.5 K/uL Final  . Lymphocytes Relative 12/06/2015 12   Final  . Lymphs Abs 12/06/2015 0.7* 1.0 - 3.6 K/uL Final  . Monocytes Relative 12/06/2015 9   Final  . Monocytes Absolute 12/06/2015 0.5  0.2 - 0.9 K/uL Final  . Eosinophils Relative 12/06/2015 3   Final  . Eosinophils Absolute 12/06/2015 0.2  0 - 0.7 K/uL Final  . Basophils Relative 12/06/2015 2   Final  . Basophils Absolute 12/06/2015 0.1  0 - 0.1 K/uL Final  . Sodium 12/06/2015 138  135 - 145 mmol/L Final  . Potassium 12/06/2015 5.4* 3.5 - 5.1 mmol/L Final  . Chloride 12/06/2015 107  101 - 111 mmol/L Final  . CO2 12/06/2015 PENDING  22 - 32 mmol/L Incomplete  . Glucose, Bld 12/06/2015 232* 65 - 99 mg/dL Final  . BUN 12/06/2015 29* 6 - 20 mg/dL Final  . Creatinine, Ser 12/06/2015 3.73* 0.44 - 1.00 mg/dL Final    . Calcium 12/06/2015 8.8* 8.9 -  10.3 mg/dL Final  . Total Protein 12/06/2015 6.8  6.5 - 8.1 g/dL Final  . Albumin 12/06/2015 3.2* 3.5 - 5.0 g/dL Final  . AST 12/06/2015 32  15 - 41 U/L Final  . ALT 12/06/2015 28  14 - 54 U/L Final  . Alkaline Phosphatase 12/06/2015 341* 38 - 126 U/L Final  . Total Bilirubin 12/06/2015 1.1  0.3 - 1.2 mg/dL Final  . GFR calc non Af Amer 12/06/2015 10* >60 mL/min Final  . GFR calc Af Amer 12/06/2015 12* >60 mL/min Final   Comment: (NOTE) The eGFR has been calculated using the CKD EPI equation. This calculation has not been validated in all clinical situations. eGFR's persistently <60 mL/min signify possible Chronic Kidney Disease.   . Anion gap 12/06/2015 PENDING  5 - 15 Incomplete       STUDIES: Ct Abdomen Pelvis Wo Contrast  11/20/2015  CLINICAL DATA:  Currently receiving treatments for new diagnosis of pancreatic cancer. Worsening weakness today. Hypotensive and tachypnea. EXAM: CT ABDOMEN AND PELVIS WITHOUT CONTRAST TECHNIQUE: Multidetector CT imaging of the abdomen and pelvis was performed following the standard protocol without IV contrast. COMPARISON:  08/31/2015. FINDINGS: There is a biliary stent which appears satisfactorily positioned. There are unremarkable unenhanced appearances of the liver, spleen and adrenals. Moderate fullness in the pancreatic head is unchanged. Pancreatic body and tail are unremarkable. Multiple focal renal lesions are present without significant interval change. These may represent benign cysts but they are not characterized in the absence of intravenous contrast. Collecting systems, ureters and urinary bladder are unremarkable. Multiple small bowel diverticula are present, not significantly changed. Colonic diverticulosis is present, not significantly changed. No evidence of bowel obstruction. No extraluminal air. No acute inflammatory changes are evident in the abdomen or pelvis. There is no ascites. The abdominal aorta is  normal in caliber with mild atherosclerotic calcification. Mild patchy and linear opacities are present in both lung bases, likely atelectatic. This is new from 08/31/2015. IMPRESSION: 1. No acute findings are evident in the abdomen or pelvis. 2. Satisfactorily positioned biliary stent. 3. Mild atelectatic opacities in both lung bases. Electronically Signed   By: Andreas Newport M.D.   On: 11/20/2015 00:35   Dg Chest Portable 1 View  11/19/2015  CLINICAL DATA:  Patient was recently dx with Patient was so weak today she was unable to get back into bed from bathroom, fell to floor. Patient was hypotensive and tachypnic upon arrival EXAM: PORTABLE CHEST 1 VIEW COMPARISON:  06/16/2005 FINDINGS: There is a right sided port-a-cath. There is no focal parenchymal opacity. There is no pleural effusion or pneumothorax. The heart and mediastinal contours are unremarkable. There is mild osteoarthritis of the left glenohumeral joint. IMPRESSION: No active disease. Electronically Signed   By: Kathreen Devoid   On: 11/19/2015 20:26   Dg C-arm 1-60 Min-no Report  11/22/2015  CLINICAL DATA: ercp, replace stent C-ARM 1-60 MINUTES Fluoroscopy was utilized by the requesting physician.  No radiographic interpretation.    ASSESSMENT: Carcinoma of head of pancreas positive for adenocarcinoma EUS staging is T2 N0 M0 Multiple comorbid condition Renal failure will preclude any other evaluation for involvement of SMA.  We will start patient on 5-FU by continuous infusion initially for 5 days In my absence patient would be evaluated by my associate.  If tolerated then it can be increased to 7 days of continuous infusion.  Patient was instructed to call us right away if DIARRHEA or soreness in the mouth.  The cause of patient's  comorbid condition as well as elderly situation will have to carefully monitor 5-FU by continuous infusion along with radiation therapy and monitor his side effect.  Recent admission in the hospital  because of infected stent. Hospital records have been reviewed. All lab data has been reviewed  Continue 5-FU by continuous infusion  Patient expressed understanding and was in agreement with this plan. She also understands that She can call clinic at any time with any questions, concerns, or complaints.    No matching staging information was found for the patient.  Forest Gleason, MD   12/06/2015 9:23 AM

## 2015-12-06 NOTE — Progress Notes (Signed)
Pt's creatinine 3.73 today. Verified with Dr Oliva Bustard, pt to receive treatment today

## 2015-12-07 ENCOUNTER — Encounter: Payer: Self-pay | Admitting: Oncology

## 2015-12-07 ENCOUNTER — Ambulatory Visit
Admission: RE | Admit: 2015-12-07 | Discharge: 2015-12-07 | Disposition: A | Discharge status: Home / Self Care | Payer: Medicare Other | Source: Ambulatory Visit | Attending: Radiation Oncology | Admitting: Radiation Oncology

## 2015-12-07 DIAGNOSIS — C250 Malignant neoplasm of head of pancreas: Secondary | ICD-10-CM | POA: Diagnosis not present

## 2015-12-10 ENCOUNTER — Ambulatory Visit
Admission: RE | Admit: 2015-12-10 | Discharge: 2015-12-10 | Disposition: A | Discharge status: Home / Self Care | Payer: Medicare Other | Source: Ambulatory Visit | Attending: Radiation Oncology | Admitting: Radiation Oncology

## 2015-12-10 ENCOUNTER — Other Ambulatory Visit: Payer: Self-pay | Admitting: *Deleted

## 2015-12-10 DIAGNOSIS — C250 Malignant neoplasm of head of pancreas: Secondary | ICD-10-CM | POA: Diagnosis not present

## 2015-12-11 ENCOUNTER — Ambulatory Visit
Admission: RE | Admit: 2015-12-11 | Discharge: 2015-12-11 | Disposition: A | Discharge status: Home / Self Care | Payer: Medicare Other | Source: Ambulatory Visit | Attending: Radiation Oncology | Admitting: Radiation Oncology

## 2015-12-11 ENCOUNTER — Inpatient Hospital Stay: Payer: Medicare Other

## 2015-12-11 VITALS — BP 90/58 | HR 98 | Temp 100.2°F | Resp 20

## 2015-12-11 DIAGNOSIS — C250 Malignant neoplasm of head of pancreas: Secondary | ICD-10-CM

## 2015-12-11 MED ORDER — HEPARIN SOD (PORK) LOCK FLUSH 100 UNIT/ML IV SOLN: 500.0000 [IU] | Freq: Once | INTRAVENOUS | Status: DC | PRN

## 2015-12-11 MED ORDER — HEPARIN SOD (PORK) LOCK FLUSH 100 UNIT/ML IV SOLN
INTRAVENOUS | Status: AC
  Filled 2015-12-11: qty 5

## 2015-12-11 MED ORDER — HEPARIN SOD (PORK) LOCK FLUSH 100 UNIT/ML IV SOLN
500.0000 [IU] | Freq: Once | INTRAVENOUS | Status: AC
  Administered 2015-12-11: 500 [IU] via INTRAVENOUS

## 2015-12-11 MED ORDER — SODIUM CHLORIDE 0.9 % IJ SOLN
10.0000 mL | INTRAMUSCULAR | Status: DC | PRN
  Administered 2015-12-11: 10 mL
  Filled 2015-12-11: qty 10

## 2015-12-11 MED ORDER — SODIUM CHLORIDE 0.9 % IV SOLN
Freq: Once | INTRAVENOUS | Status: AC
  Administered 2015-12-11: 12:00:00 via INTRAVENOUS
  Filled 2015-12-11: qty 1000

## 2015-12-12 ENCOUNTER — Ambulatory Visit
Admission: RE | Admit: 2015-12-12 | Discharge: 2015-12-12 | Disposition: A | Discharge status: Home / Self Care | Payer: Medicare Other | Source: Ambulatory Visit | Attending: Radiation Oncology | Admitting: Radiation Oncology

## 2015-12-12 DIAGNOSIS — C250 Malignant neoplasm of head of pancreas: Secondary | ICD-10-CM | POA: Diagnosis not present

## 2015-12-13 ENCOUNTER — Inpatient Hospital Stay (HOSPITAL_BASED_OUTPATIENT_CLINIC_OR_DEPARTMENT_OTHER): Payer: Medicare Other | Admitting: Oncology

## 2015-12-13 ENCOUNTER — Inpatient Hospital Stay: Payer: Medicare Other

## 2015-12-13 ENCOUNTER — Ambulatory Visit: Admission: RE | Admit: 2015-12-13 | Payer: Medicare Other | Source: Ambulatory Visit

## 2015-12-13 VITALS — BP 108/70 | HR 79 | Temp 98.5°F | Resp 18

## 2015-12-13 DIAGNOSIS — Z79811 Long term (current) use of aromatase inhibitors: Secondary | ICD-10-CM

## 2015-12-13 DIAGNOSIS — C250 Malignant neoplasm of head of pancreas: Secondary | ICD-10-CM

## 2015-12-13 DIAGNOSIS — Z7982 Long term (current) use of aspirin: Secondary | ICD-10-CM

## 2015-12-13 DIAGNOSIS — Z809 Family history of malignant neoplasm, unspecified: Secondary | ICD-10-CM

## 2015-12-13 DIAGNOSIS — Z853 Personal history of malignant neoplasm of breast: Secondary | ICD-10-CM

## 2015-12-13 DIAGNOSIS — E669 Obesity, unspecified: Secondary | ICD-10-CM

## 2015-12-13 DIAGNOSIS — I129 Hypertensive chronic kidney disease with stage 1 through stage 4 chronic kidney disease, or unspecified chronic kidney disease: Secondary | ICD-10-CM | POA: Diagnosis not present

## 2015-12-13 DIAGNOSIS — K830 Cholangitis: Secondary | ICD-10-CM

## 2015-12-13 DIAGNOSIS — R740 Nonspecific elevation of levels of transaminase and lactic acid dehydrogenase [LDH]: Secondary | ICD-10-CM

## 2015-12-13 DIAGNOSIS — Z794 Long term (current) use of insulin: Secondary | ICD-10-CM

## 2015-12-13 DIAGNOSIS — K219 Gastro-esophageal reflux disease without esophagitis: Secondary | ICD-10-CM

## 2015-12-13 DIAGNOSIS — Z8542 Personal history of malignant neoplasm of other parts of uterus: Secondary | ICD-10-CM

## 2015-12-13 DIAGNOSIS — N189 Chronic kidney disease, unspecified: Secondary | ICD-10-CM | POA: Diagnosis not present

## 2015-12-13 DIAGNOSIS — E119 Type 2 diabetes mellitus without complications: Secondary | ICD-10-CM

## 2015-12-13 DIAGNOSIS — Z79899 Other long term (current) drug therapy: Secondary | ICD-10-CM

## 2015-12-13 DIAGNOSIS — Z7984 Long term (current) use of oral hypoglycemic drugs: Secondary | ICD-10-CM

## 2015-12-13 DIAGNOSIS — R630 Anorexia: Secondary | ICD-10-CM

## 2015-12-13 LAB — CBC WITH DIFFERENTIAL/PLATELET
BASOS ABS: 0 10*3/uL (ref 0–0.1)
BASOS PCT: 0 %
EOS ABS: 0 10*3/uL (ref 0–0.7)
EOS PCT: 0 %
HCT: 30.2 % — ABNORMAL LOW (ref 35.0–47.0)
Hemoglobin: 9.9 g/dL — ABNORMAL LOW (ref 12.0–16.0)
LYMPHS PCT: 1 %
Lymphs Abs: 0.2 10*3/uL — ABNORMAL LOW (ref 1.0–3.6)
MCH: 30 pg (ref 26.0–34.0)
MCHC: 32.7 g/dL (ref 32.0–36.0)
MCV: 91.8 fL (ref 80.0–100.0)
MONO ABS: 1 10*3/uL — AB (ref 0.2–0.9)
Monocytes Relative: 6 %
NEUTROS ABS: 15 10*3/uL — AB (ref 1.4–6.5)
NEUTROS PCT: 93 %
PLATELETS: 232 10*3/uL (ref 150–440)
RBC: 3.29 MIL/uL — ABNORMAL LOW (ref 3.80–5.20)
RDW: 17.3 % — AB (ref 11.5–14.5)
WBC: 16.3 10*3/uL — AB (ref 3.6–11.0)

## 2015-12-13 LAB — COMPREHENSIVE METABOLIC PANEL
ALBUMIN: 2.4 g/dL — AB (ref 3.5–5.0)
ALT: 30 U/L (ref 14–54)
AST: 80 U/L — AB (ref 15–41)
Alkaline Phosphatase: 303 U/L — ABNORMAL HIGH (ref 38–126)
Anion gap: 10 (ref 5–15)
BUN: 52 mg/dL — AB (ref 6–20)
CO2: 19 mmol/L — AB (ref 22–32)
Calcium: 8.3 mg/dL — ABNORMAL LOW (ref 8.9–10.3)
Chloride: 115 mmol/L — ABNORMAL HIGH (ref 101–111)
Creatinine, Ser: 3.46 mg/dL — ABNORMAL HIGH (ref 0.44–1.00)
GFR calc Af Amer: 13 mL/min — ABNORMAL LOW (ref >60)
GFR, EST NON AFRICAN AMERICAN: 11 mL/min — AB (ref >60)
GLUCOSE: 54 mg/dL — AB (ref 65–99)
POTASSIUM: 3.7 mmol/L (ref 3.5–5.1)
Sodium: 144 mmol/L (ref 135–145)
Total Bilirubin: 1 mg/dL (ref 0.3–1.2)
Total Protein: 6.8 g/dL (ref 6.5–8.1)

## 2015-12-13 MED ORDER — SODIUM CHLORIDE 0.9 % IV SOLN
225.0000 mg/m2/d | INTRAVENOUS | Status: DC
  Administered 2015-12-13: 2700 mg via INTRAVENOUS
  Filled 2015-12-13: qty 54

## 2015-12-13 MED ORDER — SODIUM CHLORIDE 0.9 % IV SOLN
225.0000 mg/m2/d | INTRAVENOUS | Status: DC
  Filled 2015-12-13: qty 54

## 2015-12-14 ENCOUNTER — Ambulatory Visit: Payer: Medicare Other

## 2015-12-14 ENCOUNTER — Encounter: Payer: Self-pay | Admitting: Oncology

## 2015-12-14 NOTE — Progress Notes (Signed)
[place] @ [facility] [phone]  Fax:([phone]) [place] OB: 1930-01-27  MR#: [id number]  MWU#:[id number]  Patient Care Team: [name], MD as PCP - General (Physician Assistant) [name], RN as Registered Nurse  CHIEF COMPLAINT:  Chief Complaint  Patient presents with  . Pancreatic Cancer  1.  Carcinoma of head of pancreas 23 mm millimeter by 17 mm invading superior  MESENTERIC VEIN.  No arterial invasion was found. T2 N0 M0 tumor by EUS criteria.  Biopsy was positive for adenocarcinoma. (February, 2017). 2.  Carcinoma of breast diagnosis was in August 25 2011.  T1 cN0 M0 tumor ER/PR positive. 3.  Multiple comorbid condition with chronic renal failure, diabetes, high blood pressure, obesity bilateral knee replacement and poor performance status 4.We will start patient on 5-FU by continuous infusion and radiation therapy (November 08, 2015) and    No history exists.    Oncology Flowsheet 09/06/2015 11/08/2015 11/15/2015 11/22/2015 11/29/2015 12/06/2015 12/13/2015  Day, Cycle - Day 1, Cycle 1 Day 1, Cycle 2 - Day 1, Cycle 3 - -  fluorouracil (ADRUCIL) IV - 225 mg/m2/day 2,700 mg - 225 mg/m2/day 2,700 mg 225 mg/m2/day  letrozole (FEMARA) PO 2.5 mg - - - - - -  ondansetron (ZOFRAN) IJ - - - - - - -  ondansetron (ZOFRAN) IV - - - - - - -  ondansetron (ZOFRAN) PO - - - - - - -    INTERVAL HISTORY: 82 year old lady who is in wheelchair.  Patient was admitted in hospital for abdominal pain.  Nausea vomiting diarrhea underwent extensive workup included a noncontrast CT scan which revealed pancreatic mass.  Patient underwent EUS which was positive for adenocarcinoma and was stage TII N0 M0 tumor patient is here for further follow-up and consideration of treatment.  No nausea no vomiting.  Diarrhea has improved.  Patient has multiple comorbid condition.  As mentioned above [place] bilateral knee replacement, moderate obesity, in wheelchair walking with the  help of walker.  Has a chronic renal failure, high blood pressure, diabetes, Patient could not get MRI scan because of significant renal insufficiency. Has been evaluated today by Dr. Baruch Gouty, our radiation oncologists and decided to pursue radiation and chemotherapy patient was recently admitted in the hospital with sepsis.  A stent was changed.  No nausea.  No vomiting.  No diarrhea.  Here for further follow-up and treatment consideration. Patient is in assisted living facility Patient's performance status today remains poor.  Has occasional nausea but no soreness in the mouth no diarrhea.  Appetite remains poor Patient had a follow-up with nephrologist with [facility] system.  Last appointment with nephrology was a year ago   REVIEW OF SYSTEMS:   Gen. status: Patient is here for further evaluation regarding pancreatic adenocarcinoma.   Patient is in wheelchair. Performance status is declining Overall poor performance status because of multiple comorbid condition.   HEENT: Denies any headache no visual disturbances.   Lungs: Shortness of breath on exertion.   Cardiac: No chest pain.   GI: Patient had nausea vomiting epigastric discomfort diarrhea which is gradually improving.  No hematemesis.  No melena.   Lower extremities no swelling skin: No rash neurological system difficult to examine musculoskeletal system bilateral knee replacement  As per HPI. Otherwise, a complete review of systems is negatve.  PAST MEDICAL HISTORY: Past Medical History  Diagnosis Date  . Hypertension   . GERD (gastroesophageal reflux disease)   . Diabetes mellitus without  complication ([place])   . Breast cancer ([place]) 2013    c Lumpectomy 2013  . Uterine cancer ([place]) 1960  . Pancreatic cancer ([place])   . Abnormal LFTs     PAST SURGICAL HISTORY: Past Surgical History  Procedure Laterality Date  . Abdominal hysterectomy    . Knee arthroscopy    . Ercp N/A 09/04/2015    Procedure: ENDOSCOPIC RETROGRADE  CHOLANGIOPANCREATOGRAPHY (ERCP);  Surgeon: Hulen Luster, MD;  Location: [facility] ENDOSCOPY;  Service: Gastroenterology;  Laterality: N/A;  . Breast biopsy Right 2012    Positive  . Upper esophageal endoscopic ultrasound (eus) N/A 10/04/2015    Procedure: UPPER ESOPHAGEAL ENDOSCOPIC ULTRASOUND (EUS);  Surgeon: Holly Bodily, MD;  Location: [facility] ENDOSCOPY;  Service: Gastroenterology;  Laterality: N/A;  . Peripheral vascular catheterization N/A 11/05/2015    Procedure: Porta Cath Insertion;  Surgeon: Algernon Huxley, MD;  Location: [place] CV LAB;  Service: Cardiovascular;  Laterality: N/A;  . Ercp N/A 11/22/2015    Procedure: ENDOSCOPIC RETROGRADE CHOLANGIOPANCREATOGRAPHY (ERCP);  Surgeon: Hulen Luster, MD;  Location: [facility] ENDOSCOPY;  Service: Endoscopy;  Laterality: N/A;  For Thursday afternoon    FAMILY HISTORY Family History  Problem Relation Age of Onset  . Cancer Mother   . Heart disease Brother     ADVANCED DIRECTIVES:  No flowsheet data found.  HEALTH MAINTENANCE: Social History  Substance Use Topics  . Smoking status: Never Smoker   . Smokeless tobacco: None  . Alcohol Use: No      Allergies  Allergen Reactions  . Colchicine   . Ketorolac   . Lactase Diarrhea  . Lipitor [Atorvastatin]     Current Outpatient Prescriptions  Medication Sig Dispense Refill  . amLODipine (NORVASC) 10 MG tablet Take 10 mg by mouth daily.    [name] aspirin 81 MG tablet Take 81 mg by mouth daily.    [name] buPROPion (WELLBUTRIN SR) 150 MG 12 hr tablet Take 150 mg by mouth 2 (two) times daily. Reported on 12/06/2015    . febuxostat (ULORIC) 40 MG tablet Take 80 mg by mouth daily.    . furosemide (LASIX) 20 MG tablet Take 20 mg by mouth daily.    [name] glipiZIDE (GLUCOTROL) 5 MG tablet Take 5 mg by mouth daily before breakfast.     . HYDROcodone-acetaminophen (NORCO/VICODIN) 5-325 MG tablet Take 1 tablet by mouth every 6 (six) hours as needed for moderate pain. 20 tablet 0  . insulin glargine (LANTUS) 100  UNIT/ML injection Inject 0.14 mLs (14 Units total) into the skin 2 (two) times daily. 10 mL 0  . letrozole (FEMARA) 2.5 MG tablet Take 2.5 mg by mouth daily.    [name] levothyroxine (SYNTHROID, LEVOTHROID) 75 MCG tablet Take 75 mcg by mouth daily before breakfast.    . lisinopril (PRINIVIL,ZESTRIL) 40 MG tablet Take 40 mg by mouth daily. Reported on 12/06/2015    . loperamide (IMODIUM) 2 MG capsule Reported on 12/06/2015  0  . metoprolol succinate (TOPROL-XL) 100 MG 24 hr tablet Take 100 mg by mouth daily. Take with or immediately following a meal.    . omeprazole (PRILOSEC) 20 MG capsule Take 20 mg by mouth daily.    . potassium chloride 20 MEQ TBCR Take 20 mEq by mouth 2 (two) times daily. 30 tablet 0  . pravastatin (PRAVACHOL) 40 MG tablet Take 40 mg by mouth daily.    . promethazine (PHENERGAN) 25 MG tablet   1   No current facility-administered medications for this visit.  Facility-Administered Medications Ordered in Other Visits  Medication Dose Route Frequency Provider Last Rate Last Dose  . 0.9 %  sodium chloride infusion   Intravenous Continuous Forest Gleason, MD   Stopped at 11/08/15 1144  . heparin lock flush 100 unit/mL  500 Units Intracatheter Once PRN Forest Gleason, MD      . sodium chloride flush (NS) 0.9 % injection 10 mL  10 mL Intracatheter PRN Forest Gleason, MD   10 mL at 11/08/15 1052    OBJECTIVE:  Filed Vitals:   12/13/15 1027  BP: 108/70  Pulse: 79  Temp: 98.5 F (36.9 C)  Resp: 18     There is no weight on file to calculate BMI.    ECOG FS:2 - Symptomatic, <50% confined to bed  PHYSICAL EXAM: Patient is alert oriented not any acute distress.  Moderate obesity.  Patient is confined to the wheelchair. Appears to be somewhat depressed Lymphatic system: Supraclavicular, cervical, axillary, inguinal lymph nodes are not palpable. Lungs: Diminished air entry on both sides. Cardiac: Soft systolic murmur. Abdomen: Difficult to examine protuberant abdomen no ascites  tenderness in epigastric area skin: No rash Neurological system: Complete examination is difficult.  There is no localizing signs HEENT: No abnormality detected Patient's ambulation is limited. All other systems have been examined.   LAB RESULTS:  CBC Latest Ref Rng 12/13/2015 12/06/2015  WBC 3.6 - 11.0 K/uL 16.3(H) 5.7  Hemoglobin 12.0 - 16.0 g/dL 9.9(L) 10.1(L)  Hematocrit 35.0 - 47.0 % 30.2(L) 29.8(L)  Platelets 150 - 440 K/uL 232 316    Clinical Support on 12/13/2015  Component Date Value Ref Range Status  . WBC 12/13/2015 16.3* 3.6 - 11.0 K/uL Final  . RBC 12/13/2015 3.29* 3.80 - 5.20 MIL/uL Final  . Hemoglobin 12/13/2015 9.9* 12.0 - 16.0 g/dL Final  . HCT 12/13/2015 30.2* 35.0 - 47.0 % Final  . MCV 12/13/2015 91.8  80.0 - 100.0 fL Final  . MCH 12/13/2015 30.0  26.0 - 34.0 pg Final  . MCHC 12/13/2015 32.7  32.0 - 36.0 g/dL Final  . RDW 12/13/2015 17.3* 11.5 - 14.5 % Final  . Platelets 12/13/2015 232  150 - 440 K/uL Final  . Neutrophils Relative % 12/13/2015 93   Final  . Neutro Abs 12/13/2015 15.0* 1.4 - 6.5 K/uL Final  . Lymphocytes Relative 12/13/2015 1   Final  . Lymphs Abs 12/13/2015 0.2* 1.0 - 3.6 K/uL Final  . Monocytes Relative 12/13/2015 6   Final  . Monocytes Absolute 12/13/2015 1.0* 0.2 - 0.9 K/uL Final  . Eosinophils Relative 12/13/2015 0   Final  . Eosinophils Absolute 12/13/2015 0.0  0 - 0.7 K/uL Final  . Basophils Relative 12/13/2015 0   Final  . Basophils Absolute 12/13/2015 0.0  0 - 0.1 K/uL Final  . Sodium 12/13/2015 144  135 - 145 mmol/L Final  . Potassium 12/13/2015 3.7  3.5 - 5.1 mmol/L Final  . Chloride 12/13/2015 115* 101 - 111 mmol/L Final  . CO2 12/13/2015 19* 22 - 32 mmol/L Final  . Glucose, Bld 12/13/2015 54* 65 - 99 mg/dL Final  . BUN 12/13/2015 52* 6 - 20 mg/dL Final  . Creatinine, Ser 12/13/2015 3.46* 0.44 - 1.00 mg/dL Final  . Calcium 12/13/2015 8.3* 8.9 - 10.3 mg/dL Final  . Total Protein 12/13/2015 6.8  6.5 - 8.1 g/dL Final  . Albumin  12/13/2015 2.4* 3.5 - 5.0 g/dL Final  . AST 12/13/2015 80* 15 - 41 U/L Final  . ALT 12/13/2015 30  14 - 54 U/L Final  . Alkaline Phosphatase 12/13/2015 303* 38 - 126 U/L Final  . Total Bilirubin 12/13/2015 1.0  0.3 - 1.2 mg/dL Final  . GFR calc non Af Amer 12/13/2015 11* >60 mL/min Final  . GFR calc Af Amer 12/13/2015 13* >60 mL/min Final   Comment: (NOTE) The eGFR has been calculated using the CKD EPI equation. This calculation has not been validated in all clinical situations. eGFR's persistently <60 mL/min signify possible Chronic Kidney Disease.   . Anion gap 12/13/2015 10  5 - 15 Final       STUDIES: Ct Abdomen Pelvis Wo Contrast  11/20/2015  CLINICAL DATA:  Currently receiving treatments for new diagnosis of pancreatic cancer. Worsening weakness today. Hypotensive and tachypnea. EXAM: CT ABDOMEN AND PELVIS WITHOUT CONTRAST TECHNIQUE: Multidetector CT imaging of the abdomen and pelvis was performed following the standard protocol without IV contrast. COMPARISON:  08/31/2015. FINDINGS: There is a biliary stent which appears satisfactorily positioned. There are unremarkable unenhanced appearances of the liver, spleen and adrenals. Moderate fullness in the pancreatic head is unchanged. Pancreatic body and tail are unremarkable. Multiple focal renal lesions are present without significant interval change. These may represent benign cysts but they are not characterized in the absence of intravenous contrast. Collecting systems, ureters and urinary bladder are unremarkable. Multiple small bowel diverticula are present, not significantly changed. Colonic diverticulosis is present, not significantly changed. No evidence of bowel obstruction. No extraluminal air. No acute inflammatory changes are evident in the abdomen or pelvis. There is no ascites. The abdominal aorta is normal in caliber with mild atherosclerotic calcification. Mild patchy and linear opacities are present in both lung bases,  likely atelectatic. This is new from 08/31/2015. IMPRESSION: 1. No acute findings are evident in the abdomen or pelvis. 2. Satisfactorily positioned biliary stent. 3. Mild atelectatic opacities in both lung bases. Electronically Signed   By: Andreas Newport M.D.   On: 11/20/2015 00:35   Dg Chest Portable 1 View  11/19/2015  CLINICAL DATA:  Patient was recently dx with Patient was so weak today she was unable to get back into bed from bathroom, fell to floor. Patient was hypotensive and tachypnic upon arrival EXAM: PORTABLE CHEST 1 VIEW COMPARISON:  06/16/2005 FINDINGS: There is a right sided port-a-cath. There is no focal parenchymal opacity. There is no pleural effusion or pneumothorax. The heart and mediastinal contours are unremarkable. There is mild osteoarthritis of the left glenohumeral joint. IMPRESSION: No active disease. Electronically Signed   By: Kathreen Devoid   On: 11/19/2015 20:26   Dg C-arm 1-60 Min-no Report  11/22/2015  CLINICAL DATA: ercp, replace stent C-ARM 1-60 MINUTES Fluoroscopy was utilized by the requesting physician.  No radiographic interpretation.    ASSESSMENT: Carcinoma of head of pancreas positive for adenocarcinoma EUS staging is T2 N0 M0 Multiple comorbid condition Renal failure will preclude any other evaluation for involvement of SMA.  We will start patient on 5-FU by continuous infusion initially for 5 days In my absence patient would be evaluated by my associate.  If tolerated then it can be increased to 7 days of continuous infusion.  Continue 5-FU over 5 days. Continue radiation therapy Patient has requested appointment with local nephrology practice those of transportation issues.  Patient has been referred to [facility] kidney associated. All lab data has been reviewed.  Patient expressed understanding and was in agreement with this plan. She also understands that She can call clinic at any time with any questions, concerns,  or complaints.    No  matching staging information was found for the patient.  Forest Gleason, MD   12/14/2015 7:46 AM

## 2015-12-17 ENCOUNTER — Ambulatory Visit
Admission: RE | Admit: 2015-12-17 | Discharge: 2015-12-17 | Disposition: A | Discharge status: Home / Self Care | Payer: Medicare Other | Source: Ambulatory Visit | Attending: Radiation Oncology | Admitting: Radiation Oncology

## 2015-12-17 DIAGNOSIS — C250 Malignant neoplasm of head of pancreas: Secondary | ICD-10-CM | POA: Diagnosis not present

## 2015-12-18 ENCOUNTER — Ambulatory Visit: Admission: RE | Admit: 2015-12-18 | Payer: Medicare Other | Source: Ambulatory Visit

## 2015-12-18 ENCOUNTER — Emergency Department: Payer: Medicare Other

## 2015-12-18 ENCOUNTER — Encounter: Payer: Self-pay | Admitting: *Deleted

## 2015-12-18 ENCOUNTER — Emergency Department
Admission: EM | Admit: 2015-12-18 | Discharge: 2015-12-18 | Disposition: A | Discharge status: Home / Self Care | Payer: Medicare Other | Attending: Emergency Medicine | Admitting: Emergency Medicine

## 2015-12-18 ENCOUNTER — Inpatient Hospital Stay: Payer: Medicare Other | Attending: Internal Medicine

## 2015-12-18 DIAGNOSIS — E039 Hypothyroidism, unspecified: Secondary | ICD-10-CM | POA: Insufficient documentation

## 2015-12-18 DIAGNOSIS — R55 Syncope and collapse: Secondary | ICD-10-CM

## 2015-12-18 DIAGNOSIS — Z794 Long term (current) use of insulin: Secondary | ICD-10-CM | POA: Insufficient documentation

## 2015-12-18 DIAGNOSIS — I120 Hypertensive chronic kidney disease with stage 5 chronic kidney disease or end stage renal disease: Secondary | ICD-10-CM | POA: Insufficient documentation

## 2015-12-18 DIAGNOSIS — R630 Anorexia: Secondary | ICD-10-CM

## 2015-12-18 DIAGNOSIS — Z8542 Personal history of malignant neoplasm of other parts of uterus: Secondary | ICD-10-CM | POA: Insufficient documentation

## 2015-12-18 DIAGNOSIS — Z7982 Long term (current) use of aspirin: Secondary | ICD-10-CM | POA: Insufficient documentation

## 2015-12-18 DIAGNOSIS — N185 Chronic kidney disease, stage 5: Secondary | ICD-10-CM | POA: Insufficient documentation

## 2015-12-18 DIAGNOSIS — Z5111 Encounter for antineoplastic chemotherapy: Secondary | ICD-10-CM | POA: Insufficient documentation

## 2015-12-18 DIAGNOSIS — Z7984 Long term (current) use of oral hypoglycemic drugs: Secondary | ICD-10-CM | POA: Insufficient documentation

## 2015-12-18 DIAGNOSIS — E785 Hyperlipidemia, unspecified: Secondary | ICD-10-CM | POA: Insufficient documentation

## 2015-12-18 DIAGNOSIS — Z853 Personal history of malignant neoplasm of breast: Secondary | ICD-10-CM | POA: Diagnosis not present

## 2015-12-18 DIAGNOSIS — N189 Chronic kidney disease, unspecified: Secondary | ICD-10-CM | POA: Insufficient documentation

## 2015-12-18 DIAGNOSIS — Z79811 Long term (current) use of aromatase inhibitors: Secondary | ICD-10-CM | POA: Insufficient documentation

## 2015-12-18 DIAGNOSIS — C50911 Malignant neoplasm of unspecified site of right female breast: Secondary | ICD-10-CM | POA: Insufficient documentation

## 2015-12-18 DIAGNOSIS — C250 Malignant neoplasm of head of pancreas: Secondary | ICD-10-CM | POA: Insufficient documentation

## 2015-12-18 DIAGNOSIS — Z809 Family history of malignant neoplasm, unspecified: Secondary | ICD-10-CM | POA: Insufficient documentation

## 2015-12-18 DIAGNOSIS — I129 Hypertensive chronic kidney disease with stage 1 through stage 4 chronic kidney disease, or unspecified chronic kidney disease: Secondary | ICD-10-CM | POA: Insufficient documentation

## 2015-12-18 DIAGNOSIS — C786 Secondary malignant neoplasm of retroperitoneum and peritoneum: Secondary | ICD-10-CM | POA: Insufficient documentation

## 2015-12-18 DIAGNOSIS — K219 Gastro-esophageal reflux disease without esophagitis: Secondary | ICD-10-CM | POA: Insufficient documentation

## 2015-12-18 DIAGNOSIS — E1122 Type 2 diabetes mellitus with diabetic chronic kidney disease: Secondary | ICD-10-CM | POA: Insufficient documentation

## 2015-12-18 DIAGNOSIS — Z170 Estrogen receptor positive status [ER+]: Secondary | ICD-10-CM | POA: Insufficient documentation

## 2015-12-18 DIAGNOSIS — E119 Type 2 diabetes mellitus without complications: Secondary | ICD-10-CM | POA: Insufficient documentation

## 2015-12-18 DIAGNOSIS — Z79899 Other long term (current) drug therapy: Secondary | ICD-10-CM | POA: Diagnosis not present

## 2015-12-18 DIAGNOSIS — R7989 Other specified abnormal findings of blood chemistry: Secondary | ICD-10-CM | POA: Insufficient documentation

## 2015-12-18 DIAGNOSIS — E669 Obesity, unspecified: Secondary | ICD-10-CM | POA: Insufficient documentation

## 2015-12-18 LAB — GLUCOSE, CAPILLARY: GLUCOSE-CAPILLARY: 201 mg/dL — AB (ref 65–99)

## 2015-12-18 LAB — CBC
HCT: 29.3 % — ABNORMAL LOW (ref 35.0–47.0)
Hemoglobin: 9.7 g/dL — ABNORMAL LOW (ref 12.0–16.0)
MCH: 30 pg (ref 26.0–34.0)
MCHC: 33 g/dL (ref 32.0–36.0)
MCV: 90.9 fL (ref 80.0–100.0)
PLATELETS: 316 10*3/uL (ref 150–440)
RBC: 3.22 MIL/uL — AB (ref 3.80–5.20)
RDW: 16.8 % — ABNORMAL HIGH (ref 11.5–14.5)
WBC: 10.7 10*3/uL (ref 3.6–11.0)

## 2015-12-18 LAB — URINALYSIS COMPLETE WITH MICROSCOPIC (ARMC ONLY)
Bilirubin Urine: NEGATIVE
GLUCOSE, UA: NEGATIVE mg/dL
Hgb urine dipstick: NEGATIVE
Ketones, ur: NEGATIVE mg/dL
Leukocytes, UA: NEGATIVE
Nitrite: NEGATIVE
Protein, ur: NEGATIVE mg/dL
Specific Gravity, Urine: 1.015 (ref 1.005–1.030)
pH: 5 (ref 5.0–8.0)

## 2015-12-18 LAB — BASIC METABOLIC PANEL
Anion gap: 11 (ref 5–15)
BUN: 35 mg/dL — AB (ref 6–20)
CO2: 19 mmol/L — ABNORMAL LOW (ref 22–32)
CREATININE: 2.88 mg/dL — AB (ref 0.44–1.00)
Calcium: 8.3 mg/dL — ABNORMAL LOW (ref 8.9–10.3)
Chloride: 109 mmol/L (ref 101–111)
GFR calc Af Amer: 16 mL/min — ABNORMAL LOW (ref >60)
GFR, EST NON AFRICAN AMERICAN: 14 mL/min — AB (ref >60)
Glucose, Bld: 243 mg/dL — ABNORMAL HIGH (ref 65–99)
Potassium: 4.4 mmol/L (ref 3.5–5.1)
SODIUM: 139 mmol/L (ref 135–145)

## 2015-12-18 MED ORDER — SODIUM CHLORIDE 0.9 % IV BOLUS (SEPSIS)
1000.0000 mL | Freq: Once | INTRAVENOUS | Status: AC
  Administered 2015-12-18: 1000 mL via INTRAVENOUS

## 2015-12-18 MED ORDER — HEPARIN SOD (PORK) LOCK FLUSH 100 UNIT/ML IV SOLN: 500.0000 [IU] | Freq: Once | INTRAVENOUS | Status: AC

## 2015-12-18 MED ORDER — SODIUM CHLORIDE 0.9% FLUSH
10.0000 mL | INTRAVENOUS | Status: AC | PRN
  Filled 2015-12-18: qty 10

## 2015-12-18 MED ORDER — HEPARIN SOD (PORK) LOCK FLUSH 100 UNIT/ML IV SOLN
500.0000 [IU] | Freq: Once | INTRAVENOUS | Status: AC
  Administered 2015-12-18: 500 [IU] via INTRAVENOUS

## 2015-12-18 MED ORDER — HEPARIN SOD (PORK) LOCK FLUSH 100 UNIT/ML IV SOLN
INTRAVENOUS | Status: AC
  Administered 2015-12-18: 500 [IU] via INTRAVENOUS
  Filled 2015-12-18: qty 5

## 2015-12-18 NOTE — ED Notes (Signed)
Pt reports she has had a continuous chemo pump that used to give treatments for 7 days in a row. Pt has been decreased this week to 5 days of treatment. Today was reported to be the last day of treatment and pt will have two days without treatment.

## 2015-12-18 NOTE — ED Notes (Signed)
Patient transported to X-ray 

## 2015-12-18 NOTE — Discharge Instructions (Signed)
Please drink plenty of fluid to stay well hydrated and eat small regular meals throughout the day.  Return to the emergency department if you develop severe pain, fever, lightheadedness or fainting, chest pain, shortness of breath, palpitations, or any other symptoms concerning to you.  Syncope Syncope is a medical term for fainting or passing out. This means you lose consciousness and drop to the ground. People are generally unconscious for less than 5 minutes. You may have some muscle twitches for up to 15 seconds before waking up and returning to normal. Syncope occurs more often in older adults, but it can happen to anyone. While most causes of syncope are not dangerous, syncope can be a sign of a serious medical problem. It is important to seek medical care.  CAUSES  Syncope is caused by a sudden drop in blood flow to the brain. The specific cause is often not determined. Factors that can bring on syncope include:  Taking medicines that lower blood pressure.  Sudden changes in posture, such as standing up quickly.  Taking more medicine than prescribed.  Standing in one place for too long.  Seizure disorders.  Dehydration and excessive exposure to heat.  Low blood sugar (hypoglycemia).  Straining to have a bowel movement.  Heart disease, irregular heartbeat, or other circulatory problems.  Fear, emotional distress, seeing blood, or severe pain. SYMPTOMS  Right before fainting, you may:  Feel dizzy or light-headed.  Feel nauseous.  See all white or all black in your field of vision.  Have cold, clammy skin. DIAGNOSIS  Your health care provider will ask about your symptoms, perform a physical exam, and perform an electrocardiogram (ECG) to record the electrical activity of your heart. Your health care provider may also perform other heart or blood tests to determine the cause of your syncope which may include:  Transthoracic echocardiogram (TTE). During echocardiography,  sound waves are used to evaluate how blood flows through your heart.  Transesophageal echocardiogram (TEE).  Cardiac monitoring. This allows your health care provider to monitor your heart rate and rhythm in real time.  Holter monitor. This is a portable device that records your heartbeat and can help diagnose heart arrhythmias. It allows your health care provider to track your heart activity for several days, if needed.  Stress tests by exercise or by giving medicine that makes the heart beat faster. TREATMENT  In most cases, no treatment is needed. Depending on the cause of your syncope, your health care provider may recommend changing or stopping some of your medicines. HOME CARE INSTRUCTIONS  Have someone stay with you until you feel stable.  Do not drive, use machinery, or play sports until your health care provider says it is okay.  Keep all follow-up appointments as directed by your health care provider.  Lie down right away if you start feeling like you might faint. Breathe deeply and steadily. Wait until all the symptoms have passed.  Drink enough fluids to keep your urine clear or pale yellow.  If you are taking blood pressure or heart medicine, get up slowly and take several minutes to sit and then stand. This can reduce dizziness. SEEK IMMEDIATE MEDICAL CARE IF:   You have a severe headache.  You have unusual pain in the chest, abdomen, or back.  You are bleeding from your mouth or rectum, or you have black or tarry stool.  You have an irregular or very fast heartbeat.  You have pain with breathing.  You have repeated fainting or  seizure-like jerking during an episode.  You faint when sitting or lying down.  You have confusion.  You have trouble walking.  You have severe weakness.  You have vision problems. If you fainted, call your local emergency services (911 in [place] Do not drive yourself to the hospital.    This information is not intended to replace  advice given to you by your health care provider. Make sure you discuss any questions you have with your health care provider.   Document Released: 08/04/2005 Document Revised: 12/19/2014 Document Reviewed: 10/03/2011 Elsevier Interactive Patient Education [employer].

## 2015-12-18 NOTE — ED Notes (Signed)
Pt on bedpan and attempting to provide urine sample at this time.

## 2015-12-18 NOTE — ED Notes (Signed)
3 attempts at in and out catheter have failed. MD made aware. Pt verbalized she would inform staff if she felt the need to urinate and pt will be placed on bedpan.

## 2015-12-18 NOTE — ED Notes (Signed)
[facility] after a syncopal episode after attempting to transfer from wheelchair. Pt was reported to have been unresponsive for 2 minutes. Pt did not stop breathing or lose pulses throughout this time. Pt is diabetic and family reports she has had decreased oral intake over the past few days. Pt reports having eaten a banana today but has not checked BG. Pt is alert and oriented upon arrival to ED. No acute distress noted. Pt port is accessed upon arrival.

## 2015-12-18 NOTE — ED Notes (Signed)
MD at bedside. Pt denies pain and denies a fall. Pt verbalized becoming dizzy after standing.

## 2015-12-18 NOTE — ED Provider Notes (Signed)
[facility] Emergency Department Provider Note  ____________________________________________  Time seen: Approximately 11:59 AM  I have reviewed the triage vital signs and the nursing notes.   HISTORY  Chief Complaint Loss of Consciousness    HPI GWENDOLYNN Preston is a 82 y.o. female with a history of pancreatic cancer currently undergoing chemotherapy and radiation, DM, HTN, presenting for syncopal episode. The patient reports that for several weeks she has had a significant decrease in her by mouth intake due to loss of appetite. This morning she went for radiation and when she stood up from her wheelchair she became lightheaded and had a brief syncopal episode. She did not follow she was caught by the health care personnel. She did not have any associated chest pain, palpitations, headache, shortness of breath. She is not had any recent nausea, vomiting, diarrhea, fever, chills, cough or cold symptoms. At this time she continues to feel tired, but is not having any symptoms. Bedside glucose pending.   Past Medical History  Diagnosis Date  . Hypertension   . GERD (gastroesophageal reflux disease)   . Diabetes mellitus without complication ([place])   . Breast cancer ([place]) 2013    c Lumpectomy 2013  . Uterine cancer ([place]) 1960  . Pancreatic cancer ([place])   . Abnormal LFTs     Patient Active Problem List   Diagnosis Date Noted  . Pressure ulcer 11/21/2015  . Sepsis ([place]) 11/20/2015  . Jaundice 11/20/2015  . Allergic rhinitis 10/16/2015  . Controlled type 2 diabetes mellitus without complication ([place]) [id number]  . Acid reflux 10/16/2015  . Gout 10/16/2015  . H/O malignant neoplasm of breast 10/16/2015  . HLD (hyperlipidemia) 10/16/2015  . BP (high blood pressure) 10/16/2015  . Adult hypothyroidism 10/16/2015  . Anemia, iron deficiency 10/16/2015  . Morbid obesity ([place]) 10/16/2015  . Adiposity 10/16/2015  . Arthritis, degenerative 10/16/2015  . Primary  malignant neoplasm of head of pancreas ([place]) 10/11/2015  . Protein-calorie malnutrition, severe 09/02/2015  . Severe protein-calorie malnutrition [place]ame[employer] [place]: less than 60% of standard weight) ([place]) 09/02/2015  . Obstructive jaundice 08/31/2015  . Biliary and gallbladder disorder 08/31/2015  . Abnormal loss of weight 07/26/2015  . D (diarrhea) 07/26/2015  . Abnormal weight loss 07/26/2015  . Type 2 diabetes mellitus ([place]) 03/11/2015  . Right hip pain 01/24/2013  . HTN (hypertension) 01/24/2013  . CKD (chronic kidney disease) stage 5, GFR less than 15 ml/min (HCC) 01/24/2013  . Diabetes mellitus type 2 in obese ([place]) 01/24/2013  . Chronic kidney disease, stage IV (severe) ([place]) 01/24/2013  . Essential (primary) hypertension 01/24/2013  . Arthralgia of hip or thigh 01/24/2013    Past Surgical History  Procedure Laterality Date  . Abdominal hysterectomy    . Knee arthroscopy    . Ercp N/A 09/04/2015    Procedure: ENDOSCOPIC RETROGRADE CHOLANGIOPANCREATOGRAPHY (ERCP);  Surgeon: Hulen Luster, MD;  Location: [facility] ENDOSCOPY;  Service: Gastroenterology;  Laterality: N/A;  . Breast biopsy Right 2012    Positive  . Upper esophageal endoscopic ultrasound (eus) N/A 10/04/2015    Procedure: UPPER ESOPHAGEAL ENDOSCOPIC ULTRASOUND (EUS);  Surgeon: Holly Bodily, MD;  Location: [facility] ENDOSCOPY;  Service: Gastroenterology;  Laterality: N/A;  . Peripheral vascular catheterization N/A 11/05/2015    Procedure: Porta Cath Insertion;  Surgeon: Algernon Huxley, MD;  Location: [place] CV LAB;  Service: Cardiovascular;  Laterality: N/A;  . Ercp N/A 11/22/2015    Procedure: ENDOSCOPIC RETROGRADE CHOLANGIOPANCREATOGRAPHY (ERCP);  Surgeon: Hulen Luster, MD;  Location:  [facility] ENDOSCOPY;  Service: Endoscopy;  Laterality: N/A;  For Thursday afternoon    Current Outpatient Rx  Name  Route  Sig  Dispense  Refill  . amLODipine (NORVASC) 10 MG tablet   Oral   Take 10 mg by mouth daily.         [name] aspirin 81 MG tablet    Oral   Take 81 mg by mouth daily.         [name] buPROPion (WELLBUTRIN SR) 150 MG 12 hr tablet   Oral   Take 150 mg by mouth 2 (two) times daily. Reported on 12/06/2015         . febuxostat (ULORIC) 40 MG tablet   Oral   Take 80 mg by mouth daily.         . furosemide (LASIX) 20 MG tablet   Oral   Take 20 mg by mouth daily.         [name] glipiZIDE (GLUCOTROL) 5 MG tablet   Oral   Take 5 mg by mouth daily before breakfast.          . HYDROcodone-acetaminophen (NORCO/VICODIN) 5-325 MG tablet   Oral   Take 1 tablet by mouth every 6 (six) hours as needed for moderate pain.   20 tablet   0   . insulin glargine (LANTUS) 100 UNIT/ML injection   Subcutaneous   Inject 0.14 mLs (14 Units total) into the skin 2 (two) times daily.   10 mL   0   . letrozole (FEMARA) 2.5 MG tablet   Oral   Take 2.5 mg by mouth daily.         [name] levothyroxine (SYNTHROID, LEVOTHROID) 75 MCG tablet   Oral   Take 75 mcg by mouth daily before breakfast.         . lisinopril (PRINIVIL,ZESTRIL) 40 MG tablet   Oral   Take 40 mg by mouth daily. Reported on 12/06/2015         . loperamide (IMODIUM) 2 MG capsule      Reported on 12/06/2015      0   . metoprolol succinate (TOPROL-XL) 100 MG 24 hr tablet   Oral   Take 100 mg by mouth daily. Take with or immediately following a meal.         . omeprazole (PRILOSEC) 20 MG capsule   Oral   Take 20 mg by mouth daily.         . potassium chloride 20 MEQ TBCR   Oral   Take 20 mEq by mouth 2 (two) times daily.   30 tablet   0   . pravastatin (PRAVACHOL) 40 MG tablet   Oral   Take 40 mg by mouth daily.         . promethazine (PHENERGAN) 25 MG tablet            1     Allergies Colchicine; Ketorolac; Lactase; and Lipitor  Family History  Problem Relation Age of Onset  . Cancer Mother   . Heart disease Brother     Social History Social History  Substance Use Topics  . Smoking status: Never Smoker   . Smokeless tobacco: None   . Alcohol Use: No    Review of Systems Constitutional: No fever/chills.Positive lightheadedness and syncope. No injury, no fall. Positive decreased appetite and decreased by mouth intake. Eyes: No visual changes. No blurred or double vision. ENT: No sore throat. No congestion or rhinorrhea. Cardiovascular: Denies chest pain. Denies palpitations.  Respiratory: Denies shortness of breath.  No cough. Gastrointestinal: No abdominal pain.  No nausea, no vomiting.  No diarrhea.  No constipation. Genitourinary: Negative for dysuria. Musculoskeletal: Negative for back pain. Skin: Negative for rash. Neurological: Negative for headaches. No focal numbness, tingling or weakness.   10-point ROS otherwise negative.  ____________________________________________   PHYSICAL EXAM:  VITAL SIGNS: ED Triage Vitals  Enc Vitals Group     BP 12/18/15 1144 102/61 mmHg     Pulse Rate 12/18/15 1144 82     Resp 12/18/15 1144 20     Temp 12/18/15 1144 97.6 F (36.4 C)     Temp Source 12/18/15 1144 Oral     SpO2 12/18/15 1144 100 %     Weight 12/18/15 1144 252 lb (114.306 kg)     Height --      Head Cir --      Peak Flow --      Pain Score --      Pain Loc --      Pain Edu? --      Excl. in [place]? --     Constitutional: Alert and oriented. Chronically ill appearing and in no acute distress. Answers questions appropriately. Appears tired. Eyes: Conjunctivae are normal.  EOMI. No scleral icterus. Head: Atraumatic. Nose: No congestion/rhinnorhea. Mouth/Throat: Mucous membranes are moist.  Neck: No stridor.  Supple.  No JVD. No meningismus. Cardiovascular: Normal rate, regular rhythm. No murmurs, rubs or gallops. Port in place in the right upper chest without any surrounding swelling, erythema, or discharge. Respiratory: Normal respiratory effort.  No accessory muscle use or retractions. Lungs CTAB.  No wheezes, rales or ronchi. Gastrointestinal: Obese. Soft, nontender and nondistended.  No guarding  or rebound.  No peritoneal signs. Musculoskeletal: No LE edema. No ttp in the calves or palpable cords.  Negative Homan's sign. Neurologic:  A&Ox3.  Speech is clear.  Face and smile are symmetric.  EOMI.  Moves all extremities well. Skin:  Skin is warm, dry and intact. No rash noted. Psychiatric: Mood and affect are normal. Speech and behavior are normal.  Normal judgement.  ____________________________________________   LABS (all labs ordered are listed, but only abnormal results are displayed)  Labs Reviewed  GLUCOSE, CAPILLARY - Abnormal; Notable for the following:    Glucose-Capillary 201 (*)    All other components within normal limits  CBC - Abnormal; Notable for the following:    RBC 3.22 (*)    Hemoglobin 9.7 (*)    HCT 29.3 (*)    RDW 16.8 (*)    All other components within normal limits  BASIC METABOLIC PANEL - Abnormal; Notable for the following:    CO2 19 (*)    Glucose, Bld 243 (*)    BUN 35 (*)    Creatinine, Ser 2.88 (*)    Calcium 8.3 (*)    GFR calc non Af Amer 14 (*)    GFR calc Af Amer 16 (*)    All other components within normal limits  URINALYSIS COMPLETEWITH MICROSCOPIC ([facility] ONLY) - Abnormal; Notable for the following:    Color, Urine YELLOW (*)    APPearance CLEAR (*)    Bacteria, UA RARE (*)    Squamous Epithelial / LPF 0-5 (*)    All other components within normal limits   ____________________________________________  EKG  ED ECG REPORT I, Eula Listen, the attending physician, personally viewed and interpreted this ECG.   Date: 12/18/2015  EKG Time: 1143  Rate: 82  Rhythm: normal  sinus rhythm  Axis: Leftward  Intervals:none  ST&T Change: No ST elevation.  ____________________________________________  RADIOLOGY  Dg Chest 2 View  12/18/2015  CLINICAL DATA:  Syncope.  History of breast and uterine carcinoma EXAM: CHEST  2 VIEW COMPARISON:  November 19, 2015. FINDINGS: Port-A-Cath tip is in the superior vena cava. No pneumothorax.  There is no edema or consolidation. Heart size and pulmonary vascularity are normal. No adenopathy. No bone lesions. IMPRESSION: No edema or consolidation. Port-A-Cath tip in superior vena cava. No pneumothorax. Electronically Signed   By: Lowella Grip III M.D.   On: 12/18/2015 13:41    ____________________________________________   PROCEDURES  Procedure(s) performed: None  Critical Care performed: No ____________________________________________   INITIAL IMPRESSION / ASSESSMENT AND PLAN / ED COURSE  Pertinent labs & imaging results that were available during my care of the patient were reviewed by me and considered in my medical decision making (see chart for details).  82 y.o. female with recent decreased appetite and decreased intake on chemotherapy and radiation for pancreatic cancer presenting with a syncopal episode that occurred after standing in the oncology center today. On my exam, the patient has no focal findings. She has normal vital signs, no focal neurologic deficits, and a normal cardiopulmonary examination. Her EKG also does not show any arrhythmia or ischemic changes. She does appear mildly clinically dehydrated, and I'm concerned either for hypoglycemia or intravascular volume depletion from dehydration that may have caused her syncopal episode. We'll get basic labs, EKG, chest x-ray, and reevaluate the patient.  ----------------------------------------- 3:27 PM on 12/18/2015 -----------------------------------------  The patient is feeling much better at this time. She is not orthostatic on vital signs but did feel slightly dizzy when she sat up. Her labs are otherwise reassuring today and her EKG does not show ischemic changes. Plan discharge with close PMD follow-up. Return precautions as well as follow-up instructions were discussed. ____________________________________________  FINAL CLINICAL IMPRESSION(S) / ED DIAGNOSES  Final diagnoses:  Syncope,  unspecified syncope type  Poor appetite      NEW MEDICATIONS STARTED DURING THIS VISIT:  New Prescriptions   No medications on file     Eula Listen, MD 12/18/15 1527

## 2015-12-19 ENCOUNTER — Ambulatory Visit
Admission: RE | Admit: 2015-12-19 | Discharge: 2015-12-19 | Disposition: A | Discharge status: Home / Self Care | Payer: Medicare Other | Source: Ambulatory Visit | Attending: Radiation Oncology | Admitting: Radiation Oncology

## 2015-12-19 DIAGNOSIS — C250 Malignant neoplasm of head of pancreas: Secondary | ICD-10-CM | POA: Diagnosis not present

## 2015-12-20 ENCOUNTER — Ambulatory Visit
Admission: RE | Admit: 2015-12-20 | Discharge: 2015-12-20 | Disposition: A | Discharge status: Home / Self Care | Payer: Medicare Other | Source: Ambulatory Visit | Attending: Radiation Oncology | Admitting: Radiation Oncology

## 2015-12-20 ENCOUNTER — Encounter: Payer: Self-pay | Admitting: Oncology

## 2015-12-20 ENCOUNTER — Inpatient Hospital Stay (HOSPITAL_BASED_OUTPATIENT_CLINIC_OR_DEPARTMENT_OTHER): Payer: Medicare Other | Admitting: Oncology

## 2015-12-20 ENCOUNTER — Inpatient Hospital Stay: Payer: Medicare Other

## 2015-12-20 ENCOUNTER — Telehealth: Payer: Self-pay | Admitting: *Deleted

## 2015-12-20 VITALS — BP 142/76 | HR 74 | Temp 95.2°F | Resp 18

## 2015-12-20 DIAGNOSIS — Z8542 Personal history of malignant neoplasm of other parts of uterus: Secondary | ICD-10-CM

## 2015-12-20 DIAGNOSIS — C50911 Malignant neoplasm of unspecified site of right female breast: Secondary | ICD-10-CM | POA: Diagnosis not present

## 2015-12-20 DIAGNOSIS — Z170 Estrogen receptor positive status [ER+]: Secondary | ICD-10-CM

## 2015-12-20 DIAGNOSIS — K219 Gastro-esophageal reflux disease without esophagitis: Secondary | ICD-10-CM | POA: Diagnosis not present

## 2015-12-20 DIAGNOSIS — C250 Malignant neoplasm of head of pancreas: Secondary | ICD-10-CM | POA: Diagnosis present

## 2015-12-20 DIAGNOSIS — Z79899 Other long term (current) drug therapy: Secondary | ICD-10-CM | POA: Diagnosis not present

## 2015-12-20 DIAGNOSIS — Z7982 Long term (current) use of aspirin: Secondary | ICD-10-CM

## 2015-12-20 DIAGNOSIS — C786 Secondary malignant neoplasm of retroperitoneum and peritoneum: Secondary | ICD-10-CM

## 2015-12-20 DIAGNOSIS — I129 Hypertensive chronic kidney disease with stage 1 through stage 4 chronic kidney disease, or unspecified chronic kidney disease: Secondary | ICD-10-CM

## 2015-12-20 DIAGNOSIS — E669 Obesity, unspecified: Secondary | ICD-10-CM | POA: Diagnosis not present

## 2015-12-20 DIAGNOSIS — N189 Chronic kidney disease, unspecified: Secondary | ICD-10-CM

## 2015-12-20 DIAGNOSIS — Z809 Family history of malignant neoplasm, unspecified: Secondary | ICD-10-CM | POA: Diagnosis not present

## 2015-12-20 DIAGNOSIS — Z794 Long term (current) use of insulin: Secondary | ICD-10-CM

## 2015-12-20 DIAGNOSIS — Z79811 Long term (current) use of aromatase inhibitors: Secondary | ICD-10-CM

## 2015-12-20 DIAGNOSIS — Z7984 Long term (current) use of oral hypoglycemic drugs: Secondary | ICD-10-CM

## 2015-12-20 DIAGNOSIS — R7989 Other specified abnormal findings of blood chemistry: Secondary | ICD-10-CM | POA: Diagnosis not present

## 2015-12-20 DIAGNOSIS — E119 Type 2 diabetes mellitus without complications: Secondary | ICD-10-CM

## 2015-12-20 DIAGNOSIS — Z5111 Encounter for antineoplastic chemotherapy: Secondary | ICD-10-CM | POA: Diagnosis not present

## 2015-12-20 LAB — CBC WITH DIFFERENTIAL/PLATELET
BASOS ABS: 0 10*3/uL (ref 0–0.1)
BASOS PCT: 0 %
Eosinophils Absolute: 0.1 10*3/uL (ref 0–0.7)
Eosinophils Relative: 1 %
HEMATOCRIT: 29.4 % — AB (ref 35.0–47.0)
HEMOGLOBIN: 10 g/dL — AB (ref 12.0–16.0)
LYMPHS PCT: 4 %
Lymphs Abs: 0.5 10*3/uL — ABNORMAL LOW (ref 1.0–3.6)
MCH: 31 pg (ref 26.0–34.0)
MCHC: 34 g/dL (ref 32.0–36.0)
MCV: 91.1 fL (ref 80.0–100.0)
Monocytes Absolute: 0.4 10*3/uL (ref 0.2–0.9)
Monocytes Relative: 4 %
NEUTROS ABS: 9.8 10*3/uL — AB (ref 1.4–6.5)
NEUTROS PCT: 91 %
Platelets: 407 10*3/uL (ref 150–440)
RBC: 3.23 MIL/uL — AB (ref 3.80–5.20)
RDW: 16.3 % — ABNORMAL HIGH (ref 11.5–14.5)
WBC: 10.7 10*3/uL (ref 3.6–11.0)

## 2015-12-20 LAB — COMPREHENSIVE METABOLIC PANEL
ALBUMIN: 2.6 g/dL — AB (ref 3.5–5.0)
ALK PHOS: 245 U/L — AB (ref 38–126)
ALT: 28 U/L (ref 14–54)
AST: 36 U/L (ref 15–41)
Anion gap: 8 (ref 5–15)
BILIRUBIN TOTAL: 0.7 mg/dL (ref 0.3–1.2)
BUN: 29 mg/dL — AB (ref 6–20)
CALCIUM: 8.7 mg/dL — AB (ref 8.9–10.3)
CO2: 21 mmol/L — ABNORMAL LOW (ref 22–32)
CREATININE: 2.21 mg/dL — AB (ref 0.44–1.00)
Chloride: 110 mmol/L (ref 101–111)
GFR calc Af Amer: 22 mL/min — ABNORMAL LOW (ref >60)
GFR, EST NON AFRICAN AMERICAN: 19 mL/min — AB (ref >60)
GLUCOSE: 127 mg/dL — AB (ref 65–99)
POTASSIUM: 4 mmol/L (ref 3.5–5.1)
Sodium: 139 mmol/L (ref 135–145)
TOTAL PROTEIN: 6.6 g/dL (ref 6.5–8.1)

## 2015-12-20 MED ORDER — HEPARIN SOD (PORK) LOCK FLUSH 100 UNIT/ML IV SOLN
500.0000 [IU] | Freq: Once | INTRAVENOUS | Status: DC
Start: 2015-12-20 — End: 2015-12-20

## 2015-12-20 MED ORDER — SODIUM CHLORIDE 0.9% FLUSH
10.0000 mL | INTRAVENOUS | Status: DC | PRN
  Administered 2015-12-20: 10 mL via INTRAVENOUS
  Filled 2015-12-20: qty 10

## 2015-12-20 MED ORDER — SODIUM CHLORIDE 0.9 % IV SOLN
225.0000 mg/m2/d | INTRAVENOUS | Status: DC
  Administered 2015-12-20: 2700 mg via INTRAVENOUS
  Filled 2015-12-20: qty 54

## 2015-12-20 MED ORDER — SODIUM CHLORIDE 0.9% FLUSH
10.0000 mL | INTRAVENOUS | Status: DC | PRN
  Administered 2015-12-20: 10 mL
  Filled 2015-12-20: qty 10

## 2015-12-20 NOTE — Progress Notes (Signed)
Treatment plan date changed to 12/20/2015 per MD, Dr. Oliva Bustard, order. Creatinine: 2.21. Proceed with treatment today per Dr. Oliva Bustard order.

## 2015-12-20 NOTE — Telephone Encounter (Signed)
-----   Message from Cephus Richer sent at 12/20/2015 10:26 AM EDT ----- Please fax referral, lab, notes to [phone] .... Dr. Candiss Norse office will need information before they can schedule appt.   Thanks [phone]

## 2015-12-20 NOTE — Progress Notes (Signed)
[place] @ [facility] [phone]  Fax:([phone]) [place] OB: 22-Mar-1930  MR#: [id number]  QMV#:[id number]  Patient Care Team: [name], MD as PCP - General (Physician Assistant) [name], RN as Registered Nurse  CHIEF COMPLAINT:  Chief Complaint  Patient presents with  . Pancreatic Cancer  1.  Carcinoma of head of pancreas 23 mm millimeter by 17 mm invading superior  MESENTERIC VEIN.  No arterial invasion was found. T2 N0 M0 tumor by EUS criteria.  Biopsy was positive for adenocarcinoma. (February, 2017). 2.  Carcinoma of breast diagnosis was in August 25 2011.  T1 cN0 M0 tumor ER/PR positive. 3.  Multiple comorbid condition with chronic renal failure, diabetes, high blood pressure, obesity bilateral knee replacement and poor performance status 4.We will start patient on 5-FU by continuous infusion and radiation therapy (November 08, 2015) and    No history exists.    Oncology Flowsheet 09/06/2015 11/08/2015 11/15/2015 11/22/2015 11/29/2015 12/06/2015 12/13/2015  Day, Cycle - Day 1, Cycle 1 Day 1, Cycle 2 - Day 1, Cycle 3 - -  fluorouracil (ADRUCIL) IV - 225 mg/m2/day 2,700 mg - 225 mg/m2/day 2,700 mg 225 mg/m2/day  letrozole (FEMARA) PO 2.5 mg - - - - - -  ondansetron (ZOFRAN) IJ - - - - - - -  ondansetron (ZOFRAN) IV - - - - - - -  ondansetron (ZOFRAN) PO - - - - - - -    INTERVAL HISTORY: 82 year old lady who is in wheelchair.  Patient was admitted in hospital for abdominal pain.  Nausea vomiting diarrhea underwent extensive workup included a noncontrast CT scan which revealed pancreatic mass.  Patient underwent EUS which was positive for adenocarcinoma and was stage TII N0 M0 tumor patient is here for further follow-up and consideration of treatment.  No nausea no vomiting.  Diarrhea has improved.  Patient has multiple comorbid condition.  As mentioned above [place] bilateral knee replacement, moderate obesity, in wheelchair walking with the  help of walker.  Has a chronic renal failure, high blood pressure, diabetes,  Has been evaluated today by Dr. Baruch Gouty, our radiation oncologists and decided to pursue radiation and chemotherapy patient was recently admitted in the hospital with sepsis.  A stent was changed.  No nausea.  No vomiting.  No diarrhea.  Here for further follow-up and treatment consideration. Patient is in assisted living facility Patient's performance status today remains poor.  Has occasional nausea but no soreness in the mouth no diarrhea.  Appetite remains poor Patient had a follow-up with nephrologist with [facility] system.  Last appointment with nephrology was a year ago   Patient is here for ongoing evaluation and treatment consideration Continuing 5-FU and radiation therapy.  Patient is finishing up radiation therapy and 17th of May   REVIEW OF SYSTEMS:   Gen. status: Patient is here for further evaluation regarding pancreatic adenocarcinoma.   Patient is in wheelchair. Performance status is declining Overall poor performance status because of multiple comorbid condition.   HEENT: Denies any headache no visual disturbances.   Lungs: Shortness of breath on exertion.   Cardiac: No chest pain.   GI: Patient had nausea vomiting epigastric discomfort diarrhea which is gradually improving.  No hematemesis.  No melena.   Lower extremities no swelling skin: No rash neurological system difficult to examine musculoskeletal system bilateral knee replacement  As per HPI. Otherwise, a complete review of systems is negatve.  PAST MEDICAL HISTORY: Past Medical History  Diagnosis  Date  . Hypertension   . GERD (gastroesophageal reflux disease)   . Diabetes mellitus without complication ([place])   . Breast cancer ([place]) 2013    c Lumpectomy 2013  . Uterine cancer ([place]) 1960  . Pancreatic cancer ([place])   . Abnormal LFTs     PAST SURGICAL HISTORY: Past Surgical History  Procedure Laterality Date  . Abdominal  hysterectomy    . Knee arthroscopy    . Ercp N/A 09/04/2015    Procedure: ENDOSCOPIC RETROGRADE CHOLANGIOPANCREATOGRAPHY (ERCP);  Surgeon: Hulen Luster, MD;  Location: [facility] ENDOSCOPY;  Service: Gastroenterology;  Laterality: N/A;  . Breast biopsy Right 2012    Positive  . Upper esophageal endoscopic ultrasound (eus) N/A 10/04/2015    Procedure: UPPER ESOPHAGEAL ENDOSCOPIC ULTRASOUND (EUS);  Surgeon: Holly Bodily, MD;  Location: [facility] ENDOSCOPY;  Service: Gastroenterology;  Laterality: N/A;  . Peripheral vascular catheterization N/A 11/05/2015    Procedure: Porta Cath Insertion;  Surgeon: Algernon Huxley, MD;  Location: [place] CV LAB;  Service: Cardiovascular;  Laterality: N/A;  . Ercp N/A 11/22/2015    Procedure: ENDOSCOPIC RETROGRADE CHOLANGIOPANCREATOGRAPHY (ERCP);  Surgeon: Hulen Luster, MD;  Location: [facility] ENDOSCOPY;  Service: Endoscopy;  Laterality: N/A;  For Thursday afternoon    FAMILY HISTORY Family History  Problem Relation Age of Onset  . Cancer Mother   . Heart disease Brother     ADVANCED DIRECTIVES:  No flowsheet data found.  HEALTH MAINTENANCE: Social History  Substance Use Topics  . Smoking status: Never Smoker   . Smokeless tobacco: None  . Alcohol Use: No      Allergies  Allergen Reactions  . Colchicine   . Ketorolac   . Lactase Diarrhea  . Lipitor [Atorvastatin]     Current Outpatient Prescriptions  Medication Sig Dispense Refill  . amLODipine (NORVASC) 10 MG tablet Take 10 mg by mouth daily.    [name] aspirin 81 MG tablet Take 81 mg by mouth daily.    [name] buPROPion (WELLBUTRIN SR) 150 MG 12 hr tablet Take 150 mg by mouth 2 (two) times daily. Reported on 12/06/2015    . febuxostat (ULORIC) 40 MG tablet Take 80 mg by mouth daily.    . furosemide (LASIX) 20 MG tablet Take 20 mg by mouth daily.    [name] glipiZIDE (GLUCOTROL) 5 MG tablet Take 5 mg by mouth daily before breakfast.     . HYDROcodone-acetaminophen (NORCO/VICODIN) 5-325 MG tablet Take 1 tablet by mouth  every 6 (six) hours as needed for moderate pain. 20 tablet 0  . insulin glargine (LANTUS) 100 UNIT/ML injection Inject 0.14 mLs (14 Units total) into the skin 2 (two) times daily. 10 mL 0  . letrozole (FEMARA) 2.5 MG tablet Take 2.5 mg by mouth daily.    [name] levothyroxine (SYNTHROID, LEVOTHROID) 75 MCG tablet Take 75 mcg by mouth daily before breakfast.    . lisinopril (PRINIVIL,ZESTRIL) 40 MG tablet Take 40 mg by mouth daily. Reported on 12/06/2015    . loperamide (IMODIUM) 2 MG capsule Reported on 12/06/2015  0  . metoprolol succinate (TOPROL-XL) 100 MG 24 hr tablet Take 100 mg by mouth daily. Take with or immediately following a meal.    . omeprazole (PRILOSEC) 20 MG capsule Take 20 mg by mouth daily.    . potassium chloride 20 MEQ TBCR Take 20 mEq by mouth 2 (two) times daily. 30 tablet 0  . pravastatin (PRAVACHOL) 40 MG tablet Take 40 mg by mouth daily.    . promethazine (  PHENERGAN) 25 MG tablet   1   No current facility-administered medications for this visit.   Facility-Administered Medications Ordered in Other Visits  Medication Dose Route Frequency Provider Last Rate Last Dose  . 0.9 %  sodium chloride infusion   Intravenous Continuous Forest Gleason, MD   Stopped at 11/08/15 1144  . heparin lock flush 100 unit/mL  500 Units Intracatheter Once PRN Forest Gleason, MD      . heparin lock flush 100 unit/mL  500 Units Intravenous Once Forest Gleason, MD      . sodium chloride flush (NS) 0.9 % injection 10 mL  10 mL Intracatheter PRN Forest Gleason, MD   10 mL at 11/08/15 1052  . sodium chloride flush (NS) 0.9 % injection 10 mL  10 mL Intravenous PRN Forest Gleason, MD      . sodium chloride flush (NS) 0.9 % injection 10 mL  10 mL Intravenous PRN Forest Gleason, MD   10 mL at 12/20/15 0930    OBJECTIVE:  Filed Vitals:   12/20/15 0955  BP: 142/76  Pulse: 74  Temp: 95.2 F (35.1 C)  Resp: 18     There is no weight on file to calculate BMI.    ECOG FS:2 - Symptomatic, <50% confined to  bed  PHYSICAL EXAM: Patient is alert oriented not any acute distress.  Moderate obesity.  Patient is confined to the wheelchair. Appears to be somewhat depressed Lymphatic system: Supraclavicular, cervical, axillary, inguinal lymph nodes are not palpable. Lungs: Diminished air entry on both sides. Cardiac: Soft systolic murmur. Abdomen: Difficult to examine protuberant abdomen no ascites tenderness in epigastric area skin: No rash Neurological system: Complete examination is difficult.  There is no localizing signs HEENT: No abnormality detected Patient's ambulation is limited. All other systems have been examined.   LAB RESULTS:  CBC Latest Ref Rng 12/20/2015 12/18/2015  WBC 3.6 - 11.0 K/uL 10.7 10.7  Hemoglobin 12.0 - 16.0 g/dL 10.0(L) 9.7(L)  Hematocrit 35.0 - 47.0 % 29.4(L) 29.3(L)  Platelets 150 - 440 K/uL 407 316    Infusion on 12/20/2015  Component Date Value Ref Range Status  . WBC 12/20/2015 10.7  3.6 - 11.0 K/uL Final  . RBC 12/20/2015 3.23* 3.80 - 5.20 MIL/uL Final  . Hemoglobin 12/20/2015 10.0* 12.0 - 16.0 g/dL Final  . HCT 12/20/2015 29.4* 35.0 - 47.0 % Final  . MCV 12/20/2015 91.1  80.0 - 100.0 fL Final  . MCH 12/20/2015 31.0  26.0 - 34.0 pg Final  . MCHC 12/20/2015 34.0  32.0 - 36.0 g/dL Final  . RDW 12/20/2015 16.3* 11.5 - 14.5 % Final  . Platelets 12/20/2015 407  150 - 440 K/uL Final  . Neutrophils Relative % 12/20/2015 91   Final  . Neutro Abs 12/20/2015 9.8* 1.4 - 6.5 K/uL Final  . Lymphocytes Relative 12/20/2015 4   Final  . Lymphs Abs 12/20/2015 0.5* 1.0 - 3.6 K/uL Final  . Monocytes Relative 12/20/2015 4   Final  . Monocytes Absolute 12/20/2015 0.4  0.2 - 0.9 K/uL Final  . Eosinophils Relative 12/20/2015 1   Final  . Eosinophils Absolute 12/20/2015 0.1  0 - 0.7 K/uL Final  . Basophils Relative 12/20/2015 0   Final  . Basophils Absolute 12/20/2015 0.0  0 - 0.1 K/uL Final  . Sodium 12/20/2015 139  135 - 145 mmol/L Final  . Potassium 12/20/2015 4.0  3.5 -  5.1 mmol/L Final  . Chloride 12/20/2015 110  101 - 111 mmol/L Final  .  CO2 12/20/2015 21* 22 - 32 mmol/L Final  . Glucose, Bld 12/20/2015 127* 65 - 99 mg/dL Final  . BUN 12/20/2015 29* 6 - 20 mg/dL Final  . Creatinine, Ser 12/20/2015 2.21* 0.44 - 1.00 mg/dL Final  . Calcium 12/20/2015 8.7* 8.9 - 10.3 mg/dL Final  . Total Protein 12/20/2015 6.6  6.5 - 8.1 g/dL Final  . Albumin 12/20/2015 2.6* 3.5 - 5.0 g/dL Final  . AST 12/20/2015 36  15 - 41 U/L Final  . ALT 12/20/2015 28  14 - 54 U/L Final  . Alkaline Phosphatase 12/20/2015 245* 38 - 126 U/L Final  . Total Bilirubin 12/20/2015 0.7  0.3 - 1.2 mg/dL Final  . GFR calc non Af Amer 12/20/2015 19* >60 mL/min Final  . GFR calc Af Amer 12/20/2015 22* >60 mL/min Final   Comment: (NOTE) The eGFR has been calculated using the CKD EPI equation. This calculation has not been validated in all clinical situations. eGFR's persistently <60 mL/min signify possible Chronic Kidney Disease.   . Anion gap 12/20/2015 8  5 - 15 Final  Admission on 12/18/2015, Discharged on 12/18/2015  Component Date Value Ref Range Status  . Glucose-Capillary 12/18/2015 201* 65 - 99 mg/dL Final  . WBC 12/18/2015 10.7  3.6 - 11.0 K/uL Final  . RBC 12/18/2015 3.22* 3.80 - 5.20 MIL/uL Final  . Hemoglobin 12/18/2015 9.7* 12.0 - 16.0 g/dL Final  . HCT 12/18/2015 29.3* 35.0 - 47.0 % Final  . MCV 12/18/2015 90.9  80.0 - 100.0 fL Final  . MCH 12/18/2015 30.0  26.0 - 34.0 pg Final  . MCHC 12/18/2015 33.0  32.0 - 36.0 g/dL Final  . RDW 12/18/2015 16.8* 11.5 - 14.5 % Final  . Platelets 12/18/2015 316  150 - 440 K/uL Final  . Sodium 12/18/2015 139  135 - 145 mmol/L Final  . Potassium 12/18/2015 4.4  3.5 - 5.1 mmol/L Final  . Chloride 12/18/2015 109  101 - 111 mmol/L Final  . CO2 12/18/2015 19* 22 - 32 mmol/L Final  . Glucose, Bld 12/18/2015 243* 65 - 99 mg/dL Final  . BUN 12/18/2015 35* 6 - 20 mg/dL Final  . Creatinine, Ser 12/18/2015 2.88* 0.44 - 1.00 mg/dL Final  . Calcium  12/18/2015 8.3* 8.9 - 10.3 mg/dL Final  . GFR calc non Af Amer 12/18/2015 14* >60 mL/min Final  . GFR calc Af Amer 12/18/2015 16* >60 mL/min Final   Comment: (NOTE) The eGFR has been calculated using the CKD EPI equation. This calculation has not been validated in all clinical situations. eGFR's persistently <60 mL/min signify possible Chronic Kidney Disease.   . Anion gap 12/18/2015 11  5 - 15 Final  . Color, Urine 12/18/2015 YELLOW* YELLOW Final  . APPearance 12/18/2015 CLEAR* CLEAR Final  . Glucose, UA 12/18/2015 NEGATIVE  NEGATIVE mg/dL Final  . Bilirubin Urine 12/18/2015 NEGATIVE  NEGATIVE Final  . Ketones, ur 12/18/2015 NEGATIVE  NEGATIVE mg/dL Final  . Specific Gravity, Urine 12/18/2015 1.015  1.005 - 1.030 Final  . Hgb urine dipstick 12/18/2015 NEGATIVE  NEGATIVE Final  . pH 12/18/2015 5.0  5.0 - 8.0 Final  . Protein, ur 12/18/2015 NEGATIVE  NEGATIVE mg/dL Final  . Nitrite 12/18/2015 NEGATIVE  NEGATIVE Final  . Leukocytes, UA 12/18/2015 NEGATIVE  NEGATIVE Final  . RBC / HPF 12/18/2015 0-5  0 - 5 RBC/hpf Final  . WBC, UA 12/18/2015 0-5  0 - 5 WBC/hpf Final  . Bacteria, UA 12/18/2015 RARE* NONE SEEN Final  . Squamous Epithelial /  LPF 12/18/2015 0-5* NONE SEEN Final  . Mucous 12/18/2015 PRESENT   Final  . Hyaline Casts, UA 12/18/2015 PRESENT   Final       STUDIES: Dg Chest 2 View  12/18/2015  CLINICAL DATA:  Syncope.  History of breast and uterine carcinoma EXAM: CHEST  2 VIEW COMPARISON:  November 19, 2015. FINDINGS: Port-A-Cath tip is in the superior vena cava. No pneumothorax. There is no edema or consolidation. Heart size and pulmonary vascularity are normal. No adenopathy. No bone lesions. IMPRESSION: No edema or consolidation. Port-A-Cath tip in superior vena cava. No pneumothorax. Electronically Signed   By: Lowella Grip III M.D.   On: 12/18/2015 13:41   Dg C-arm 1-60 Min-no Report  11/22/2015  CLINICAL DATA: ercp, replace stent C-ARM 1-60 MINUTES Fluoroscopy was  utilized by the requesting physician.  No radiographic interpretation.    ASSESSMENT: Carcinoma of head of pancreas positive for adenocarcinoma EUS staging is T2 N0 M0 Multiple comorbid condition Renal failure will preclude any other evaluation for involvement of SMA.  We will start patient on 5-FU by continuous infusion initially for 5 days In my absence patient would be evaluated by my associate.  If tolerated then it can be increased to 7 days of continuous infusion. All lab data has been reviewed Continue 5-FU over 5 days. Continue radiation therapy Patient has requested appointment with local nephrology practice those of transportation issues.  [facility]   Patient will continue one more week of 5-FU chemotherapy and then will be reevaluated with CT scan without contrast on a PET scan 3 months from finishing radiation therapy  Patient expressed understanding and was in agreement with this plan. She also understands that She can call clinic at any time with any questions, concerns, or complaints.    No matching staging information was found for the patient.  Forest Gleason, MD   12/20/2015 10:12 AM

## 2015-12-21 ENCOUNTER — Ambulatory Visit
Admission: RE | Admit: 2015-12-21 | Discharge: 2015-12-21 | Disposition: A | Discharge status: Home / Self Care | Payer: Medicare Other | Source: Ambulatory Visit | Attending: Radiation Oncology | Admitting: Radiation Oncology

## 2015-12-21 ENCOUNTER — Encounter: Payer: Self-pay | Admitting: Oncology

## 2015-12-21 ENCOUNTER — Ambulatory Visit: Payer: Medicare Other

## 2015-12-21 DIAGNOSIS — C250 Malignant neoplasm of head of pancreas: Secondary | ICD-10-CM | POA: Diagnosis not present

## 2015-12-24 ENCOUNTER — Ambulatory Visit
Admission: RE | Admit: 2015-12-24 | Discharge: 2015-12-24 | Disposition: A | Discharge status: Home / Self Care | Payer: Medicare Other | Source: Ambulatory Visit | Attending: Radiation Oncology | Admitting: Radiation Oncology

## 2015-12-24 DIAGNOSIS — C250 Malignant neoplasm of head of pancreas: Secondary | ICD-10-CM | POA: Diagnosis not present

## 2015-12-25 ENCOUNTER — Ambulatory Visit
Admission: RE | Admit: 2015-12-25 | Discharge: 2015-12-25 | Disposition: A | Discharge status: Home / Self Care | Payer: Medicare Other | Source: Ambulatory Visit | Attending: Radiation Oncology | Admitting: Radiation Oncology

## 2015-12-25 ENCOUNTER — Inpatient Hospital Stay: Payer: Medicare Other

## 2015-12-25 VITALS — BP 107/58 | HR 70

## 2015-12-25 DIAGNOSIS — C801 Malignant (primary) neoplasm, unspecified: Secondary | ICD-10-CM

## 2015-12-25 DIAGNOSIS — C250 Malignant neoplasm of head of pancreas: Secondary | ICD-10-CM | POA: Diagnosis not present

## 2015-12-25 MED ORDER — SODIUM CHLORIDE 0.9% FLUSH
10.0000 mL | INTRAVENOUS | Status: DC | PRN
  Administered 2015-12-25: 10 mL via INTRAVENOUS
  Filled 2015-12-25: qty 10

## 2015-12-25 MED ORDER — HEPARIN SOD (PORK) LOCK FLUSH 100 UNIT/ML IV SOLN
500.0000 [IU] | Freq: Once | INTRAVENOUS | Status: AC
  Administered 2015-12-25: 500 [IU] via INTRAVENOUS

## 2015-12-25 NOTE — Progress Notes (Signed)
Patients caregiver stated that she had been feeling like she had low energy and was not eating or drinking much.  Patient encouraged to drink more fluid.   Patient did speak to Dr. Donella Stade regarding apetitie and constipation, miralax Rx given to patient.  Vial signs checked and pump removed.

## 2015-12-26 ENCOUNTER — Ambulatory Visit
Admission: RE | Admit: 2015-12-26 | Discharge: 2015-12-26 | Disposition: A | Discharge status: Home / Self Care | Payer: Medicare Other | Source: Ambulatory Visit | Attending: Radiation Oncology | Admitting: Radiation Oncology

## 2015-12-26 DIAGNOSIS — C250 Malignant neoplasm of head of pancreas: Secondary | ICD-10-CM | POA: Diagnosis not present

## 2015-12-27 ENCOUNTER — Ambulatory Visit
Admission: RE | Admit: 2015-12-27 | Discharge: 2015-12-27 | Disposition: A | Discharge status: Home / Self Care | Payer: Medicare Other | Source: Ambulatory Visit | Attending: Radiation Oncology | Admitting: Radiation Oncology

## 2015-12-27 ENCOUNTER — Inpatient Hospital Stay (HOSPITAL_BASED_OUTPATIENT_CLINIC_OR_DEPARTMENT_OTHER): Payer: Medicare Other | Admitting: Oncology

## 2015-12-27 ENCOUNTER — Ambulatory Visit: Payer: Medicare Other

## 2015-12-27 ENCOUNTER — Inpatient Hospital Stay: Payer: Medicare Other

## 2015-12-27 VITALS — BP 89/61 | HR 69 | Temp 97.0°F | Resp 18

## 2015-12-27 DIAGNOSIS — Z809 Family history of malignant neoplasm, unspecified: Secondary | ICD-10-CM

## 2015-12-27 DIAGNOSIS — Z794 Long term (current) use of insulin: Secondary | ICD-10-CM

## 2015-12-27 DIAGNOSIS — Z79811 Long term (current) use of aromatase inhibitors: Secondary | ICD-10-CM

## 2015-12-27 DIAGNOSIS — C50911 Malignant neoplasm of unspecified site of right female breast: Secondary | ICD-10-CM | POA: Diagnosis not present

## 2015-12-27 DIAGNOSIS — C786 Secondary malignant neoplasm of retroperitoneum and peritoneum: Secondary | ICD-10-CM | POA: Diagnosis not present

## 2015-12-27 DIAGNOSIS — C250 Malignant neoplasm of head of pancreas: Secondary | ICD-10-CM

## 2015-12-27 DIAGNOSIS — E119 Type 2 diabetes mellitus without complications: Secondary | ICD-10-CM

## 2015-12-27 DIAGNOSIS — K219 Gastro-esophageal reflux disease without esophagitis: Secondary | ICD-10-CM

## 2015-12-27 DIAGNOSIS — Z7982 Long term (current) use of aspirin: Secondary | ICD-10-CM

## 2015-12-27 DIAGNOSIS — Z79899 Other long term (current) drug therapy: Secondary | ICD-10-CM

## 2015-12-27 DIAGNOSIS — E669 Obesity, unspecified: Secondary | ICD-10-CM

## 2015-12-27 DIAGNOSIS — Z8542 Personal history of malignant neoplasm of other parts of uterus: Secondary | ICD-10-CM

## 2015-12-27 DIAGNOSIS — N189 Chronic kidney disease, unspecified: Secondary | ICD-10-CM

## 2015-12-27 DIAGNOSIS — Z7984 Long term (current) use of oral hypoglycemic drugs: Secondary | ICD-10-CM

## 2015-12-27 DIAGNOSIS — R7989 Other specified abnormal findings of blood chemistry: Secondary | ICD-10-CM

## 2015-12-27 DIAGNOSIS — I129 Hypertensive chronic kidney disease with stage 1 through stage 4 chronic kidney disease, or unspecified chronic kidney disease: Secondary | ICD-10-CM

## 2015-12-27 DIAGNOSIS — Z170 Estrogen receptor positive status [ER+]: Secondary | ICD-10-CM | POA: Diagnosis not present

## 2015-12-27 LAB — CBC WITH DIFFERENTIAL/PLATELET
BASOS PCT: 1 %
Basophils Absolute: 0 10*3/uL (ref 0–0.1)
EOS ABS: 0.1 10*3/uL (ref 0–0.7)
EOS PCT: 1 %
HCT: 27.3 % — ABNORMAL LOW (ref 35.0–47.0)
Hemoglobin: 9.4 g/dL — ABNORMAL LOW (ref 12.0–16.0)
LYMPHS ABS: 0.4 10*3/uL — AB (ref 1.0–3.6)
Lymphocytes Relative: 7 %
MCH: 31.9 pg (ref 26.0–34.0)
MCHC: 34.7 g/dL (ref 32.0–36.0)
MCV: 92 fL (ref 80.0–100.0)
Monocytes Absolute: 0.5 10*3/uL (ref 0.2–0.9)
Monocytes Relative: 9 %
Neutro Abs: 4.3 10*3/uL (ref 1.4–6.5)
Neutrophils Relative %: 82 %
PLATELETS: 393 10*3/uL (ref 150–440)
RBC: 2.96 MIL/uL — AB (ref 3.80–5.20)
RDW: 16.7 % — ABNORMAL HIGH (ref 11.5–14.5)
WBC: 5.3 10*3/uL (ref 3.6–11.0)

## 2015-12-27 LAB — COMPREHENSIVE METABOLIC PANEL
ALBUMIN: 2.6 g/dL — AB (ref 3.5–5.0)
ALT: 19 U/L (ref 14–54)
ANION GAP: 9 (ref 5–15)
AST: 30 U/L (ref 15–41)
Alkaline Phosphatase: 169 U/L — ABNORMAL HIGH (ref 38–126)
BUN: 36 mg/dL — ABNORMAL HIGH (ref 6–20)
CHLORIDE: 105 mmol/L (ref 101–111)
CO2: 23 mmol/L (ref 22–32)
CREATININE: 3.27 mg/dL — AB (ref 0.44–1.00)
Calcium: 8.7 mg/dL — ABNORMAL LOW (ref 8.9–10.3)
GFR calc non Af Amer: 12 mL/min — ABNORMAL LOW (ref >60)
GFR, EST AFRICAN AMERICAN: 14 mL/min — AB (ref >60)
Glucose, Bld: 155 mg/dL — ABNORMAL HIGH (ref 65–99)
Potassium: 5.6 mmol/L — ABNORMAL HIGH (ref 3.5–5.1)
SODIUM: 137 mmol/L (ref 135–145)
Total Bilirubin: 0.6 mg/dL (ref 0.3–1.2)
Total Protein: 6.4 g/dL — ABNORMAL LOW (ref 6.5–8.1)

## 2015-12-27 MED ORDER — SODIUM CHLORIDE 0.9% FLUSH
10.0000 mL | INTRAVENOUS | Status: DC | PRN
  Administered 2015-12-27: 10 mL
  Filled 2015-12-27: qty 10

## 2015-12-27 MED ORDER — SODIUM CHLORIDE 0.9% FLUSH
10.0000 mL | Freq: Once | INTRAVENOUS | Status: AC
  Administered 2015-12-27: 10 mL via INTRAVENOUS
  Filled 2015-12-27: qty 10

## 2015-12-27 MED ORDER — SODIUM CHLORIDE 0.9 % IV SOLN
225.0000 mg/m2/d | INTRAVENOUS | Status: DC
  Administered 2015-12-27: 2700 mg via INTRAVENOUS
  Filled 2015-12-27: qty 54

## 2015-12-27 NOTE — Progress Notes (Signed)
Patient has been very alert during the day but at night she is having delusions, (dreams that she thinks are real).  Appetite has been better.  Patient's BP 89/61 HR 69. Patient has been able to move her bowels okay this week also.

## 2015-12-27 NOTE — Progress Notes (Signed)
Pt's creatinine 3.27. Proceed with treatment per Dr Oliva Bustard

## 2015-12-28 ENCOUNTER — Ambulatory Visit
Admission: RE | Admit: 2015-12-28 | Discharge: 2015-12-28 | Disposition: A | Discharge status: Home / Self Care | Payer: Medicare Other | Source: Ambulatory Visit | Attending: Radiation Oncology | Admitting: Radiation Oncology

## 2015-12-28 ENCOUNTER — Encounter: Payer: Self-pay | Admitting: Oncology

## 2015-12-28 DIAGNOSIS — C250 Malignant neoplasm of head of pancreas: Secondary | ICD-10-CM | POA: Diagnosis not present

## 2015-12-28 NOTE — Progress Notes (Signed)
[place] @ [facility] Telephone:(336) [phone]  Fax:([phone]) Mayer: 1929/12/23  MR#: [id number]  JJH#:[id number]  Patient Care Team: [name], MD as PCP - General (Physician Assistant) [name], RN as Registered Nurse  CHIEF COMPLAINT:  Chief Complaint  Patient presents with  . Pancreatic Cancer  1.  Carcinoma of head of pancreas 23 mm millimeter by 17 mm invading superior  MESENTERIC VEIN.  No arterial invasion was found. T2 N0 M0 tumor by EUS criteria.  Biopsy was positive for adenocarcinoma. (February, 2017). 2.  Carcinoma of breast diagnosis was in August 25 2011.  T1 cN0 M0 tumor ER/PR positive. 3.  Multiple comorbid condition with chronic renal failure, diabetes, high blood pressure, obesity bilateral knee replacement and poor performance status 4.We will start patient on 5-FU by continuous infusion and radiation therapy (November 08, 2015) and Patient will finish radiation and chemotherapy by May 17 ,2017    No history exists.    Oncology Flowsheet 11/15/2015 11/22/2015 11/29/2015 12/06/2015 12/13/2015 12/20/2015 12/27/2015  Day, Cycle Day 1, Cycle 2 - Day 1, Cycle 3 - - Day 1, Cycle 6 Day 1, Cycle 7  fluorouracil (ADRUCIL) IV 2,700 mg - 225 mg/m2/day 2,700 mg 225 mg/m2/day 225 mg/m2/day 225 mg/m2/day  letrozole (FEMARA) PO - - - - - - -  ondansetron (ZOFRAN) IJ - - - - - - -  ondansetron (ZOFRAN) IV - - - - - - -  ondansetron (ZOFRAN) PO - - - - - - -    INTERVAL HISTORY: 82 year old lady who is in wheelchair.  Patient was admitted in hospital for abdominal pain.  Nausea vomiting diarrhea underwent extensive workup included a noncontrast CT scan which revealed pancreatic mass.  Patient underwent EUS which was positive for adenocarcinoma and was stage TII N0 M0 tumor patient is here for further follow-up and consideration of treatment.  No nausea no vomiting.  Diarrhea has improved.  Patient has multiple comorbid condition.  As mentioned above  [place] bilateral knee replacement, moderate obesity, in wheelchair walking with the help of walker.  Has a chronic renal failure, high blood pressure, diabetes,  Has been evaluated today by Dr. Baruch Gouty, our radiation oncologists and decided to pursue radiation and chemotherapy patient was recently admitted in the hospital with sepsis.  A stent was changed.  No nausea.  No vomiting.  No diarrhea.  Here for further follow-up and treatment consideration. Patient is in assisted living facility Patient's performance status today remains poor.  Has occasional nausea but no soreness in the mouth no diarrhea.  Appetite remains poor Patient had a follow-up with nephrologist with [facility]  system.  Last appointment with nephrology was a year ago   Patient is here for ongoing evaluation and treatment consideration Continuing 5-FU and radiation therapy.  Patient is finishing up radiation therapy and 17th of May Patient has been very alert during the day but at night she is having delusions, (dreams that she thinks are real). Appetite has been better. Patient's BP 89/61 HR 69. Patient has been able to move her bowels okay this week also.   REVIEW OF SYSTEMS:   Gen. status: Patient is here for further evaluation regarding pancreatic adenocarcinoma.   Patient is in wheelchair. Performance status is declining Overall poor performance status because of multiple comorbid condition.   HEENT: Denies any headache no visual disturbances.   Lungs: Shortness of breath on exertion.   Cardiac: No chest pain.   GI: Patient had  nausea vomiting epigastric discomfort diarrhea which is gradually improving.  No hematemesis.  No melena.   Lower extremities no swelling skin: No rash neurological system difficult to examine musculoskeletal system bilateral knee replacement  As per HPI. Otherwise, a complete review of systems is negatve.  PAST MEDICAL HISTORY: Past Medical History  Diagnosis Date  . Hypertension   . GERD  (gastroesophageal reflux disease)   . Diabetes mellitus without complication ([place])   . Breast cancer ([place]) 2013    c Lumpectomy 2013  . Uterine cancer ([place]) 1960  . Pancreatic cancer ([place])   . Abnormal LFTs     PAST SURGICAL HISTORY: Past Surgical History  Procedure Laterality Date  . Abdominal hysterectomy    . Knee arthroscopy    . Ercp N/A 09/04/2015    Procedure: ENDOSCOPIC RETROGRADE CHOLANGIOPANCREATOGRAPHY (ERCP);  Surgeon: Hulen Luster, MD;  Location: [facility] ENDOSCOPY;  Service: Gastroenterology;  Laterality: N/A;  . Breast biopsy Right 2012    Positive  . Upper esophageal endoscopic ultrasound (eus) N/A 10/04/2015    Procedure: UPPER ESOPHAGEAL ENDOSCOPIC ULTRASOUND (EUS);  Surgeon: Holly Bodily, MD;  Location: [name] ENDOSCOPY;  Service: Gastroenterology;  Laterality: N/A;  . Peripheral vascular catheterization N/A 11/05/2015    Procedure: Porta Cath Insertion;  Surgeon: Algernon Huxley, MD;  Location: [place] CV LAB;  Service: Cardiovascular;  Laterality: N/A;  . Ercp N/A 11/22/2015    Procedure: ENDOSCOPIC RETROGRADE CHOLANGIOPANCREATOGRAPHY (ERCP);  Surgeon: Hulen Luster, MD;  Location: [facility] ENDOSCOPY;  Service: Endoscopy;  Laterality: N/A;  For Thursday afternoon    FAMILY HISTORY Family History  Problem Relation Age of Onset  . Cancer Mother   . Heart disease Brother     ADVANCED DIRECTIVES:  No flowsheet data found.  HEALTH MAINTENANCE: Social History  Substance Use Topics  . Smoking status: Never Smoker   . Smokeless tobacco: None  . Alcohol Use: No      Allergies  Allergen Reactions  . Colchicine   . Ketorolac   . Lactase Diarrhea  . Lipitor [Atorvastatin]     Current Outpatient Prescriptions  Medication Sig Dispense Refill  . amLODipine (NORVASC) 10 MG tablet Take 10 mg by mouth daily.    [name] aspirin 81 MG tablet Take 81 mg by mouth daily.    [name] buPROPion (WELLBUTRIN SR) 150 MG 12 hr tablet Take 150 mg by mouth 2 (two) times daily. Reported on  12/06/2015    . febuxostat (ULORIC) 40 MG tablet Take 80 mg by mouth daily.    . furosemide (LASIX) 20 MG tablet Take 20 mg by mouth daily.    [name] glipiZIDE (GLUCOTROL) 5 MG tablet Take 5 mg by mouth daily before breakfast.     . HYDROcodone-acetaminophen (NORCO/VICODIN) 5-325 MG tablet Take 1 tablet by mouth every 6 (six) hours as needed for moderate pain. 20 tablet 0  . insulin glargine (LANTUS) 100 UNIT/ML injection Inject 0.14 mLs (14 Units total) into the skin 2 (two) times daily. 10 mL 0  . letrozole (FEMARA) 2.5 MG tablet Take 2.5 mg by mouth daily.    [name] levothyroxine (SYNTHROID, LEVOTHROID) 75 MCG tablet Take 75 mcg by mouth daily before breakfast.    . lisinopril (PRINIVIL,ZESTRIL) 40 MG tablet Take 40 mg by mouth daily. Reported on 12/06/2015    . loperamide (IMODIUM) 2 MG capsule Reported on 12/06/2015  0  . metoprolol succinate (TOPROL-XL) 100 MG 24 hr tablet Take 100 mg by mouth daily. Take with or immediately following a  meal.    . omeprazole (PRILOSEC) 20 MG capsule Take 20 mg by mouth daily.    . potassium chloride 20 MEQ TBCR Take 20 mEq by mouth 2 (two) times daily. 30 tablet 0  . pravastatin (PRAVACHOL) 40 MG tablet Take 40 mg by mouth daily.    . promethazine (PHENERGAN) 25 MG tablet   1   No current facility-administered medications for this visit.   Facility-Administered Medications Ordered in Other Visits  Medication Dose Route Frequency Provider Last Rate Last Dose  . 0.9 %  sodium chloride infusion   Intravenous Continuous Forest Gleason, MD   Stopped at 11/08/15 1144  . heparin lock flush 100 unit/mL  500 Units Intracatheter Once PRN Forest Gleason, MD      . heparin lock flush 100 unit/mL  500 Units Intravenous Once Forest Gleason, MD      . sodium chloride flush (NS) 0.9 % injection 10 mL  10 mL Intracatheter PRN Forest Gleason, MD   10 mL at 11/08/15 1052  . sodium chloride flush (NS) 0.9 % injection 10 mL  10 mL Intravenous PRN Forest Gleason, MD        OBJECTIVE:  Filed  Vitals:   12/27/15 1015  BP: 89/61  Pulse: 69  Temp: 97 F (36.1 C)  Resp: 18     There is no weight on file to calculate BMI.    ECOG FS:2 - Symptomatic, <50% confined to bed  PHYSICAL EXAM: Patient is alert oriented not any acute distress.  Moderate obesity.  Patient is confined to the wheelchair. Appears to be somewhat depressed Lymphatic system: Supraclavicular, cervical, axillary, inguinal lymph nodes are not palpable. Lungs: Diminished air entry on both sides. Cardiac: Soft systolic murmur. Abdomen: Difficult to examine protuberant abdomen no ascites tenderness in epigastric area skin: No rash Neurological system: Complete examination is difficult.  There is no localizing signs HEENT: No abnormality detected Patient's ambulation is limited. All other systems have been examined.   LAB RESULTS:  CBC Latest Ref Rng 12/27/2015 12/20/2015  WBC 3.6 - 11.0 K/uL 5.3 10.7  Hemoglobin 12.0 - 16.0 g/dL 9.4(L) 10.0(L)  Hematocrit 35.0 - 47.0 % 27.3(L) 29.4(L)  Platelets 150 - 440 K/uL 393 407    Infusion on 12/27/2015  Component Date Value Ref Range Status  . WBC 12/27/2015 5.3  3.6 - 11.0 K/uL Final  . RBC 12/27/2015 2.96* 3.80 - 5.20 MIL/uL Final  . Hemoglobin 12/27/2015 9.4* 12.0 - 16.0 g/dL Final  . HCT 12/27/2015 27.3* 35.0 - 47.0 % Final  . MCV 12/27/2015 92.0  80.0 - 100.0 fL Final  . MCH 12/27/2015 31.9  26.0 - 34.0 pg Final  . MCHC 12/27/2015 34.7  32.0 - 36.0 g/dL Final  . RDW 12/27/2015 16.7* 11.5 - 14.5 % Final  . Platelets 12/27/2015 393  150 - 440 K/uL Final  . Neutrophils Relative % 12/27/2015 82   Final  . Neutro Abs 12/27/2015 4.3  1.4 - 6.5 K/uL Final  . Lymphocytes Relative 12/27/2015 7   Final  . Lymphs Abs 12/27/2015 0.4* 1.0 - 3.6 K/uL Final  . Monocytes Relative 12/27/2015 9   Final  . Monocytes Absolute 12/27/2015 0.5  0.2 - 0.9 K/uL Final  . Eosinophils Relative 12/27/2015 1   Final  . Eosinophils Absolute 12/27/2015 0.1  0 - 0.7 K/uL Final  .  Basophils Relative 12/27/2015 1   Final  . Basophils Absolute 12/27/2015 0.0  0 - 0.1 K/uL Final  . Sodium 12/27/2015 137  135 - 145 mmol/L Final  . Potassium 12/27/2015 5.6* 3.5 - 5.1 mmol/L Final  . Chloride 12/27/2015 105  101 - 111 mmol/L Final  . CO2 12/27/2015 23  22 - 32 mmol/L Final  . Glucose, Bld 12/27/2015 155* 65 - 99 mg/dL Final  . BUN 12/27/2015 36* 6 - 20 mg/dL Final  . Creatinine, Ser 12/27/2015 3.27* 0.44 - 1.00 mg/dL Final  . Calcium 12/27/2015 8.7* 8.9 - 10.3 mg/dL Final  . Total Protein 12/27/2015 6.4* 6.5 - 8.1 g/dL Final  . Albumin 12/27/2015 2.6* 3.5 - 5.0 g/dL Final  . AST 12/27/2015 30  15 - 41 U/L Final  . ALT 12/27/2015 19  14 - 54 U/L Final  . Alkaline Phosphatase 12/27/2015 169* 38 - 126 U/L Final  . Total Bilirubin 12/27/2015 0.6  0.3 - 1.2 mg/dL Final  . GFR calc non Af Amer 12/27/2015 12* >60 mL/min Final  . GFR calc Af Amer 12/27/2015 14* >60 mL/min Final   Comment: (NOTE) The eGFR has been calculated using the CKD EPI equation. This calculation has not been validated in all clinical situations. eGFR's persistently <60 mL/min signify possible Chronic Kidney Disease.   . Anion gap 12/27/2015 9  5 - 15 Final       STUDIES: Dg Chest 2 View  12/18/2015  CLINICAL DATA:  Syncope.  History of breast and uterine carcinoma EXAM: CHEST  2 VIEW COMPARISON:  November 19, 2015. FINDINGS: Port-A-Cath tip is in the superior vena cava. No pneumothorax. There is no edema or consolidation. Heart size and pulmonary vascularity are normal. No adenopathy. No bone lesions. IMPRESSION: No edema or consolidation. Port-A-Cath tip in superior vena cava. No pneumothorax. Electronically Signed   By: Lowella Grip III M.D.   On: 12/18/2015 13:41    ASSESSMENT: Carcinoma of head of pancreas positive for adenocarcinoma EUS staging is T2 N0 M0 Multiple comorbid condition Renal failure will preclude any other evaluation for involvement of SMA.  We will start patient on 5-FU by  continuous infusion initially for 5 days In my absence patient would be evaluated by my associate.  If tolerated then it can be increased to 7 days of continuous infusion. All lab data has been reviewed Continue 5-FU over 5 days. Continue radiation therapy Patient has requested appointment with local nephrology practice those of transportation issues.  Patient has been referred to [facility] kidney associate.  Appointment is on first June, 2017 Patient will continue one more week of 5-FU chemotherapy and then will be reevaluated with CT scan without contrast on a PET scan 3 months from finishing radiation therapy No significant side effect of chemotherapy at present time  Patient has some hallucination and an exact etiology is not clear It is most likely due to metabolic factors Patient expressed understanding and was in agreement with this plan. She also understands that She can call clinic at any time with any questions, concerns, or complaints.    No matching staging information was found for the patient.  Forest Gleason, MD   12/28/2015 11:31 AM

## 2015-12-31 ENCOUNTER — Ambulatory Visit
Admission: RE | Admit: 2015-12-31 | Discharge: 2015-12-31 | Disposition: A | Discharge status: Home / Self Care | Payer: Medicare Other | Source: Ambulatory Visit | Attending: Radiation Oncology | Admitting: Radiation Oncology

## 2015-12-31 DIAGNOSIS — C250 Malignant neoplasm of head of pancreas: Secondary | ICD-10-CM | POA: Diagnosis not present

## 2016-01-01 ENCOUNTER — Inpatient Hospital Stay: Payer: Medicare Other

## 2016-01-01 ENCOUNTER — Ambulatory Visit
Admission: RE | Admit: 2016-01-01 | Discharge: 2016-01-01 | Disposition: A | Discharge status: Home / Self Care | Payer: Medicare Other | Source: Ambulatory Visit | Attending: Radiation Oncology | Admitting: Radiation Oncology

## 2016-01-01 DIAGNOSIS — C250 Malignant neoplasm of head of pancreas: Secondary | ICD-10-CM | POA: Diagnosis not present

## 2016-01-01 DIAGNOSIS — C801 Malignant (primary) neoplasm, unspecified: Secondary | ICD-10-CM

## 2016-01-01 MED ORDER — SODIUM CHLORIDE 0.9% FLUSH
10.0000 mL | INTRAVENOUS | Status: DC | PRN
  Administered 2016-01-01: 10 mL via INTRAVENOUS
  Filled 2016-01-01: qty 10

## 2016-01-01 MED ORDER — HEPARIN SOD (PORK) LOCK FLUSH 100 UNIT/ML IV SOLN
500.0000 [IU] | Freq: Once | INTRAVENOUS | Status: AC
  Administered 2016-01-01: 500 [IU] via INTRAVENOUS

## 2016-01-01 MED ORDER — HEPARIN SOD (PORK) LOCK FLUSH 100 UNIT/ML IV SOLN
INTRAVENOUS | Status: AC
  Filled 2016-01-01: qty 5

## 2016-01-02 ENCOUNTER — Ambulatory Visit: Payer: Medicare Other

## 2016-01-03 ENCOUNTER — Ambulatory Visit: Payer: Medicare Other

## 2016-01-04 ENCOUNTER — Ambulatory Visit: Payer: Medicare Other

## 2016-01-05 ENCOUNTER — Emergency Department: Payer: Medicare Other

## 2016-01-05 ENCOUNTER — Encounter: Payer: Self-pay | Admitting: Emergency Medicine

## 2016-01-05 ENCOUNTER — Inpatient Hospital Stay
Admission: EM | Admit: 2016-01-05 | Discharge: 2016-01-11 | DRG: 871 | Disposition: A | Discharge status: Skilled Nursing Facility | Payer: Medicare Other | Attending: Internal Medicine | Admitting: Internal Medicine

## 2016-01-05 ENCOUNTER — Inpatient Hospital Stay: Payer: Medicare Other

## 2016-01-05 DIAGNOSIS — N179 Acute kidney failure, unspecified: Secondary | ICD-10-CM | POA: Insufficient documentation

## 2016-01-05 DIAGNOSIS — Z853 Personal history of malignant neoplasm of breast: Secondary | ICD-10-CM

## 2016-01-05 DIAGNOSIS — Z85118 Personal history of other malignant neoplasm of bronchus and lung: Secondary | ICD-10-CM | POA: Diagnosis not present

## 2016-01-05 DIAGNOSIS — E1122 Type 2 diabetes mellitus with diabetic chronic kidney disease: Secondary | ICD-10-CM | POA: Diagnosis present

## 2016-01-05 DIAGNOSIS — E872 Acidosis, unspecified: Secondary | ICD-10-CM

## 2016-01-05 DIAGNOSIS — G8929 Other chronic pain: Secondary | ICD-10-CM | POA: Diagnosis present

## 2016-01-05 DIAGNOSIS — Z8249 Family history of ischemic heart disease and other diseases of the circulatory system: Secondary | ICD-10-CM | POA: Diagnosis not present

## 2016-01-05 DIAGNOSIS — E039 Hypothyroidism, unspecified: Secondary | ICD-10-CM | POA: Diagnosis present

## 2016-01-05 DIAGNOSIS — M25559 Pain in unspecified hip: Secondary | ICD-10-CM | POA: Diagnosis present

## 2016-01-05 DIAGNOSIS — A409 Streptococcal sepsis, unspecified: Secondary | ICD-10-CM | POA: Diagnosis present

## 2016-01-05 DIAGNOSIS — Z66 Do not resuscitate: Secondary | ICD-10-CM | POA: Diagnosis present

## 2016-01-05 DIAGNOSIS — C250 Malignant neoplasm of head of pancreas: Secondary | ICD-10-CM | POA: Diagnosis present

## 2016-01-05 DIAGNOSIS — E43 Unspecified severe protein-calorie malnutrition: Secondary | ICD-10-CM | POA: Diagnosis present

## 2016-01-05 DIAGNOSIS — R6521 Severe sepsis with septic shock: Secondary | ICD-10-CM | POA: Diagnosis not present

## 2016-01-05 DIAGNOSIS — I959 Hypotension, unspecified: Secondary | ICD-10-CM | POA: Insufficient documentation

## 2016-01-05 DIAGNOSIS — Z794 Long term (current) use of insulin: Secondary | ICD-10-CM | POA: Diagnosis not present

## 2016-01-05 DIAGNOSIS — R7881 Bacteremia: Secondary | ICD-10-CM | POA: Diagnosis not present

## 2016-01-05 DIAGNOSIS — N189 Chronic kidney disease, unspecified: Secondary | ICD-10-CM | POA: Diagnosis not present

## 2016-01-05 DIAGNOSIS — Z8542 Personal history of malignant neoplasm of other parts of uterus: Secondary | ICD-10-CM

## 2016-01-05 DIAGNOSIS — E860 Dehydration: Secondary | ICD-10-CM | POA: Diagnosis present

## 2016-01-05 DIAGNOSIS — R630 Anorexia: Secondary | ICD-10-CM | POA: Insufficient documentation

## 2016-01-05 DIAGNOSIS — A419 Sepsis, unspecified organism: Secondary | ICD-10-CM | POA: Diagnosis present

## 2016-01-05 DIAGNOSIS — E11649 Type 2 diabetes mellitus with hypoglycemia without coma: Secondary | ICD-10-CM | POA: Diagnosis present

## 2016-01-05 DIAGNOSIS — A4189 Other specified sepsis: Secondary | ICD-10-CM | POA: Diagnosis present

## 2016-01-05 DIAGNOSIS — D638 Anemia in other chronic diseases classified elsewhere: Secondary | ICD-10-CM | POA: Diagnosis present

## 2016-01-05 DIAGNOSIS — Z809 Family history of malignant neoplasm, unspecified: Secondary | ICD-10-CM

## 2016-01-05 DIAGNOSIS — Z515 Encounter for palliative care: Secondary | ICD-10-CM | POA: Diagnosis not present

## 2016-01-05 DIAGNOSIS — G9341 Metabolic encephalopathy: Secondary | ICD-10-CM | POA: Diagnosis present

## 2016-01-05 DIAGNOSIS — R52 Pain, unspecified: Secondary | ICD-10-CM

## 2016-01-05 DIAGNOSIS — Z79899 Other long term (current) drug therapy: Secondary | ICD-10-CM

## 2016-01-05 DIAGNOSIS — Z6835 Body mass index (BMI) 35.0-35.9, adult: Secondary | ICD-10-CM | POA: Diagnosis not present

## 2016-01-05 DIAGNOSIS — R1084 Generalized abdominal pain: Secondary | ICD-10-CM | POA: Diagnosis not present

## 2016-01-05 DIAGNOSIS — M109 Gout, unspecified: Secondary | ICD-10-CM | POA: Diagnosis present

## 2016-01-05 DIAGNOSIS — N184 Chronic kidney disease, stage 4 (severe): Secondary | ICD-10-CM | POA: Diagnosis present

## 2016-01-05 DIAGNOSIS — Z888 Allergy status to other drugs, medicaments and biological substances status: Secondary | ICD-10-CM | POA: Diagnosis not present

## 2016-01-05 DIAGNOSIS — R627 Adult failure to thrive: Secondary | ICD-10-CM | POA: Diagnosis present

## 2016-01-05 DIAGNOSIS — E785 Hyperlipidemia, unspecified: Secondary | ICD-10-CM | POA: Diagnosis present

## 2016-01-05 DIAGNOSIS — E669 Obesity, unspecified: Secondary | ICD-10-CM | POA: Diagnosis present

## 2016-01-05 DIAGNOSIS — N170 Acute kidney failure with tubular necrosis: Secondary | ICD-10-CM | POA: Diagnosis present

## 2016-01-05 DIAGNOSIS — Z7982 Long term (current) use of aspirin: Secondary | ICD-10-CM | POA: Diagnosis not present

## 2016-01-05 DIAGNOSIS — L89151 Pressure ulcer of sacral region, stage 1: Secondary | ICD-10-CM | POA: Diagnosis present

## 2016-01-05 DIAGNOSIS — C259 Malignant neoplasm of pancreas, unspecified: Secondary | ICD-10-CM | POA: Diagnosis not present

## 2016-01-05 DIAGNOSIS — I129 Hypertensive chronic kidney disease with stage 1 through stage 4 chronic kidney disease, or unspecified chronic kidney disease: Secondary | ICD-10-CM | POA: Diagnosis present

## 2016-01-05 DIAGNOSIS — K219 Gastro-esophageal reflux disease without esophagitis: Secondary | ICD-10-CM | POA: Diagnosis present

## 2016-01-05 DIAGNOSIS — R109 Unspecified abdominal pain: Secondary | ICD-10-CM | POA: Diagnosis present

## 2016-01-05 HISTORY — DX: Gout, unspecified: M10.9

## 2016-01-05 HISTORY — DX: Hyperlipidemia, unspecified: E78.5

## 2016-01-05 HISTORY — DX: Disorder of thyroid, unspecified: E07.9

## 2016-01-05 LAB — GLUCOSE, CAPILLARY
Glucose-Capillary: 195 mg/dL — ABNORMAL HIGH (ref 65–99)
Glucose-Capillary: 174 mg/dL — ABNORMAL HIGH (ref 65–99)
Glucose-Capillary: 205 mg/dL — ABNORMAL HIGH (ref 65–99)
Glucose-Capillary: 239 mg/dL — ABNORMAL HIGH (ref 65–99)

## 2016-01-05 LAB — COMPREHENSIVE METABOLIC PANEL
ALT: 18 U/L (ref 14–54)
ANION GAP: 12 (ref 5–15)
AST: 33 U/L (ref 15–41)
Albumin: 2.2 g/dL — ABNORMAL LOW (ref 3.5–5.0)
Alkaline Phosphatase: 156 U/L — ABNORMAL HIGH (ref 38–126)
BILIRUBIN TOTAL: 0.6 mg/dL (ref 0.3–1.2)
BUN: 64 mg/dL — AB (ref 6–20)
CO2: 17 mmol/L — ABNORMAL LOW (ref 22–32)
Calcium: 7.9 mg/dL — ABNORMAL LOW (ref 8.9–10.3)
Chloride: 111 mmol/L (ref 101–111)
Creatinine, Ser: 5.22 mg/dL — ABNORMAL HIGH (ref 0.44–1.00)
GFR, EST AFRICAN AMERICAN: 8 mL/min — AB (ref >60)
GFR, EST NON AFRICAN AMERICAN: 7 mL/min — AB (ref >60)
Glucose, Bld: 199 mg/dL — ABNORMAL HIGH (ref 65–99)
POTASSIUM: 4.7 mmol/L (ref 3.5–5.1)
Sodium: 140 mmol/L (ref 135–145)
TOTAL PROTEIN: 5.4 g/dL — AB (ref 6.5–8.1)

## 2016-01-05 LAB — URINALYSIS COMPLETE WITH MICROSCOPIC (ARMC ONLY)
Bilirubin Urine: NEGATIVE
GLUCOSE, UA: NEGATIVE mg/dL
Ketones, ur: NEGATIVE mg/dL
Nitrite: NEGATIVE
Protein, ur: 100 mg/dL — AB
SPECIFIC GRAVITY, URINE: 1.013 (ref 1.005–1.030)
SQUAMOUS EPITHELIAL / LPF: NONE SEEN
pH: 8 (ref 5.0–8.0)

## 2016-01-05 LAB — CBC WITH DIFFERENTIAL/PLATELET
BASOS ABS: 0.2 10*3/uL — AB (ref 0–0.1)
Basophils Relative: 1 %
Eosinophils Absolute: 0 10*3/uL (ref 0–0.7)
Eosinophils Relative: 0 %
HEMATOCRIT: 28 % — AB (ref 35.0–47.0)
Hemoglobin: 9 g/dL — ABNORMAL LOW (ref 12.0–16.0)
LYMPHS ABS: 0.2 10*3/uL — AB (ref 1.0–3.6)
MCH: 30.6 pg (ref 26.0–34.0)
MCHC: 32.3 g/dL (ref 32.0–36.0)
MCV: 94.9 fL (ref 80.0–100.0)
Monocytes Absolute: 1 10*3/uL — ABNORMAL HIGH (ref 0.2–0.9)
Monocytes Relative: 4 %
Neutro Abs: 23.6 10*3/uL — ABNORMAL HIGH (ref 1.4–6.5)
Platelets: 156 10*3/uL (ref 150–440)
RBC: 2.95 MIL/uL — AB (ref 3.80–5.20)
RDW: 18.3 % — AB (ref 11.5–14.5)
WBC: 25 10*3/uL — AB (ref 3.6–11.0)

## 2016-01-05 LAB — TROPONIN I: TROPONIN I: 0.07 ng/mL — AB (ref <0.031)

## 2016-01-05 LAB — LACTIC ACID, PLASMA
Lactic Acid, Venous: 4.8 mmol/L (ref 0.5–2.0)
Lactic Acid, Venous: 3.4 mmol/L (ref 0.5–2.0)

## 2016-01-05 LAB — MRSA PCR SCREENING: MRSA BY PCR: NEGATIVE

## 2016-01-05 MED ORDER — NOREPINEPHRINE BITARTRATE 1 MG/ML IV SOLN: 0.0000 ug/min | INTRAVENOUS | Status: DC

## 2016-01-05 MED ORDER — SODIUM CHLORIDE 0.9 % IV SOLN
Freq: Once | INTRAVENOUS | Status: AC
  Administered 2016-01-05: 14:00:00 via INTRAVENOUS

## 2016-01-05 MED ORDER — PIPERACILLIN-TAZOBACTAM 3.375 G IVPB
3.3750 g | Freq: Two times a day (BID) | INTRAVENOUS | Status: DC
  Administered 2016-01-05 – 2016-01-09 (×8): 3.375 g via INTRAVENOUS
  Filled 2016-01-05 (×9): qty 50

## 2016-01-05 MED ORDER — ALBUTEROL SULFATE (2.5 MG/3ML) 0.083% IN NEBU: 2.5000 mg | INHALATION_SOLUTION | RESPIRATORY_TRACT | Status: DC | PRN

## 2016-01-05 MED ORDER — ACETAMINOPHEN 325 MG PO TABS
650.0000 mg | ORAL_TABLET | Freq: Four times a day (QID) | ORAL | Status: DC | PRN
  Administered 2016-01-10 – 2016-01-11 (×2): 650 mg via ORAL
  Filled 2016-01-05 (×2): qty 2

## 2016-01-05 MED ORDER — HYDROCODONE-ACETAMINOPHEN 5-325 MG PO TABS
1.0000 | ORAL_TABLET | ORAL | Status: DC | PRN
  Administered 2016-01-06 – 2016-01-07 (×5): 1 via ORAL
  Filled 2016-01-05 (×5): qty 1

## 2016-01-05 MED ORDER — SODIUM CHLORIDE 0.9% FLUSH
3.0000 mL | Freq: Two times a day (BID) | INTRAVENOUS | Status: DC
  Administered 2016-01-05 – 2016-01-11 (×12): 3 mL via INTRAVENOUS

## 2016-01-05 MED ORDER — NOREPINEPHRINE 4 MG/250ML-% IV SOLN
0.0000 ug/min | INTRAVENOUS | Status: DC
  Administered 2016-01-06: 10 ug/min via INTRAVENOUS
  Administered 2016-01-06: 5 ug/min via INTRAVENOUS
  Administered 2016-01-06: 8 ug/min via INTRAVENOUS
  Filled 2016-01-05 (×3): qty 250

## 2016-01-05 MED ORDER — NOREPINEPHRINE 4 MG/250ML-% IV SOLN
0.0000 ug/min | INTRAVENOUS | Status: DC
  Administered 2016-01-05: 2 ug/min via INTRAVENOUS
  Filled 2016-01-05: qty 250

## 2016-01-05 MED ORDER — POLYETHYLENE GLYCOL 3350 17 G PO PACK: 17.0000 g | PACK | Freq: Every day | ORAL | Status: DC | PRN

## 2016-01-05 MED ORDER — DEXTROSE-NACL 5-0.9 % IV SOLN: INTRAVENOUS | Status: DC

## 2016-01-05 MED ORDER — PIPERACILLIN-TAZOBACTAM 3.375 G IVPB
3.3750 g | Freq: Once | INTRAVENOUS | Status: AC
  Administered 2016-01-05: 3.375 g via INTRAVENOUS
  Filled 2016-01-05: qty 50

## 2016-01-05 MED ORDER — VANCOMYCIN HCL IN DEXTROSE 1-5 GM/200ML-% IV SOLN
1000.0000 mg | Freq: Once | INTRAVENOUS | Status: AC
  Administered 2016-01-05: 1000 mg via INTRAVENOUS
  Filled 2016-01-05: qty 200

## 2016-01-05 MED ORDER — ACETAMINOPHEN 650 MG RE SUPP: 650.0000 mg | Freq: Four times a day (QID) | RECTAL | Status: DC | PRN

## 2016-01-05 MED ORDER — ONDANSETRON HCL 4 MG/2ML IJ SOLN: 4.0000 mg | Freq: Four times a day (QID) | INTRAMUSCULAR | Status: DC | PRN

## 2016-01-05 MED ORDER — VANCOMYCIN HCL IN DEXTROSE 1-5 GM/200ML-% IV SOLN
1000.0000 mg | INTRAVENOUS | Status: DC
  Administered 2016-01-05: 1000 mg via INTRAVENOUS
  Filled 2016-01-05: qty 200

## 2016-01-05 MED ORDER — HEPARIN SODIUM (PORCINE) 5000 UNIT/ML IJ SOLN
5000.0000 [IU] | Freq: Three times a day (TID) | INTRAMUSCULAR | Status: DC
  Administered 2016-01-05 – 2016-01-11 (×18): 5000 [IU] via SUBCUTANEOUS
  Filled 2016-01-05 (×19): qty 1

## 2016-01-05 MED ORDER — SODIUM CHLORIDE 0.9 % IV SOLN
Freq: Once | INTRAVENOUS | Status: AC
  Administered 2016-01-05: 15:00:00 via INTRAVENOUS

## 2016-01-05 MED ORDER — ONDANSETRON HCL 4 MG PO TABS: 4.0000 mg | ORAL_TABLET | Freq: Four times a day (QID) | ORAL | Status: DC | PRN

## 2016-01-05 MED ORDER — SODIUM CHLORIDE 0.9 % IV SOLN
INTRAVENOUS | Status: DC
  Administered 2016-01-05 – 2016-01-09 (×5): via INTRAVENOUS

## 2016-01-05 NOTE — ED Provider Notes (Signed)
[facility] Emergency Department Provider Note        Time seen: ----------------------------------------- 1:24 PM on 01/05/2016 -----------------------------------------    I have reviewed the triage vital signs and the nursing notes.   HISTORY  Chief Complaint No chief complaint on file.    HPI Pamela Preston is a 82 y.o. female who presents to ER for altered mental status and low blood sugar. EMS reports in route and prehospital she had a blood sugar in the 40s. She was given glucagon and then D50 once IV was established. She has remained lethargic but has started becoming more alert. She was noted to have some EKG changes in route, patient currently denies complaints but is still lethargic on arrival.   Past Medical History  Diagnosis Date  . Hypertension   . GERD (gastroesophageal reflux disease)   . Diabetes mellitus without complication ([place])   . Breast cancer ([place]) 2013    c Lumpectomy 2013  . Uterine cancer ([place]) 1960  . Pancreatic cancer ([place])   . Abnormal LFTs     Patient Active Problem List   Diagnosis Date Noted  . Pressure ulcer 11/21/2015  . Sepsis ([place]) 11/20/2015  . Jaundice 11/20/2015  . Allergic rhinitis 10/16/2015  . Controlled type 2 diabetes mellitus without complication ([place]) [id number]  . Acid reflux 10/16/2015  . Gout 10/16/2015  . H/O malignant neoplasm of breast 10/16/2015  . HLD (hyperlipidemia) 10/16/2015  . BP (high blood pressure) 10/16/2015  . Adult hypothyroidism 10/16/2015  . Anemia, iron deficiency 10/16/2015  . Morbid obesity ([place]) 10/16/2015  . Adiposity 10/16/2015  . Arthritis, degenerative 10/16/2015  . Primary malignant neoplasm of head of pancreas ([place]) 10/11/2015  . Protein-calorie malnutrition, severe 09/02/2015  . Severe protein-calorie malnutrition Altamease Oiler: less than 60% of standard weight) ([place]) 09/02/2015  . Obstructive jaundice 08/31/2015  . Biliary and gallbladder disorder 08/31/2015  .  Abnormal loss of weight 07/26/2015  . D (diarrhea) 07/26/2015  . Abnormal weight loss 07/26/2015  . Type 2 diabetes mellitus ([place]) 03/11/2015  . Right hip pain 01/24/2013  . HTN (hypertension) 01/24/2013  . CKD (chronic kidney disease) stage 5, GFR less than 15 ml/min (HCC) 01/24/2013  . Diabetes mellitus type 2 in obese ([place]) 01/24/2013  . Chronic kidney disease, stage IV (severe) ([place]) 01/24/2013  . Essential (primary) hypertension 01/24/2013  . Arthralgia of hip or thigh 01/24/2013    Past Surgical History  Procedure Laterality Date  . Abdominal hysterectomy    . Knee arthroscopy    . Ercp N/A 09/04/2015    Procedure: ENDOSCOPIC RETROGRADE CHOLANGIOPANCREATOGRAPHY (ERCP);  Surgeon: Hulen Luster, MD;  Location: [facility] ENDOSCOPY;  Service: Gastroenterology;  Laterality: N/A;  . Breast biopsy Right 2012    Positive  . Upper esophageal endoscopic ultrasound (eus) N/A 10/04/2015    Procedure: UPPER ESOPHAGEAL ENDOSCOPIC ULTRASOUND (EUS);  Surgeon: Holly Bodily, MD;  Location: [facility] ENDOSCOPY;  Service: Gastroenterology;  Laterality: N/A;  . Peripheral vascular catheterization N/A 11/05/2015    Procedure: Porta Cath Insertion;  Surgeon: Algernon Huxley, MD;  Location: [place] CV LAB;  Service: Cardiovascular;  Laterality: N/A;  . Ercp N/A 11/22/2015    Procedure: ENDOSCOPIC RETROGRADE CHOLANGIOPANCREATOGRAPHY (ERCP);  Surgeon: Hulen Luster, MD;  Location: [facility] ENDOSCOPY;  Service: Endoscopy;  Laterality: N/A;  For Thursday afternoon    Allergies Colchicine; Ketorolac; Lactase; and Lipitor  Social History Social History  Substance Use Topics  . Smoking status: Never Smoker   . Smokeless tobacco: Not  on file  . Alcohol Use: No    Review of Systems Constitutional: Negative for fever. Eyes: Negative for visual changes. ENT: Negative for sore throat. Cardiovascular: Negative for chest pain. Respiratory: Negative for shortness of breath. Gastrointestinal: Negative for abdominal pain,  vomiting and diarrhea. Genitourinary: Negative for dysuria. Musculoskeletal: Negative for back pain. Skin: Negative for rash. Neurological: Negative for headaches, Positive for weakness  ____________________________________________   PHYSICAL EXAM:  VITAL SIGNS: ED Triage Vitals  Enc Vitals Group     BP --      Pulse --      Resp --      Temp --      Temp src --      SpO2 --      Weight --      Height --      Head Cir --      Peak Flow --      Pain Score --      Pain Loc --      Pain Edu? --      Excl. in [place]? --     Constitutional: Lethargic, responds slowly to questioning Eyes: Conjunctivae are Pale. PERRL. Normal extraocular movements. ENT   Head: Normocephalic and atraumatic.   Nose: No congestion/rhinnorhea.   Mouth/Throat: Mucous membranes are moist.   Neck: No stridor. Cardiovascular: Normal rate, regular rhythm. No murmurs, rubs, or gallops. Respiratory: Normal respiratory effort without tachypnea nor retractions. Breath sounds are clear and equal bilaterally. No wheezes/rales/rhonchi. Gastrointestinal: Soft and nontender. Normal bowel sounds Musculoskeletal: Nontender with limited but unremarkable range of motion Neurologic:  Somewhat slurred speech at this time, no gross focal neurologic deficits are appreciated Skin:  Skin is warm, dry and intact. Pallor is noted ____________________________________________  EKG: Interpreted by me.Sinus rhythm with a rate of 94 bpm, normal PR interval, normal QRS, normal QT interval. Minimal inferior ST elevation without cervical changes.  ____________________________________________  ED COURSE:  Pertinent labs & imaging results that were available during my care of the patient were reviewed by me and considered in my medical decision making (see chart for details). Patient presents to the ER with hypoglycemia and weakness. There are also reported EKG changes. We'll check basic labs and  reevaluate. ____________________________________________    LABS (pertinent positives/negatives)  Labs Reviewed  CBC WITH DIFFERENTIAL/PLATELET - Abnormal; Notable for the following:    WBC 25.0 (*)    RBC 2.95 (*)    Hemoglobin 9.0 (*)    HCT 28.0 (*)    RDW 18.3 (*)    Neutro Abs 23.6 (*)    Lymphs Abs 0.2 (*)    Monocytes Absolute 1.0 (*)    Basophils Absolute 0.2 (*)    All other components within normal limits  COMPREHENSIVE METABOLIC PANEL - Abnormal; Notable for the following:    CO2 17 (*)    Glucose, Bld 199 (*)    BUN 64 (*)    Creatinine, Ser 5.22 (*)    Calcium 7.9 (*)    Total Protein 5.4 (*)    Albumin 2.2 (*)    Alkaline Phosphatase 156 (*)    GFR calc non Af Amer 7 (*)    GFR calc Af Amer 8 (*)    All other components within normal limits  TROPONIN I - Abnormal; Notable for the following:    Troponin I 0.07 (*)    All other components within normal limits  LACTIC ACID, PLASMA - Abnormal; Notable for the following:    Lactic  Acid, Venous 4.8 (*)    All other components within normal limits  GLUCOSE, CAPILLARY - Abnormal; Notable for the following:    Glucose-Capillary 195 (*)    All other components within normal limits  CULTURE, BLOOD (ROUTINE X 2)  CULTURE, BLOOD (ROUTINE X 2)  URINE CULTURE  URINALYSIS COMPLETEWITH MICROSCOPIC ([facility] ONLY)  LACTIC ACID, PLASMA   CRITICAL CARE Performed by: Earleen Newport   Total critical care time: 30 minutes  Critical care time was exclusive of separately billable procedures and treating other patients.  Critical care was necessary to treat or prevent imminent or life-threatening deterioration.  Critical care was time spent personally by me on the following activities: development of treatment plan with patient and/or surrogate as well as nursing, discussions with consultants, evaluation of patient's response to treatment, examination of patient, obtaining history from patient or surrogate, ordering and  performing treatments and interventions, ordering and review of laboratory studies, ordering and review of radiographic studies, pulse oximetry and re-evaluation of patient's condition.   RADIOLOGY Images were viewed by me  Chest x-ray is unremarkable  ____________________________________________  FINAL ASSESSMENT AND PLAN  Altered metal status, hypoglycemia, sepsis, lactic acidosis, Elevated troponin  Plan: Patient with labs and imaging as dictated above. Patient resents to ER with hypoglycemia which is likely secondary to sepsis. She was started on saline infusions as well as broad-spectrum antibiotics sepsis protocol was started. Labs revealed a markedly elevated lactic acid level. I have discussed the EKG with cardiology on call who recommends serial troponins and EKGs. Currently she has no complaints of chest pain or shortness of breath.   Earleen Newport, MD   Note: This dictation was prepared with Dragon dictation. Any transcriptional errors that result from this process are unintentional   Earleen Newport, MD 01/05/16 1450

## 2016-01-05 NOTE — Progress Notes (Signed)
Pharmacy Antibiotic Note  Pamela Preston is a 82 y.o. female admitted on 01/05/2016 with sepsis.  Pharmacy has been consulted for vancomycin and Zosyn dosing.  Plan: Vancomycin 1000 IV every 48 hours. Will need to follow up renal function and adjust dosing/check levels as clinically indicated  Goal trough 15-20 mcg/mL. Zosyn 3.375g IV q12h (4 hour infusion).  Height: 5\' 8"  (172.7 cm) Weight: 228 lb (103.42 kg) IBW/kg (Calculated) : 63.9  Temp (24hrs), Avg:99.5 F (37.5 C), Min:99.5 F (37.5 C), Max:99.5 F (37.5 C)   Recent Labs Lab 01/05/16 1330 01/05/16 1350  WBC 25.0*  --   CREATININE 5.22*  --   LATICACIDVEN  --  4.8*    Estimated Creatinine Clearance: 10.1 mL/min (by C-G formula based on Cr of 5.22).    Allergies  Allergen Reactions  . Colchicine   . Ketorolac   . Lactase Diarrhea  . Lipitor [Atorvastatin]     Antimicrobials this admission: Vancomycin  5/20 >>  Zosyn 5/20 >>   Dose adjustments this admission:   Microbiology results: BCx: pending UCx: pending    Thank you for allowing pharmacy to be a part of this patient's care.  Ulice Dash D 01/05/2016 3:30 PM

## 2016-01-05 NOTE — ED Notes (Signed)
Pt being transferred to CT, pt, family and nurse made aware. Will be transferred to ICU directly after by RN.

## 2016-01-05 NOTE — H&P (Signed)
[place] at [place] NAME: Pamela Preston    MR#:  [id number]  DATE OF BIRTH:  18-Mar-1930  DATE OF ADMISSION:  01/05/2016  PRIMARY CARE PHYSICIAN: [name], MD   REQUESTING/REFERRING PHYSICIAN: Dr. Jimmye Norman  CHIEF COMPLAINT:   Chief Complaint  Patient presents with  . Altered Mental Status    HISTORY OF PRESENT ILLNESS:  Pamela Preston  is a 82 y.o. female with a known history of Diabetes, hypertension, CKD stage IV, obesity, stage IV pancreatic cancer presents to the emergency room brought in from nursing home due to altered mental status and found to have hypoglycemia with blood sugar at 47. Patient was initially given something to eat and then sugar. D50 was given and blood sugar improved to 195 in the emergency room. Patient is unable to contribute to history due to confusion. History obtained from sister at bedside and reviewing old records and emergency room staff. She was recently admitted for cholangitis with Escherichia coli bacteremia. Discharged on ciprofloxacin. Patient improved well with that episode. She continues to go through her chemotherapy and radiation for his stage IV anchor to cancer. Here patient has been found to have hypotension, tachycardia, WBC count of 25,000 with acute renal failure.  PAST MEDICAL HISTORY:   Past Medical History  Diagnosis Date  . Hypertension   . GERD (gastroesophageal reflux disease)   . Diabetes mellitus without complication ([place])   . Breast cancer ([place]) 2013    c Lumpectomy 2013  . Uterine cancer ([place]) 1960  . Pancreatic cancer ([place])   . Abnormal LFTs   . Gout   . Thyroid disease   . Hyperlipidemia     PAST SURGICAL HISTORY:   Past Surgical History  Procedure Laterality Date  . Abdominal hysterectomy    . Knee arthroscopy    . Ercp N/A 09/04/2015    Procedure: ENDOSCOPIC RETROGRADE CHOLANGIOPANCREATOGRAPHY (ERCP);  Surgeon: Hulen Luster, MD;  Location: [facility]  ENDOSCOPY;  Service: Gastroenterology;  Laterality: N/A;  . Breast biopsy Right 2012    Positive  . Upper esophageal endoscopic ultrasound (eus) N/A 10/04/2015    Procedure: UPPER ESOPHAGEAL ENDOSCOPIC ULTRASOUND (EUS);  Surgeon: Holly Bodily, MD;  Location: [facility] ENDOSCOPY;  Service: Gastroenterology;  Laterality: N/A;  . Peripheral vascular catheterization N/A 11/05/2015    Procedure: Porta Cath Insertion;  Surgeon: Algernon Huxley, MD;  Location: [facility] CV LAB;  Service: Cardiovascular;  Laterality: N/A;  . Ercp N/A 11/22/2015    Procedure: ENDOSCOPIC RETROGRADE CHOLANGIOPANCREATOGRAPHY (ERCP);  Surgeon: Hulen Luster, MD;  Location: [facility] ENDOSCOPY;  Service: Endoscopy;  Laterality: N/A;  For Thursday afternoon    SOCIAL HISTORY:   Social History  Substance Use Topics  . Smoking status: Never Smoker   . Smokeless tobacco: Not on file  . Alcohol Use: No    FAMILY HISTORY:   Family History  Problem Relation Age of Onset  . Cancer Mother   . Heart disease Brother     DRUG ALLERGIES:   Allergies  Allergen Reactions  . Colchicine   . Ketorolac   . Lactase Diarrhea  . Lipitor [Atorvastatin]     REVIEW OF SYSTEMS:   Review of Systems  Unable to perform ROS: mental acuity    MEDICATIONS AT HOME:   Prior to Admission medications   Medication Sig Start Date End Date Taking? Authorizing Provider  amLODipine (NORVASC) 10 MG tablet Take 10 mg by mouth daily.    Historical  Provider, MD  aspirin 81 MG tablet Take 81 mg by mouth daily.    Historical Provider, MD  buPROPion (WELLBUTRIN SR) 150 MG 12 hr tablet Take 150 mg by mouth 2 (two) times daily. Reported on 12/06/2015    Historical Provider, MD  febuxostat (ULORIC) 40 MG tablet Take 80 mg by mouth daily.    Historical Provider, MD  furosemide (LASIX) 20 MG tablet Take 20 mg by mouth daily.    Historical Provider, MD  glipiZIDE (GLUCOTROL) 5 MG tablet Take 5 mg by mouth daily before breakfast.     Historical Provider, MD   HYDROcodone-acetaminophen (NORCO/VICODIN) 5-325 MG tablet Take 1 tablet by mouth every 6 (six) hours as needed for moderate pain. 11/24/15   Joselin Crandell, MD  insulin glargine (LANTUS) 100 UNIT/ML injection Inject 0.14 mLs (14 Units total) into the skin 2 (two) times daily. 11/24/15   Hillary Bow, MD  letrozole (FEMARA) 2.5 MG tablet Take 2.5 mg by mouth daily.    Historical Provider, MD  levothyroxine (SYNTHROID, LEVOTHROID) 75 MCG tablet Take 75 mcg by mouth daily before breakfast.    Historical Provider, MD  lisinopril (PRINIVIL,ZESTRIL) 40 MG tablet Take 40 mg by mouth daily. Reported on 12/06/2015    Historical Provider, MD  loperamide (IMODIUM) 2 MG capsule Reported on 12/06/2015 09/07/15   Historical Provider, MD  metoprolol succinate (TOPROL-XL) 100 MG 24 hr tablet Take 100 mg by mouth daily. Take with or immediately following a meal.    Historical Provider, MD  omeprazole (PRILOSEC) 20 MG capsule Take 20 mg by mouth daily.    Historical Provider, MD  potassium chloride 20 MEQ TBCR Take 20 mEq by mouth 2 (two) times daily. 11/15/15   Cammie Sickle, MD  pravastatin (PRAVACHOL) 40 MG tablet Take 40 mg by mouth daily.    Historical Provider, MD  promethazine (PHENERGAN) 25 MG tablet  11/13/15   Historical Provider, MD     VITAL SIGNS:  Blood pressure 84/40, pulse 88, temperature 99.5 F (37.5 C), temperature source Axillary, resp. rate 15, height 5\' 8"  (1.727 m), weight 103.42 kg (228 lb), SpO2 100 %.  PHYSICAL EXAMINATION:  Physical Exam  GENERAL:  82 y.o.-year-old patient lying in the bed. Looks critically ill EYES: Pupils equal, round, reactive to light and accommodation. No scleral icterus. Extraocular muscles intact.  HEENT: Head atraumatic, normocephalic. Oropharynx and nasopharynx clear. No oropharyngeal erythema, moist oral mucosa  NECK:  Supple, no jugular venous distention. No thyroid enlargement, no tenderness.  LUNGS: Normal breath sounds bilaterally, no wheezing, rales,  rhonchi. No use of accessory muscles of respiration.  CARDIOVASCULAR: S1, S2 normal. No murmurs, rubs, or gallops.  ABDOMEN: Soft, nondistended. Bowel sounds present. No organomegaly or mass. Tenderness in the upper abdomen. No rigidity or guarding. EXTREMITIES: No pedal edema, cyanosis, or clubbing. + 2 pedal & radial pulses b/l.   NEUROLOGIC: Not following commands. Is able to move all her extremities. PSYCHIATRIC: The patient is Drowsy and confused SKIN: Stage I sacral decubitus ulcer  LABORATORY PANEL:   CBC  Recent Labs Lab 01/05/16 1330  WBC 25.0*  HGB 9.0*  HCT 28.0*  PLT 156   ------------------------------------------------------------------------------------------------------------------  Chemistries   Recent Labs Lab 01/05/16 1330  NA 140  K 4.7  CL 111  CO2 17*  GLUCOSE 199*  BUN 64*  CREATININE 5.22*  CALCIUM 7.9*  AST 33  ALT 18  ALKPHOS 156*  BILITOT 0.6   ------------------------------------------------------------------------------------------------------------------  Cardiac Enzymes  Recent Labs Lab 01/05/16 1330  TROPONINI 0.07*   ------------------------------------------------------------------------------------------------------------------  RADIOLOGY:  Dg Chest 1 View  01/05/2016  CLINICAL DATA:  82 year old female with a history of weakness and possible sepsis EXAM: CHEST 1 VIEW COMPARISON:  12/18/2015 FINDINGS: Cardiomediastinal silhouette unchanged. Port catheter on the right chest wall via right IJ approach, unchanged. Low lung volumes. No confluent airspace disease. No pleural effusion or pneumothorax. IMPRESSION: Negative for acute cardiopulmonary disease. Signed, Dulcy Fanny. Earleen Newport, DO [facility] Radiology Electronically Signed   By: Corrie Mckusick D.O.   On: 01/05/2016 14:52     IMPRESSION AND PLAN:   * Septic shock with unknown source Patient had recent Escherichia coli bacteremia  due to cholangitis. Now her liver function tests look normal. STAT CT scan of the abdomen. Unable to give IV or oral contrast at this time. Bolus normal saline 1 more liter. Start Zosyn and vancomycin. Blood cultures have been sent from the emergency room. Discussed with Dr. Alva Garnet of ICU. We will start levofed. Discussed with sister who is her healthcare power of attorney regarding critical illness and high risk for deterioration and death. Patient is presently DO NOT RESUSCITATE and DO NOT INTUBATE. Aggressive care to see if patient responds to treatment. No response patient will need to be transitioned to comfort care.  * Hypoglycemia likely from poor appetite and sepsis. Presently blood sugars improved to 195 after D50. Continue Accu-Cheks. Will start D5 normal saline.  * Stage IV pancreatic cancer Patient has finished her chemotherapy. Continues to have radiation. Oncology follow-up as outpatient if patient survives this admission.  * Acute renal failure over CKD stage IV Due to septic shock. Monitor input and output. Fluid resuscitation. We'll place a Foley catheter.  * Severe protein calorie malnutrition  * Anemia of chronic disease is stable  * DVT prophylaxis with heparin.  All the records are reviewed and case discussed with ED provider. Management plans discussed with the patient, family and they are in agreement.  CODE STATUS: DNR  TOTAL CRITICAL CARE TIME TAKING CARE OF THIS PATIENT: 45 minutes.   Hillary Bow R M.D on 01/05/2016 at 3:17 PM  Between 7am to 6pm - Pager - [phone]  After 6pm go to [email or web address] - password [email or web address] [place] [facility]  Office  [phone]  CC: Primary care physician; [name], MD  Note: This dictation was prepared with Dragon dictation along with smaller phrase technology. Any transcriptional errors that result from this process are unintentional.

## 2016-01-05 NOTE — ED Notes (Signed)
Pt from [facility].  Staff called EMS today for altered mental status and low blood glucose "in the 40's", which was treated by staff and blood glucose increased to 60's.  BG 46 when EMS arrived, and pt was treated with "1 amp D50%".  Pt repeat BG was 196 per EMS report.  EMS noted STEMI on EKG.  Family reports pt fell last night and "affected R side".   Pt has history of breast cancer and currently has pancreatic cancer, for which she was receiving chemo and radiation.  Pt had recent hospitalization for sepsis.  On arrival to ED, patient was responsive, but very weak.  VSS on arrival on room air.

## 2016-01-06 DIAGNOSIS — R6521 Severe sepsis with septic shock: Secondary | ICD-10-CM

## 2016-01-06 DIAGNOSIS — R7881 Bacteremia: Secondary | ICD-10-CM

## 2016-01-06 DIAGNOSIS — A419 Sepsis, unspecified organism: Secondary | ICD-10-CM

## 2016-01-06 LAB — BLOOD CULTURE ID PANEL (REFLEXED)
Acinetobacter baumannii: NOT DETECTED
Acinetobacter baumannii: NOT DETECTED
CANDIDA ALBICANS: NOT DETECTED
CANDIDA GLABRATA: NOT DETECTED
CANDIDA PARAPSILOSIS: NOT DETECTED
CANDIDA TROPICALIS: NOT DETECTED
CANDIDA TROPICALIS: NOT DETECTED
CARBAPENEM RESISTANCE: NOT DETECTED
Candida albicans: NOT DETECTED
Candida glabrata: NOT DETECTED
Candida krusei: NOT DETECTED
Candida krusei: NOT DETECTED
Candida parapsilosis: NOT DETECTED
Carbapenem resistance: NOT DETECTED
ENTEROBACTER CLOACAE COMPLEX: NOT DETECTED
ENTEROBACTER CLOACAE COMPLEX: NOT DETECTED
ENTEROBACTERIACEAE SPECIES: DETECTED — AB
ENTEROCOCCUS SPECIES: NOT DETECTED
ENTEROCOCCUS SPECIES: NOT DETECTED
ESCHERICHIA COLI: NOT DETECTED
Enterobacteriaceae species: NOT DETECTED
Escherichia coli: NOT DETECTED
HAEMOPHILUS INFLUENZAE: NOT DETECTED
HAEMOPHILUS INFLUENZAE: NOT DETECTED
Klebsiella oxytoca: NOT DETECTED
Klebsiella oxytoca: NOT DETECTED
Klebsiella pneumoniae: NOT DETECTED
Klebsiella pneumoniae: DETECTED — AB
LISTERIA MONOCYTOGENES: NOT DETECTED
Listeria monocytogenes: NOT DETECTED
METHICILLIN RESISTANCE: NOT DETECTED
METHICILLIN RESISTANCE: NOT DETECTED
NEISSERIA MENINGITIDIS: NOT DETECTED
Neisseria meningitidis: NOT DETECTED
PROTEUS SPECIES: NOT DETECTED
PROTEUS SPECIES: NOT DETECTED
PSEUDOMONAS AERUGINOSA: NOT DETECTED
Pseudomonas aeruginosa: NOT DETECTED
SERRATIA MARCESCENS: NOT DETECTED
STAPHYLOCOCCUS AUREUS BCID: NOT DETECTED
STAPHYLOCOCCUS AUREUS BCID: NOT DETECTED
STAPHYLOCOCCUS SPECIES: NOT DETECTED
STREPTOCOCCUS AGALACTIAE: NOT DETECTED
STREPTOCOCCUS PNEUMONIAE: NOT DETECTED
STREPTOCOCCUS PYOGENES: NOT DETECTED
STREPTOCOCCUS PYOGENES: NOT DETECTED
STREPTOCOCCUS SPECIES: DETECTED — AB
Serratia marcescens: NOT DETECTED
Staphylococcus species: NOT DETECTED
Streptococcus agalactiae: NOT DETECTED
Streptococcus pneumoniae: NOT DETECTED
Streptococcus species: DETECTED — AB
VANCOMYCIN RESISTANCE: NOT DETECTED
VANCOMYCIN RESISTANCE: NOT DETECTED

## 2016-01-06 LAB — CBC
HEMATOCRIT: 30.1 % — AB (ref 35.0–47.0)
HEMOGLOBIN: 9.6 g/dL — AB (ref 12.0–16.0)
MCH: 30.3 pg (ref 26.0–34.0)
MCHC: 31.9 g/dL — AB (ref 32.0–36.0)
MCV: 94.8 fL (ref 80.0–100.0)
Platelets: 136 10*3/uL — ABNORMAL LOW (ref 150–440)
RBC: 3.18 MIL/uL — ABNORMAL LOW (ref 3.80–5.20)
RDW: 18.9 % — AB (ref 11.5–14.5)
WBC: 25.3 10*3/uL — ABNORMAL HIGH (ref 3.6–11.0)

## 2016-01-06 LAB — COMPREHENSIVE METABOLIC PANEL
ALK PHOS: 154 U/L — AB (ref 38–126)
ALT: 22 U/L (ref 14–54)
ANION GAP: 9 (ref 5–15)
AST: 29 U/L (ref 15–41)
Albumin: 2.2 g/dL — ABNORMAL LOW (ref 3.5–5.0)
BILIRUBIN TOTAL: 0.8 mg/dL (ref 0.3–1.2)
BUN: 58 mg/dL — AB (ref 6–20)
CALCIUM: 7.6 mg/dL — AB (ref 8.9–10.3)
CO2: 18 mmol/L — ABNORMAL LOW (ref 22–32)
Chloride: 113 mmol/L — ABNORMAL HIGH (ref 101–111)
Creatinine, Ser: 4.01 mg/dL — ABNORMAL HIGH (ref 0.44–1.00)
GFR calc Af Amer: 11 mL/min — ABNORMAL LOW (ref >60)
GFR, EST NON AFRICAN AMERICAN: 9 mL/min — AB (ref >60)
Glucose, Bld: 245 mg/dL — ABNORMAL HIGH (ref 65–99)
POTASSIUM: 4.5 mmol/L (ref 3.5–5.1)
Sodium: 140 mmol/L (ref 135–145)
TOTAL PROTEIN: 5.8 g/dL — AB (ref 6.5–8.1)

## 2016-01-06 LAB — GLUCOSE, CAPILLARY
GLUCOSE-CAPILLARY: 248 mg/dL — AB (ref 65–99)
GLUCOSE-CAPILLARY: 214 mg/dL — AB (ref 65–99)
GLUCOSE-CAPILLARY: 191 mg/dL — AB (ref 65–99)
Glucose-Capillary: 187 mg/dL — ABNORMAL HIGH (ref 65–99)
Glucose-Capillary: 187 mg/dL — ABNORMAL HIGH (ref 65–99)
Glucose-Capillary: 194 mg/dL — ABNORMAL HIGH (ref 65–99)
Glucose-Capillary: 216 mg/dL — ABNORMAL HIGH (ref 65–99)
Glucose-Capillary: 265 mg/dL — ABNORMAL HIGH (ref 65–99)

## 2016-01-06 LAB — TROPONIN I: Troponin I: 0.09 ng/mL — ABNORMAL HIGH (ref <0.031)

## 2016-01-06 LAB — TSH: TSH: 3.401 u[IU]/mL (ref 0.350–4.500)

## 2016-01-06 LAB — AMMONIA

## 2016-01-06 LAB — PROTIME-INR
INR: 1.24
PROTHROMBIN TIME: 15.8 s — AB (ref 11.4–15.0)

## 2016-01-06 MED ORDER — ENSURE ENLIVE PO LIQD
237.0000 mL | Freq: Three times a day (TID) | ORAL | Status: DC
  Administered 2016-01-06 – 2016-01-08 (×8): 237 mL via ORAL

## 2016-01-06 MED ORDER — INSULIN ASPART 100 UNIT/ML ~~LOC~~ SOLN
0.0000 [IU] | Freq: Three times a day (TID) | SUBCUTANEOUS | Status: DC
  Administered 2016-01-06 – 2016-01-09 (×7): 1 [IU] via SUBCUTANEOUS
  Administered 2016-01-10: 2 [IU] via SUBCUTANEOUS
  Administered 2016-01-10 (×2): 1 [IU] via SUBCUTANEOUS
  Filled 2016-01-06 (×5): qty 1
  Filled 2016-01-06: qty 2
  Filled 2016-01-06 (×6): qty 1

## 2016-01-06 NOTE — Progress Notes (Signed)
Pharmacy Antibiotic Note  Pamela Preston is a 82 y.o. female admitted on 01/05/2016 with sepsis.  Pharmacy has been consulted for vancomycin and Zosyn dosing. Vancomycin d/c 5/21 due to culture results.   Plan: Continue Zosyn 3.375g IV q12h (4 hour infusion).  Height: 5\' 8"  (172.7 cm) Weight: 241 lb 2.9 oz (109.4 kg) IBW/kg (Calculated) : 63.9  Temp (24hrs), Avg:98.1 F (36.7 C), Min:96.7 F (35.9 C), Max:99.5 F (37.5 C)   Recent Labs Lab 01/05/16 1330 01/05/16 1350 01/05/16 1708 01/06/16 0527  WBC 25.0*  --   --  25.3*  CREATININE 5.22*  --   --  4.01*  LATICACIDVEN  --  4.8* 3.4*  --     Estimated Creatinine Clearance: 13.5 mL/min (by C-G formula based on Cr of 4.01).    Allergies  Allergen Reactions  . Colchicine Other (See Comments)    Unknown reaction  . Ketorolac Other (See Comments)    Unknown reaction  . Lactase Diarrhea  . Lipitor [Atorvastatin] Other (See Comments)    Unknown reaction    Antimicrobials this admission: Vancomycin  5/20 >> 5/21 Zosyn 5/20 >>   Dose adjustments this admission:   Microbiology results: BCx: Strep species, Klebsiella pneumoniae w/o KPC per BCID UCx: pending  5/20 MRSA PCR: negative   Thank you for allowing pharmacy to be a part of this patient's care.  Napoleon Form 01/06/2016 8:39 AM

## 2016-01-06 NOTE — Progress Notes (Signed)
Pt in stable condition at this time. Pain controlled with Norco twice on my shift. Pt alert and oriented at this time. Pt remains on Levophed. Report given to oncoming RN.

## 2016-01-06 NOTE — Progress Notes (Signed)
PHARMACY - PHYSICIAN COMMUNICATION CRITICAL VALUE ALERT - BLOOD CULTURE IDENTIFICATION (BCID)  Results for orders placed or performed during the hospital encounter of 01/05/16  Blood Culture ID Panel (Reflexed) (Collected: 01/06/2016  8:00 AM)  Result Value Ref Range   Enterococcus species NOT DETECTED NOT DETECTED   Vancomycin resistance NOT DETECTED NOT DETECTED   Listeria monocytogenes NOT DETECTED NOT DETECTED   Staphylococcus species NOT DETECTED NOT DETECTED   Staphylococcus aureus NOT DETECTED NOT DETECTED   Methicillin resistance NOT DETECTED NOT DETECTED   Streptococcus species DETECTED (A) NOT DETECTED   Streptococcus agalactiae NOT DETECTED NOT DETECTED   Streptococcus pneumoniae NOT DETECTED NOT DETECTED   Streptococcus pyogenes NOT DETECTED NOT DETECTED   Acinetobacter baumannii NOT DETECTED NOT DETECTED   Enterobacteriaceae species DETECTED (A) NOT DETECTED   Enterobacter cloacae complex NOT DETECTED NOT DETECTED   Escherichia coli NOT DETECTED NOT DETECTED   Klebsiella oxytoca NOT DETECTED NOT DETECTED   Klebsiella pneumoniae DETECTED (A) NOT DETECTED   Proteus species NOT DETECTED NOT DETECTED   Serratia marcescens NOT DETECTED NOT DETECTED   Carbapenem resistance NOT DETECTED NOT DETECTED   Haemophilus influenzae NOT DETECTED NOT DETECTED   Neisseria meningitidis NOT DETECTED NOT DETECTED   Pseudomonas aeruginosa NOT DETECTED NOT DETECTED   Candida albicans NOT DETECTED NOT DETECTED   Candida glabrata NOT DETECTED NOT DETECTED   Candida krusei NOT DETECTED NOT DETECTED   Candida parapsilosis NOT DETECTED NOT DETECTED   Candida tropicalis NOT DETECTED NOT DETECTED    Name of physician (or Provider) Contacted: Dr. Verdell Carmine  Changes to prescribed antibiotics required: Recommended keeping Zosyn to cover Strep species (not GAS, GBS, or S pneumo) and Klebsiella pneumoniae with no KPC. After discussion with Dr. Verdell Carmine, will continue Zosyn for now.   Napoleon Form 01/06/2016  8:36 AM

## 2016-01-06 NOTE — Progress Notes (Signed)
Patient resting comfortably overnignt levophed down to 35mcg patient BP WDL. See CHL for further assessment.

## 2016-01-06 NOTE — Progress Notes (Addendum)
PHARMACY - PHYSICIAN COMMUNICATION CRITICAL VALUE ALERT - BLOOD CULTURE IDENTIFICATION (BCID)  Results for orders placed or performed during the hospital encounter of 01/05/16  Blood Culture ID Panel (Reflexed) (Collected: 01/05/2016  1:55 PM)  Result Value Ref Range   Enterococcus species NOT DETECTED NOT DETECTED   Vancomycin resistance NOT DETECTED NOT DETECTED   Listeria monocytogenes NOT DETECTED NOT DETECTED   Staphylococcus species NOT DETECTED NOT DETECTED   Staphylococcus aureus NOT DETECTED NOT DETECTED   Methicillin resistance NOT DETECTED NOT DETECTED   Streptococcus species DETECTED (A) NOT DETECTED   Streptococcus agalactiae NOT DETECTED NOT DETECTED   Streptococcus pneumoniae NOT DETECTED NOT DETECTED   Streptococcus pyogenes NOT DETECTED NOT DETECTED   Acinetobacter baumannii NOT DETECTED NOT DETECTED   Enterobacteriaceae species NOT DETECTED NOT DETECTED   Enterobacter cloacae complex NOT DETECTED NOT DETECTED   Escherichia coli NOT DETECTED NOT DETECTED   Klebsiella oxytoca NOT DETECTED NOT DETECTED   Klebsiella pneumoniae NOT DETECTED NOT DETECTED   Proteus species NOT DETECTED NOT DETECTED   Serratia marcescens NOT DETECTED NOT DETECTED   Carbapenem resistance NOT DETECTED NOT DETECTED   Haemophilus influenzae NOT DETECTED NOT DETECTED   Neisseria meningitidis NOT DETECTED NOT DETECTED   Pseudomonas aeruginosa NOT DETECTED NOT DETECTED   Candida albicans NOT DETECTED NOT DETECTED   Candida glabrata NOT DETECTED NOT DETECTED   Candida krusei NOT DETECTED NOT DETECTED   Candida parapsilosis NOT DETECTED NOT DETECTED   Candida tropicalis NOT DETECTED NOT DETECTED    Name of physician (or Provider) Contacted: Tukov  Changes to prescribed antibiotics required: MRSA PCR(-), UA WBC TNTC, urine cx still pending, Recommended d/c vancomycin.  Ahmarion Saraceno S 01/06/2016  6:01 AM

## 2016-01-06 NOTE — Progress Notes (Signed)
[address] at [place] NAME: Pamela Preston    MR#:  [id number]  DATE OF BIRTH:  [birth year]  SUBJECTIVE:   Patient admitted due to altered mental status and sepsis. Also noted to be hypoglycemic. Blood sugar stable presently. Remains on low-dose Levophed presently. Denies any abdominal pain, nausea, vomiting.  REVIEW OF SYSTEMS:    Review of Systems  Constitutional: Negative for fever and chills.  HENT: Negative for congestion and tinnitus.   Eyes: Negative for blurred vision and double vision.  Respiratory: Negative for cough, shortness of breath and wheezing.   Cardiovascular: Negative for chest pain, orthopnea and PND.  Gastrointestinal: Negative for nausea, vomiting, abdominal pain and diarrhea.  Genitourinary: Negative for dysuria and hematuria.  Neurological: Negative for dizziness, sensory change and focal weakness.  All other systems reviewed and are negative.   Nutrition: Regular Tolerating Diet: Yes Tolerating PT: Await Eval.     DRUG ALLERGIES:   Allergies  Allergen Reactions  . Colchicine Other (See Comments)    Unknown reaction  . Ketorolac Other (See Comments)    Unknown reaction  . Lactase Diarrhea  . Lipitor [Atorvastatin] Other (See Comments)    Unknown reaction    VITALS:  Blood pressure 116/52, pulse 83, temperature 96.7 F (35.9 C), temperature source Axillary, resp. rate 15, height 5\' 8"  (1.727 m), weight 109.4 kg (241 lb 2.9 oz), SpO2 100 %.  PHYSICAL EXAMINATION:   Physical Exam  GENERAL:  82 y.o.-year-old obese patient lying in the bed in no acute distress.  EYES: Pupils equal, round, reactive to light and accommodation. No scleral icterus. Extraocular muscles intact.  HEENT: Head atraumatic, normocephalic. Oropharynx and nasopharynx clear.  NECK:  Supple, no jugular venous distention. No thyroid enlargement, no tenderness.  LUNGS: Normal breath sounds bilaterally, no wheezing, rales, rhonchi. No use  of accessory muscles of respiration.  CARDIOVASCULAR: S1, S2 normal. No murmurs, rubs, or gallops.  ABDOMEN: Soft, slightly tender in epigastric area, no rebound rigidity, nondistended. Bowel sounds present. No organomegaly or mass.  EXTREMITIES: No cyanosis, clubbing or edema b/l.    NEUROLOGIC: Cranial nerves II through XII are intact. No focal Motor or sensory deficits b/l.   PSYCHIATRIC: The patient is alert and oriented x 3.  SKIN: No obvious rash, lesion, or ulcer.    LABORATORY PANEL:   CBC  Recent Labs Lab 01/06/16 0527  WBC 25.3*  HGB 9.6*  HCT 30.1*  PLT 136*   ------------------------------------------------------------------------------------------------------------------  Chemistries   Recent Labs Lab 01/06/16 0527  NA 140  K 4.5  CL 113*  CO2 18*  GLUCOSE 245*  BUN 58*  CREATININE 4.01*  CALCIUM 7.6*  AST 29  ALT 22  ALKPHOS 154*  BILITOT 0.8   ------------------------------------------------------------------------------------------------------------------  Cardiac Enzymes  Recent Labs Lab 01/06/16 0527  TROPONINI 0.09*   ------------------------------------------------------------------------------------------------------------------  RADIOLOGY:  Ct Abdomen Pelvis Wo Contrast  01/05/2016  CLINICAL DATA:  Altered mental status, hypoglycemia. Recent hospital admission for cholangitis. Stage IV pancreatic cancer. EXAM: CT ABDOMEN AND PELVIS WITHOUT CONTRAST TECHNIQUE: Multidetector CT imaging of the abdomen and pelvis was performed following the standard protocol without IV contrast. COMPARISON:  CT abdomen dated 11/19/2015. FINDINGS: Lower chest:  Stable mild atelectasis at each lung base. Hepatobiliary: Biliary stent in place, grossly well-positioned traversing the expected area of the ampulla. No significant bile duct dilatation appreciated. Gallbladder is mildly distended but otherwise unremarkable. Expected pneumobilia. Liver partially obscured  by beam hardening artifact from patient's overlying arms, limiting  characterization, but no focal mass or lesion is identified in the liver. Pancreas: Pancreas appears stable.  No peripancreatic fluid. Spleen: Within normal limits in size. Adrenals/Urinary Tract: Adrenal glands appear stable. Stable masses exophytic to the lateral cortices of each kidney, difficult to definitively characterize without IV contrast, but grossly stable compared to multiple prior CTs dating back to 2012 suggesting benignity. No renal stone or hydronephrosis. No ureteral or bladder calculi identified. Bladder appears normal. Stomach/Bowel: Bowel is normal in caliber. No bowel wall thickening or evidence of bowel wall inflammation seen. Stomach is unremarkable. Appendix is normal. Vascular/Lymphatic: Scattered atherosclerotic changes of the normal- caliber abdominal aorta. No enlarged lymph nodes appreciated in the abdomen or pelvis. Reproductive: No mass or other significant abnormality. Other: No free fluid or abscess collection seen. No free intraperitoneal air. Musculoskeletal: Degenerative changes throughout the thoracolumbar spine but no acute osseous abnormality seen. No evidence of osseous metastasis seen. Superficial soft tissues are unremarkable. IMPRESSION: 1. No acute findings. No free fluid or inflammatory change. No free intraperitoneal air. No bowel obstruction. 2. Appropriate positioning of the biliary stent. Associated pneumobilia. 3. Stable mild atelectasis at each lung base. Electronically Signed   By: Franki Cabot M.D.   On: 01/05/2016 17:01   Dg Chest 1 View  01/05/2016  CLINICAL DATA:  82 year old female with a history of weakness and possible sepsis EXAM: CHEST 1 VIEW COMPARISON:  12/18/2015 FINDINGS: Cardiomediastinal silhouette unchanged. Port catheter on the right chest wall via right IJ approach, unchanged. Low lung volumes. No confluent airspace disease. No pleural effusion or pneumothorax. IMPRESSION:  Negative for acute cardiopulmonary disease. Signed, Dulcy Fanny. Earleen Newport, DO [facility] Radiology Electronically Signed   By: Corrie Mckusick D.O.   On: 01/05/2016 14:52     ASSESSMENT AND PLAN:   82 year old female with past medical history of pancreatic cancer, chronic kidney disease stage III, diabetes, history of breast, uterine cancer, hyperlipidemia, GERD, recent admission for Escherichia coli sepsis status post biliary stent placement who presents to the hospital due to altered mental status.  1. Sepsis-source unclear. Patient had a recent admission secondary to Escherichia coli bacteremia and cholangitis. -Her LFTs are currently stable and her CT scan of abdomen pelvis does not show any acute abnormality. -Continue broad-spectrum IV antibiotics with Zosyn. Culture is presently undergoing Klebsiella, Streptococcus. - infectious disease consult in the a.m. Continue supportive care with IV fluids, IV vasopressors, IV antibiotics as mentioned.  2. Altered mental status-metabolic encephalopathy due to sepsis/hypoglycemia. -Much improved as her blood sugars have improved.  3. Hypoglycemia-secondary to sepsis. Improved with dextrose administration.  - follow BS  4. Leukocytosis-secondary to sepsis. -Follow white cell count with IV antibiotic therapy.  5. Pancreatic cancer stage IV-status post recent biliary stent placement. LFTs currently stable. No acute abdominal pain. We'll follow clinically.  6. Acute on chronic renal failure-this is secondary to ATN and sepsis. Baseline creatinine is around 2.2-3 and patient presented with a creatinine of 5.2. Continue IV fluids, and BUN/creatinine improving.  Transfer to floor once off Levophed.     All the records are reviewed and case discussed with Care Management/Social Workerr. Management plans discussed with the patient, family and they are in agreement.  CODE STATUS: DO NOT RESUSCITATE  DVT  Prophylaxis: Ted's & SCD's.   TOTAL TIME TAKING CARE OF THIS PATIENT: 30 minutes.   POSSIBLE D/C IN 2-3 DAYS, DEPENDING ON CLINICAL CONDITION.   Henreitta Leber M.D on 01/06/2016 at 2:21 PM  Between 7am to 6pm -  Pager - [phone]  After 6pm go to [email or web address] - password [email or web address] [facility]  [place] [facility]  Office  [phone]  CC: Primary care physician; [name], MD

## 2016-01-06 NOTE — Consult Note (Signed)
PULMONARY / CRITICAL CARE MEDICINE   Name: Pamela Preston MRN: [id number] DOB: 11/19/1929    ADMISSION DATE:  01/05/2016   CONSULTATION DATE:  01/06/2016  REFERRING MD:  Hospitalist  CHIEF COMPLAINT:  Hypoglycemia and altered mental status  HISTORY OF PRESENT ILLNESS:   82 yo AA female with a PMH of stage IV pancreatic cancer currently on chemo and radiation, ovarian cancer, CKD stage IV, hypertension and diabetes who presented to the ED with altered level of consciousness, fall and hypoglycemia with blood glucose in the 40s. History is obtained from patient's chart as patient is still lethargic. She was treated for hypoglycemia in the field by EMS. EMS also obtained an EKG that suggested a STEMI however, the repeat EKG was unremarkable. At the ED, she continued to be hypotensive. She was given fluid boluses and started on a low dose of levophed. She admitted to the ICU for sepsis of unknown source and septic shock.  PCCM was consulted for further management.   PAST MEDICAL HISTORY :  She  has a past medical history of Hypertension; GERD (gastroesophageal reflux disease); Diabetes mellitus without complication ([place]); Breast cancer ([place]) (2013); Uterine cancer ([place]) (1960); Pancreatic cancer ([place]); Abnormal LFTs; Gout; Thyroid disease; and Hyperlipidemia.  PAST SURGICAL HISTORY: She  has past surgical history that includes Abdominal hysterectomy; Knee arthroscopy; ERCP (N/A, 09/04/2015); Breast biopsy (Right, 2012); Upper esophageal endoscopic ultrasound (eus) (N/A, 10/04/2015); Cardiac catheterization (N/A, 11/05/2015); and ERCP (N/A, 11/22/2015).  Allergies  Allergen Reactions  . Colchicine Other (See Comments)    Unknown reaction  . Ketorolac Other (See Comments)    Unknown reaction  . Lactase Diarrhea  . Lipitor [Atorvastatin] Other (See Comments)    Unknown reaction    Current Facility-Administered Medications on File Prior to Encounter  Medication  . 0.9 %  sodium chloride infusion   . heparin lock flush 100 unit/mL  . heparin lock flush 100 unit/mL  . sodium chloride flush (NS) 0.9 % injection 10 mL  . sodium chloride flush (NS) 0.9 % injection 10 mL   Current Outpatient Prescriptions on File Prior to Encounter  Medication Sig  . amLODipine (NORVASC) 10 MG tablet Take 10 mg by mouth daily.  [name] buPROPion (WELLBUTRIN SR) 150 MG 12 hr tablet Take 150 mg by mouth 2 (two) times daily. Reported on 12/06/2015  . furosemide (LASIX) 20 MG tablet Take 20 mg by mouth daily.  [name] glipiZIDE (GLUCOTROL) 5 MG tablet Take 5 mg by mouth daily before breakfast.   . HYDROcodone-acetaminophen (NORCO/VICODIN) 5-325 MG tablet Take 1 tablet by mouth every 6 (six) hours as needed for moderate pain.  [name] letrozole (FEMARA) 2.5 MG tablet Take 2.5 mg by mouth daily.  [name] levothyroxine (SYNTHROID, LEVOTHROID) 75 MCG tablet Take 75 mcg by mouth daily before breakfast.  . loperamide (IMODIUM) 2 MG capsule Take 1 capsule by mouth every 6 hours as needed for diarrhea.  [name] omeprazole (PRILOSEC) 20 MG capsule Take 20 mg by mouth daily.  . potassium chloride 20 MEQ TBCR Take 20 mEq by mouth 2 (two) times daily.  . pravastatin (PRAVACHOL) 40 MG tablet Take 40 mg by mouth daily.  . promethazine (PHENERGAN) 25 MG tablet Take 25 mg by mouth every 6 (six) hours.     FAMILY HISTORY:  Her indicated that her mother is deceased. She indicated that her brother is deceased.   SOCIAL HISTORY: She  reports that she has never smoked. She does not have any smokeless tobacco history on file. She  reports that she does not drink alcohol or use illicit drugs.  REVIEW OF SYSTEMS:   Unable to obtain  SUBJECTIVE:   VITAL SIGNS: BP 89/44 mmHg  Pulse 80  Temp(Src) 96.7 F (35.9 C) (Axillary)  Resp 16  Ht 5\' 8"  (1.727 m)  Wt 241 lb 2.9 oz (109.4 kg)  BMI 36.68 kg/m2  SpO2 98%  HEMODYNAMICS:    VENTILATOR SETTINGS:    INTAKE / OUTPUT: I/O last 3 completed shifts: In: 99.2 [I.V.:99.2] Out: 153 [Urine:151;  Stool:2]  PHYSICAL EXAMINATION:  General: NAD Neuro: Somnolent but awakens to voice and touch HEENT:  PERRLA, trachea midline Cardiovascular: RRR, s1/s2, no MRG Lungs:  Bilateral airflow, +rhonchi, no whexes Abdomen:  Obese, soft, normal bowel sounds; mild diffuse tenderness on palpation Musculoskeletal:  No deformities, +rom Extremities: +2 pulses, +1 edema bilaterally Skin:  Warm and dry  LABS:  BMET  Recent Labs Lab 01/05/16 1330  NA 140  K 4.7  CL 111  CO2 17*  BUN 64*  CREATININE 5.22*  GLUCOSE 199*    Electrolytes  Recent Labs Lab 01/05/16 1330  CALCIUM 7.9*    CBC  Recent Labs Lab 01/05/16 1330  WBC 25.0*  HGB 9.0*  HCT 28.0*  PLT 156    Coag's No results for input(s): APTT, INR in the last 168 hours.  Sepsis Markers  Recent Labs Lab 01/05/16 1350 01/05/16 1708  LATICACIDVEN 4.8* 3.4*    ABG No results for input(s): PHART, PCO2ART, PO2ART in the last 168 hours.  Liver Enzymes  Recent Labs Lab 01/05/16 1330  AST 33  ALT 18  ALKPHOS 156*  BILITOT 0.6  ALBUMIN 2.2*    Cardiac Enzymes  Recent Labs Lab 01/05/16 1330  TROPONINI 0.07*    Glucose  Recent Labs Lab 01/05/16 1342 01/05/16 1556 01/05/16 1900 01/05/16 2152 01/06/16 0015  GLUCAP 195* 174* 205* 239* 265*    Imaging Ct Abdomen Pelvis Wo Contrast  01/05/2016  CLINICAL DATA:  Altered mental status, hypoglycemia. Recent hospital admission for cholangitis. Stage IV pancreatic cancer. EXAM: CT ABDOMEN AND PELVIS WITHOUT CONTRAST TECHNIQUE: Multidetector CT imaging of the abdomen and pelvis was performed following the standard protocol without IV contrast. COMPARISON:  CT abdomen dated 11/19/2015. FINDINGS: Lower chest:  Stable mild atelectasis at each lung base. Hepatobiliary: Biliary stent in place, grossly well-positioned traversing the expected area of the ampulla. No significant bile duct dilatation appreciated. Gallbladder is mildly distended but otherwise  unremarkable. Expected pneumobilia. Liver partially obscured by beam hardening artifact from patient's overlying arms, limiting characterization, but no focal mass or lesion is identified in the liver. Pancreas: Pancreas appears stable.  No peripancreatic fluid. Spleen: Within normal limits in size. Adrenals/Urinary Tract: Adrenal glands appear stable. Stable masses exophytic to the lateral cortices of each kidney, difficult to definitively characterize without IV contrast, but grossly stable compared to multiple prior CTs dating back to 2012 suggesting benignity. No renal stone or hydronephrosis. No ureteral or bladder calculi identified. Bladder appears normal. Stomach/Bowel: Bowel is normal in caliber. No bowel wall thickening or evidence of bowel wall inflammation seen. Stomach is unremarkable. Appendix is normal. Vascular/Lymphatic: Scattered atherosclerotic changes of the normal- caliber abdominal aorta. No enlarged lymph nodes appreciated in the abdomen or pelvis. Reproductive: No mass or other significant abnormality. Other: No free fluid or abscess collection seen. No free intraperitoneal air. Musculoskeletal: Degenerative changes throughout the thoracolumbar spine but no acute osseous abnormality seen. No evidence of osseous metastasis seen. Superficial soft tissues are unremarkable. IMPRESSION: 1.  No acute findings. No free fluid or inflammatory change. No free intraperitoneal air. No bowel obstruction. 2. Appropriate positioning of the biliary stent. Associated pneumobilia. 3. Stable mild atelectasis at each lung base. Electronically Signed   By: Franki Cabot M.D.   On: 01/05/2016 17:01   Dg Chest 1 View  01/05/2016  CLINICAL DATA:  82 year old female with a history of weakness and possible sepsis EXAM: CHEST 1 VIEW COMPARISON:  12/18/2015 FINDINGS: Cardiomediastinal silhouette unchanged. Port catheter on the right chest wall via right IJ approach, unchanged. Low lung volumes. No confluent airspace  disease. No pleural effusion or pneumothorax. IMPRESSION: Negative for acute cardiopulmonary disease. Signed, Dulcy Fanny. Earleen Newport, DO [facility] Electronically Signed   By: Corrie Mckusick D.O.   On: 01/05/2016 14:52    STUDIES: None  CULTURES: 01/05/16 Blood x2 Urine   ANTIBIOTICS: 01/05/2016 Vancomycin Zosyn  SIGNIFICANT EVENTS: 05/20: Admitted with sepsis and septic shock  LINES/TUBES: Right chest wall port  DISCUSSION: 82 yo AAF with stage IV pancreatic cancer presenting with sepsis, septic shock necessitating pressors, Acute on chronic kidney disease and failure to thrive  ASSESSMENT / PLAN:  CARDIOVASCULAR A:  Septic shock H/o Hypertension H/O hyperlipidemia P:  -Hemodynamics per ICU protocol -IV fluids and low dose levophed; titrate to MAP goal of 65 -Will hold off on central venous catheter placement for now -OK to access mediport -Hold all antihypertensives -Cycle cardiac enzymes  RENAL A:   Acute on chronic renal failure-baseline creatinine 2-3; now 5.22 P:   -iv fluids -monitor I's and O's -Monitor BMP  GASTROINTESTINAL A:  Abdominal pain  Adult failure to thrive 2/2 poor oral intake, disease process and treatment side effects H/O GERD P:   -encourage oral intake as tolerated -Ensure 1 can qid -Continue home meds -US abdomen today  HEMATOLOGIC A:   Stage IV pancreatic cancer H/O uterine and breast cancer P:  -Oncology following -Will request palliative care consult  INFECTIOUS A:   Sepsis of unknown source P:   -Empiric antibiotics -F/U cultures  ENDOCRINE A:   Hypoglycemia likely due to poor oral intake Hypothyroidism P:   -Hold oral hypoglycemic agents -Monitor blood glucose q4h without SS coverage - Check TSH  NEUROLOGIC A:   Acute metabolic encephalopathy 2/2 sepsis and hypoglycemia P:   RASS goal:  -Monitor neuro status   FAMILY  - Updates: No family at  bedside at this time. Will update family as necessary   [facility] ANP-BC [facility] Pager [phone] or [phone]  PCCM ATTENDING ATTESTATION:  I have evaluated patient with ANP Patria Mane, reviewed database in its entirety and discussed care plan in detail. In addition, this patient was discussed on multidisciplinary rounds. Micro results reviewed and antibiotic therapy discussed with Dr Verdell Carmine. Concern for possible port infection. She might need removal of this. If so, as long as norepinephrine dose is low and requirements are decreasing, peripheral access will probably suffice. She seems to be improving. Nonetheless, given advanced cancer and age, DNR remains appropriate. Once off vasopressors, she should be ready for transfer to [facility] floor  Merton Border, MD PCCM service Mobile [phone] Pager [phone]

## 2016-01-07 ENCOUNTER — Inpatient Hospital Stay: Payer: Medicare Other

## 2016-01-07 ENCOUNTER — Ambulatory Visit: Payer: Medicare Other

## 2016-01-07 DIAGNOSIS — R630 Anorexia: Secondary | ICD-10-CM

## 2016-01-07 DIAGNOSIS — Z9221 Personal history of antineoplastic chemotherapy: Secondary | ICD-10-CM

## 2016-01-07 DIAGNOSIS — G9341 Metabolic encephalopathy: Secondary | ICD-10-CM

## 2016-01-07 DIAGNOSIS — R109 Unspecified abdominal pain: Secondary | ICD-10-CM | POA: Insufficient documentation

## 2016-01-07 DIAGNOSIS — I129 Hypertensive chronic kidney disease with stage 1 through stage 4 chronic kidney disease, or unspecified chronic kidney disease: Secondary | ICD-10-CM

## 2016-01-07 DIAGNOSIS — N184 Chronic kidney disease, stage 4 (severe): Secondary | ICD-10-CM

## 2016-01-07 DIAGNOSIS — M109 Gout, unspecified: Secondary | ICD-10-CM

## 2016-01-07 DIAGNOSIS — R627 Adult failure to thrive: Secondary | ICD-10-CM

## 2016-01-07 DIAGNOSIS — N189 Chronic kidney disease, unspecified: Secondary | ICD-10-CM

## 2016-01-07 DIAGNOSIS — M1990 Unspecified osteoarthritis, unspecified site: Secondary | ICD-10-CM

## 2016-01-07 DIAGNOSIS — I959 Hypotension, unspecified: Secondary | ICD-10-CM

## 2016-01-07 DIAGNOSIS — K219 Gastro-esophageal reflux disease without esophagitis: Secondary | ICD-10-CM

## 2016-01-07 DIAGNOSIS — D509 Iron deficiency anemia, unspecified: Secondary | ICD-10-CM

## 2016-01-07 DIAGNOSIS — C259 Malignant neoplasm of pancreas, unspecified: Secondary | ICD-10-CM

## 2016-01-07 DIAGNOSIS — C786 Secondary malignant neoplasm of retroperitoneum and peritoneum: Secondary | ICD-10-CM

## 2016-01-07 DIAGNOSIS — Z515 Encounter for palliative care: Secondary | ICD-10-CM

## 2016-01-07 DIAGNOSIS — E872 Acidosis, unspecified: Secondary | ICD-10-CM | POA: Insufficient documentation

## 2016-01-07 DIAGNOSIS — E079 Disorder of thyroid, unspecified: Secondary | ICD-10-CM

## 2016-01-07 DIAGNOSIS — Z8542 Personal history of malignant neoplasm of other parts of uterus: Secondary | ICD-10-CM

## 2016-01-07 DIAGNOSIS — E119 Type 2 diabetes mellitus without complications: Secondary | ICD-10-CM

## 2016-01-07 DIAGNOSIS — R7989 Other specified abnormal findings of blood chemistry: Secondary | ICD-10-CM

## 2016-01-07 DIAGNOSIS — E785 Hyperlipidemia, unspecified: Secondary | ICD-10-CM

## 2016-01-07 DIAGNOSIS — E860 Dehydration: Secondary | ICD-10-CM | POA: Insufficient documentation

## 2016-01-07 DIAGNOSIS — N179 Acute kidney failure, unspecified: Secondary | ICD-10-CM

## 2016-01-07 DIAGNOSIS — E43 Unspecified severe protein-calorie malnutrition: Secondary | ICD-10-CM

## 2016-01-07 DIAGNOSIS — I10 Essential (primary) hypertension: Secondary | ICD-10-CM

## 2016-01-07 DIAGNOSIS — Z853 Personal history of malignant neoplasm of breast: Secondary | ICD-10-CM

## 2016-01-07 LAB — BASIC METABOLIC PANEL
Anion gap: 9 (ref 5–15)
BUN: 61 mg/dL — AB (ref 6–20)
CALCIUM: 7.3 mg/dL — AB (ref 8.9–10.3)
CO2: 17 mmol/L — AB (ref 22–32)
CREATININE: 3.68 mg/dL — AB (ref 0.44–1.00)
Chloride: 114 mmol/L — ABNORMAL HIGH (ref 101–111)
GFR calc non Af Amer: 10 mL/min — ABNORMAL LOW (ref >60)
GFR, EST AFRICAN AMERICAN: 12 mL/min — AB (ref >60)
Glucose, Bld: 202 mg/dL — ABNORMAL HIGH (ref 65–99)
Potassium: 4.5 mmol/L (ref 3.5–5.1)
SODIUM: 140 mmol/L (ref 135–145)

## 2016-01-07 LAB — GLUCOSE, CAPILLARY
GLUCOSE-CAPILLARY: 163 mg/dL — AB (ref 65–99)
GLUCOSE-CAPILLARY: 197 mg/dL — AB (ref 65–99)
Glucose-Capillary: 200 mg/dL — ABNORMAL HIGH (ref 65–99)
Glucose-Capillary: 178 mg/dL — ABNORMAL HIGH (ref 65–99)

## 2016-01-07 LAB — CBC
HCT: 25.5 % — ABNORMAL LOW (ref 35.0–47.0)
Hemoglobin: 8.2 g/dL — ABNORMAL LOW (ref 12.0–16.0)
MCH: 30.2 pg (ref 26.0–34.0)
MCHC: 32.1 g/dL (ref 32.0–36.0)
MCV: 93.9 fL (ref 80.0–100.0)
PLATELETS: 114 10*3/uL — AB (ref 150–440)
RBC: 2.71 MIL/uL — AB (ref 3.80–5.20)
RDW: 18.1 % — AB (ref 11.5–14.5)
WBC: 17.2 10*3/uL — ABNORMAL HIGH (ref 3.6–11.0)

## 2016-01-07 MED ORDER — OXYCODONE HCL 5 MG PO TABS
10.0000 mg | ORAL_TABLET | ORAL | Status: DC | PRN
  Administered 2016-01-10: 10 mg via ORAL
  Filled 2016-01-07 (×2): qty 2

## 2016-01-07 MED ORDER — DOCUSATE SODIUM 100 MG PO CAPS
100.0000 mg | ORAL_CAPSULE | Freq: Two times a day (BID) | ORAL | Status: DC
  Administered 2016-01-07 – 2016-01-10 (×5): 100 mg via ORAL
  Filled 2016-01-07 (×8): qty 1

## 2016-01-07 MED ORDER — OXYCODONE HCL 5 MG PO TABS
5.0000 mg | ORAL_TABLET | ORAL | Status: DC | PRN
  Administered 2016-01-08 – 2016-01-09 (×4): 5 mg via ORAL
  Filled 2016-01-07 (×4): qty 1

## 2016-01-07 NOTE — Progress Notes (Signed)
[facility] NAME: Pamela Preston    MR#:  [id number]  DATE OF BIRTH:  May 02, 1930  SUBJECTIVE:   Pt is still a bit confused today. Remains on low-dose Levophed. Afebrile.  REVIEW OF SYSTEMS:    Review of Systems  Constitutional: Negative for fever and chills.  HENT: Negative for congestion and tinnitus.   Eyes: Negative for blurred vision and double vision.  Respiratory: Negative for cough, shortness of breath and wheezing.   Cardiovascular: Negative for chest pain, orthopnea and PND.  Gastrointestinal: Negative for nausea, vomiting, abdominal pain and diarrhea.  Genitourinary: Negative for dysuria and hematuria.  Neurological: Negative for dizziness, sensory change and focal weakness.  All other systems reviewed and are negative.   Nutrition: Regular Tolerating Diet: Yes Tolerating PT: Await Eval.   DRUG ALLERGIES:   Allergies  Allergen Reactions  . Colchicine Other (See Comments)    Unknown reaction  . Ketorolac Other (See Comments)    Unknown reaction  . Lactase Diarrhea  . Lipitor [Atorvastatin] Other (See Comments)    Unknown reaction    VITALS:  Blood pressure 134/48, pulse 98, temperature 97.7 F (36.5 C), temperature source Oral, resp. rate 22, height 5\' 8"  (1.727 m), weight 111.1 kg (244 lb 14.9 oz), SpO2 98 %.  PHYSICAL EXAMINATION:   Physical Exam  GENERAL:  82 y.o.-year-old obese patient lying in the bed in no acute distress.  EYES: Pupils equal, round, reactive to light and accommodation. No scleral icterus. Extraocular muscles intact.  HEENT: Head atraumatic, normocephalic. Oropharynx and nasopharynx clear.  NECK:  Supple, no jugular venous distention. No thyroid enlargement, no tenderness.  LUNGS: Normal breath sounds bilaterally, no wheezing, rales, rhonchi. No use of accessory muscles of respiration.  CARDIOVASCULAR: S1, S2 normal. No murmurs, rubs, or gallops.  ABDOMEN: Soft, slightly tender in  RUQ area, no rebound rigidity, nondistended. Bowel sounds present. No organomegaly or mass.  EXTREMITIES: No cyanosis, clubbing or edema b/l.    NEUROLOGIC: Cranial nerves II through XII are intact. No focal Motor or sensory deficits b/l.  Globally weak PSYCHIATRIC: The patient is alert and oriented x 2.  SKIN: No obvious rash, lesion, or ulcer.    LABORATORY PANEL:   CBC  Recent Labs Lab 01/07/16 0454  WBC 17.2*  HGB 8.2*  HCT 25.5*  PLT 114*   ------------------------------------------------------------------------------------------------------------------  Chemistries   Recent Labs Lab 01/06/16 0527 01/07/16 0454  NA 140 140  K 4.5 4.5  CL 113* 114*  CO2 18* 17*  GLUCOSE 245* 202*  BUN 58* 61*  CREATININE 4.01* 3.68*  CALCIUM 7.6* 7.3*  AST 29  --   ALT 22  --   ALKPHOS 154*  --   BILITOT 0.8  --    ------------------------------------------------------------------------------------------------------------------  Cardiac Enzymes  Recent Labs Lab 01/06/16 0527  TROPONINI 0.09*   ------------------------------------------------------------------------------------------------------------------  RADIOLOGY:  Ct Abdomen Pelvis Wo Contrast  01/05/2016  CLINICAL DATA:  Altered mental status, hypoglycemia. Recent hospital admission for cholangitis. Stage IV pancreatic cancer. EXAM: CT ABDOMEN AND PELVIS WITHOUT CONTRAST TECHNIQUE: Multidetector CT imaging of the abdomen and pelvis was performed following the standard protocol without IV contrast. COMPARISON:  CT abdomen dated 11/19/2015. FINDINGS: Lower chest:  Stable mild atelectasis at each lung base. Hepatobiliary: Biliary stent in place, grossly well-positioned traversing the expected area of the ampulla. No significant bile duct dilatation appreciated. Gallbladder is mildly distended but otherwise unremarkable. Expected pneumobilia. Liver partially obscured by beam hardening artifact from patient's  overlying arms,  limiting characterization, but no focal mass or lesion is identified in the liver. Pancreas: Pancreas appears stable.  No peripancreatic fluid. Spleen: Within normal limits in size. Adrenals/Urinary Tract: Adrenal glands appear stable. Stable masses exophytic to the lateral cortices of each kidney, difficult to definitively characterize without IV contrast, but grossly stable compared to multiple prior CTs dating back to 2012 suggesting benignity. No renal stone or hydronephrosis. No ureteral or bladder calculi identified. Bladder appears normal. Stomach/Bowel: Bowel is normal in caliber. No bowel wall thickening or evidence of bowel wall inflammation seen. Stomach is unremarkable. Appendix is normal. Vascular/Lymphatic: Scattered atherosclerotic changes of the normal- caliber abdominal aorta. No enlarged lymph nodes appreciated in the abdomen or pelvis. Reproductive: No mass or other significant abnormality. Other: No free fluid or abscess collection seen. No free intraperitoneal air. Musculoskeletal: Degenerative changes throughout the thoracolumbar spine but no acute osseous abnormality seen. No evidence of osseous metastasis seen. Superficial soft tissues are unremarkable. IMPRESSION: 1. No acute findings. No free fluid or inflammatory change. No free intraperitoneal air. No bowel obstruction. 2. Appropriate positioning of the biliary stent. Associated pneumobilia. 3. Stable mild atelectasis at each lung base. Electronically Signed   By: Franki Cabot M.D.   On: 01/05/2016 17:01     ASSESSMENT AND PLAN:   82 year old female with past medical history of pancreatic cancer, chronic kidney disease stage III, diabetes, history of breast, uterine cancer, hyperlipidemia, GERD, recent admission for Escherichia coli sepsis status post biliary stent placement who presents to the hospital due to altered mental status.  1. Sepsis-source unclear. Patient had a recent admission secondary to Escherichia coli bacteremia  and cholangitis. -Her LFTs are currently stable and her CT scan of abdomen pelvis does not show any acute abnormality. -Continue broad-spectrum IV antibiotics with Zosyn. Culture is presently undergoing Klebsiella, Streptococcus. - await further ID input.  ?? Port-a-cath infected but await ID input.  -Continue supportive care with IV fluids, IV vasopressors, IV antibiotics as mentioned.  2. Altered mental status-metabolic encephalopathy due to sepsis/hypoglycemia. - a bit confused today compared to yesterday and will monitor.  - cont. Supportive care to treat underlying sepsis, BS stable.   3. Abdominal/Hip pain - etiology unclear.  Non-specific.  - will get Abdominal and pelvic X-ray's and follow up.   4. Hypoglycemia-secondary to sepsis. Improved with dextrose administration.  - follow BS  5. Leukocytosis-secondary to sepsis. - improving w/ IV abx and will monitor.   6. Pancreatic cancer stage IV-status post recent biliary stent placement. LFTs currently stable. No acute abdominal pain. We'll follow clinically.  7. Acute on chronic renal failure-this is secondary to ATN and sepsis. Baseline creatinine is around 2.2-3 and patient presented with a creatinine of 5.2.  - improving w/ IV fluids and will monitor.   Transfer to floor once off Levophed.     All the records are reviewed and case discussed with Care Management/Social Workerr. Management plans discussed with the patient, family and they are in agreement.  CODE STATUS: DO NOT RESUSCITATE  DVT Prophylaxis: Ted's & SCD's.   TOTAL Critical Care TIME TAKING CARE OF THIS PATIENT: 30 minutes.   POSSIBLE D/C IN 2-3 DAYS, DEPENDING ON CLINICAL CONDITION.   Henreitta Leber M.D on 01/07/2016 at 3:50 PM  Between 7am to 6pm - Pager - [phone]  After 6pm go to [email or web address] - password [email or web address] [place] [facility]  Office  [phone]  CC: Primary care physician; Raylene Miyamoto., MD

## 2016-01-07 NOTE — Consult Note (Signed)
[facility] Infectious Disease     Reason for Consult: Bacteremia, sepsis   Referring Physician: Jeronimo Greaves Date of Admission:  01/05/2016   Active Problems:   Septic shock ([place])   Acute on chronic renal failure (HCC)   Lactic acidosis   Arterial hypotension   Poor appetite   Dehydration   HPI: Pamela Preston is a 82 y.o. female stage IV pancreatic cancer currently on chemo and radiation, ovarian cancer, CKD stage IV, hypertension and diabetes who presented to the ED with altered level of consciousness, fall and hypoglycemia with blood glucose in the 40s. She has since been found to have bacteremia with strep species and Klebsiella and ucx with proteus. She has a portacath in place. Also had a biliary stent with CT this admission showing no evidence obstruction or biliary infection. On admission wbc was 25, down now to 17.  Has been on vanco and zosyn.    Currently afebrile but has some pain on R side which is where she fell. No pain or redness at cath site.   Past Medical History  Diagnosis Date  . Hypertension   . GERD (gastroesophageal reflux disease)   . Diabetes mellitus without complication ([place])   . Breast cancer ([place]) 2013    c Lumpectomy 2013  . Uterine cancer ([place]) 1960  . Pancreatic cancer ([place])   . Abnormal LFTs   . Gout   . Thyroid disease   . Hyperlipidemia    Past Surgical History  Procedure Laterality Date  . Abdominal hysterectomy    . Knee arthroscopy    . Ercp N/A 09/04/2015    Procedure: ENDOSCOPIC RETROGRADE CHOLANGIOPANCREATOGRAPHY (ERCP);  Surgeon: Hulen Luster, MD;  Location[facility] ENDOSCOPY;  Service: Gastroenterology;  Laterality: N/A;  . Breast biopsy Right 2012    Positive  . Upper esophageal endoscopic ultrasound (eus) N/A 10/04/2015    Procedure: UPPER ESOPHAGEAL ENDOSCOPIC ULTRASOUND (EUS);  Surgeon: Holly Bodily, MD;  Location: [facility] ENDOSCOPY;  Service: Gastroenterology;  Laterality: N/A;  . Peripheral vascular catheterization N/A 11/05/2015     Procedure: Porta Cath Insertion;  Surgeon: Algernon Huxley, MD;  Location: [place] CV LAB;  Service: Cardiovascular;  Laterality: N/A;  . Ercp N/A 11/22/2015    Procedure: ENDOSCOPIC RETROGRADE CHOLANGIOPANCREATOGRAPHY (ERCP);  Surgeon: Hulen Luster, MD;  Location: [facility] ENDOSCOPY;  Service: Endoscopy;  Laterality: N/A;  For Thursday afternoon   Social History  Substance Use Topics  . Smoking status: Never Smoker   . Smokeless tobacco: None  . Alcohol Use: No   Family History  Problem Relation Age of Onset  . Cancer Mother   . Heart disease Brother     Allergies:  Allergies  Allergen Reactions  . Colchicine Other (See Comments)    Unknown reaction  . Ketorolac Other (See Comments)    Unknown reaction  . Lactase Diarrhea  . Lipitor [Atorvastatin] Other (See Comments)    Unknown reaction    Current antibiotics: Antibiotics Given (last 72 hours)    Date/Time Action Medication Dose Rate   01/05/16 2057 Given   vancomycin (VANCOCIN) IVPB 1000 mg/200 mL premix 1,000 mg 200 mL/hr   01/05/16 2137 Given   piperacillin-tazobactam (ZOSYN) IVPB 3.375 g 3.375 g 12.5 mL/hr   01/06/16 0935 Given   piperacillin-tazobactam (ZOSYN) IVPB 3.375 g 3.375 g 12.5 mL/hr   01/06/16 2140 Given   piperacillin-tazobactam (ZOSYN) IVPB 3.375 g 3.375 g 12.5 mL/hr   01/07/16 1037 Given   piperacillin-tazobactam (ZOSYN) IVPB 3.375 g  3.375 g 12.5 mL/hr      MEDICATIONS: . docusate sodium  100 mg Oral BID  . feeding supplement (ENSURE ENLIVE)  237 mL Oral TID AC & HS  . heparin  5,000 Units Subcutaneous Q8H  . insulin aspart  0-9 Units Subcutaneous TID AC & HS  . piperacillin-tazobactam (ZOSYN)  IV  3.375 g Intravenous Q12H  . sodium chloride flush  3 mL Intravenous Q12H    Review of Systems - 11 systems reviewed and negative per HPI   OBJECTIVE: Temp:  [97.7 F (36.5 C)-98.2 F (36.8 C)] 97.7 F (36.5 C) (05/22 0800) Pulse Rate:  [86-102] 88 (05/22 1100) Resp:  [13-36] 14 (05/22 1100) BP:  (89-127)/(39-65) 100/45 mmHg (05/22 1100) SpO2:  [97 %-100 %] 98 % (05/22 1100) Weight:  [111.1 kg (244 lb 14.9 oz)] 111.1 kg (244 lb 14.9 oz) (05/22 0440) Physical Exam  Constitutional:  oriented to person, place, and time. Chronically ill appearing HENT: [place]/AT, PERRLA, no scleral icterus Mouth/Throat: Oropharynx is clear and dry . No oropharyngeal exudate. Portacath R chest wall wnl  Cardiovascular: Normal rate, regular rhythm and normal heart sounds.  Pulmonary/Chest: Effort normal and breath sounds normal. No respiratory distress.  has no wheezes.  Neck supple, no nuchal rigidity Abdominal: Soft.  Mild ttp over R flank Lymphadenopathy: no cervical adenopathy. No axillary adenopathy Neurological: alert and oriented to person, place, and time.  Skin: Skin is warm and dry. No rash noted. No erythema.  Psychiatric: a normal mood and affect.  behavior is normal.    LABS: Results for orders placed or performed during the hospital encounter of 01/05/16 (from the past 48 hour(s))  Glucose, capillary     Status: Abnormal   Collection Time: 01/05/16  3:56 PM  Result Value Ref Range   Glucose-Capillary 174 (H) 65 - 99 mg/dL  Lactic acid, plasma     Status: Abnormal   Collection Time: 01/05/16  5:08 PM  Result Value Ref Range   Lactic Acid, Venous 3.4 (HH) 0.5 - 2.0 mmol/L    Comment: CRITICAL RESULT CALLED TO, READ BACK BY AND VERIFIED WITH MIA DILE AT 1807 ON 01/05/16 BY KBH   Urinalysis complete, with microscopic     Status: Abnormal   Collection Time: 01/05/16  5:48 PM  Result Value Ref Range   Color, Urine AMBER (A) YELLOW   APPearance TURBID (A) CLEAR   Glucose, UA NEGATIVE NEGATIVE mg/dL   Bilirubin Urine NEGATIVE NEGATIVE   Ketones, ur NEGATIVE NEGATIVE mg/dL   Specific Gravity, Urine 1.013 1.005 - 1.030   Hgb urine dipstick 2+ (A) NEGATIVE   pH 8.0 5.0 - 8.0   Protein, ur 100 (A) NEGATIVE mg/dL   Nitrite NEGATIVE NEGATIVE   Leukocytes, UA 3+ (A) NEGATIVE   RBC / HPF 6-30  0 - 5 RBC/hpf   WBC, UA TOO NUMEROUS TO COUNT 0 - 5 WBC/hpf   Bacteria, UA MANY (A) NONE SEEN   Squamous Epithelial / LPF NONE SEEN NONE SEEN   WBC Clumps PRESENT    Mucous PRESENT   Urine culture     Status: Abnormal (Preliminary result)   Collection Time: 01/05/16  5:48 PM  Result Value Ref Range   Specimen Description URINE, CATHETERIZED    Special Requests Normal    Culture >=100,000 COLONIES/mL PROTEUS MIRABILIS (A)    Report Status PENDING   MRSA PCR Screening     Status: None   Collection Time: 01/05/16  5:49 PM  Result Value Ref Range  MRSA by PCR NEGATIVE NEGATIVE    Comment:        The GeneXpert MRSA Assay (FDA approved for NASAL specimens only), is one component of a comprehensive MRSA colonization surveillance program. It is not intended to diagnose MRSA infection nor to guide or monitor treatment for MRSA infections.   Glucose, capillary     Status: Abnormal   Collection Time: 01/05/16  7:00 PM  Result Value Ref Range   Glucose-Capillary 205 (H) 65 - 99 mg/dL  Glucose, capillary     Status: Abnormal   Collection Time: 01/05/16  9:52 PM  Result Value Ref Range   Glucose-Capillary 239 (H) 65 - 99 mg/dL  Glucose, capillary     Status: Abnormal   Collection Time: 01/06/16 12:15 AM  Result Value Ref Range   Glucose-Capillary 265 (H) 65 - 99 mg/dL  Glucose, capillary     Status: Abnormal   Collection Time: 01/06/16  1:55 AM  Result Value Ref Range   Glucose-Capillary 248 (H) 65 - 99 mg/dL  Glucose, capillary     Status: Abnormal   Collection Time: 01/06/16  4:00 AM  Result Value Ref Range   Glucose-Capillary 214 (H) 65 - 99 mg/dL  Ammonia     Status: Abnormal   Collection Time: 01/06/16  5:27 AM  Result Value Ref Range   Ammonia <9 (L) 9 - 35 umol/L  CBC     Status: Abnormal   Collection Time: 01/06/16  5:27 AM  Result Value Ref Range   WBC 25.3 (H) 3.6 - 11.0 K/uL   RBC 3.18 (L) 3.80 - 5.20 MIL/uL   Hemoglobin 9.6 (L) 12.0 - 16.0 g/dL   HCT 30.1 (L)  35.0 - 47.0 %   MCV 94.8 80.0 - 100.0 fL   MCH 30.3 26.0 - 34.0 pg   MCHC 31.9 (L) 32.0 - 36.0 g/dL   RDW 18.9 (H) 11.5 - 14.5 %   Platelets 136 (L) 150 - 440 K/uL  Comprehensive metabolic panel     Status: Abnormal   Collection Time: 01/06/16  5:27 AM  Result Value Ref Range   Sodium 140 135 - 145 mmol/L   Potassium 4.5 3.5 - 5.1 mmol/L   Chloride 113 (H) 101 - 111 mmol/L   CO2 18 (L) 22 - 32 mmol/L   Glucose, Bld 245 (H) 65 - 99 mg/dL   BUN 58 (H) 6 - 20 mg/dL   Creatinine, Ser 4.01 (H) 0.44 - 1.00 mg/dL   Calcium 7.6 (L) 8.9 - 10.3 mg/dL   Total Protein 5.8 (L) 6.5 - 8.1 g/dL   Albumin 2.2 (L) 3.5 - 5.0 g/dL   AST 29 15 - 41 U/L   ALT 22 14 - 54 U/L   Alkaline Phosphatase 154 (H) 38 - 126 U/L   Total Bilirubin 0.8 0.3 - 1.2 mg/dL   GFR calc non Af Amer 9 (L) >60 mL/min   GFR calc Af Amer 11 (L) >60 mL/min    Comment: (NOTE) The eGFR has been calculated using the CKD EPI equation. This calculation has not been validated in all clinical situations. eGFR's persistently <60 mL/min signify possible Chronic Kidney Disease.    Anion gap 9 5 - 15  Protime-INR     Status: Abnormal   Collection Time: 01/06/16  5:27 AM  Result Value Ref Range   Prothrombin Time 15.8 (H) 11.4 - 15.0 seconds   INR 1.24   Troponin I     Status: Abnormal   Collection  Time: 01/06/16  5:27 AM  Result Value Ref Range   Troponin I 0.09 (H) <0.031 ng/mL    Comment: PREVIOUS RESULT CALLED AT 1406 01/05/16.[facility]        PERSISTENTLY INCREASED TROPONIN VALUES IN THE RANGE OF 0.04-0.49 ng/mL CAN BE SEEN IN:       -UNSTABLE ANGINA       -CONGESTIVE HEART FAILURE       -MYOCARDITIS       -CHEST TRAUMA       -ARRYHTHMIAS       -LATE PRESENTING MYOCARDIAL INFARCTION       -COPD   CLINICAL FOLLOW-UP RECOMMENDED.   TSH     Status: None   Collection Time: 01/06/16  5:27 AM  Result Value Ref Range   TSH 3.401 0.350 - 4.500 uIU/mL  Glucose, capillary     Status: Abnormal   Collection Time: 01/06/16  6:16 AM   Result Value Ref Range   Glucose-Capillary 216 (H) 65 - 99 mg/dL  Glucose, capillary     Status: Abnormal   Collection Time: 01/06/16  7:43 AM  Result Value Ref Range   Glucose-Capillary 187 (H) 65 - 99 mg/dL  Blood Culture ID Panel (Reflexed)     Status: Abnormal   Collection Time: 01/06/16  8:00 AM  Result Value Ref Range   Enterococcus species NOT DETECTED NOT DETECTED   Vancomycin resistance NOT DETECTED NOT DETECTED   Listeria monocytogenes NOT DETECTED NOT DETECTED   Staphylococcus species NOT DETECTED NOT DETECTED   Staphylococcus aureus NOT DETECTED NOT DETECTED   Methicillin resistance NOT DETECTED NOT DETECTED   Streptococcus species DETECTED (A) NOT DETECTED    Comment: CRITICAL RESULT CALLED TO, READ BACK BY AND VERIFIED WITH: SCOTT CHRISTY 01/06/16 0825 [place]    Streptococcus agalactiae NOT DETECTED NOT DETECTED   Streptococcus pneumoniae NOT DETECTED NOT DETECTED   Streptococcus pyogenes NOT DETECTED NOT DETECTED   Acinetobacter baumannii NOT DETECTED NOT DETECTED   Enterobacteriaceae species DETECTED (A) NOT DETECTED    Comment: CRITICAL RESULT CALLED TO, READ BACK BY AND VERIFIED WITH: SCOTT CHRISTY 01/06/16 0825 [place]    Enterobacter cloacae complex NOT DETECTED NOT DETECTED   Escherichia coli NOT DETECTED NOT DETECTED   Klebsiella oxytoca NOT DETECTED NOT DETECTED   Klebsiella pneumoniae DETECTED (A) NOT DETECTED    Comment: CRITICAL RESULT CALLED TO, READ BACK BY AND VERIFIED WITH: SCOTT CHRISTY 01/06/16 0825 [place]    Proteus species NOT DETECTED NOT DETECTED   Serratia marcescens NOT DETECTED NOT DETECTED   Carbapenem resistance NOT DETECTED NOT DETECTED   Haemophilus influenzae NOT DETECTED NOT DETECTED   Neisseria meningitidis NOT DETECTED NOT DETECTED   Pseudomonas aeruginosa NOT DETECTED NOT DETECTED   Candida albicans NOT DETECTED NOT DETECTED   Candida glabrata NOT DETECTED NOT DETECTED   Candida krusei NOT DETECTED NOT DETECTED   Candida parapsilosis NOT  DETECTED NOT DETECTED   Candida tropicalis NOT DETECTED NOT DETECTED  Glucose, capillary     Status: Abnormal   Collection Time: 01/06/16 11:52 AM  Result Value Ref Range   Glucose-Capillary 191 (H) 65 - 99 mg/dL  Glucose, capillary     Status: Abnormal   Collection Time: 01/06/16  5:44 PM  Result Value Ref Range   Glucose-Capillary 187 (H) 65 - 99 mg/dL  Glucose, capillary     Status: Abnormal   Collection Time: 01/06/16  9:06 PM  Result Value Ref Range   Glucose-Capillary 194 (H) 65 - 99 mg/dL  CBC  Status: Abnormal   Collection Time: 01/07/16  4:54 AM  Result Value Ref Range   WBC 17.2 (H) 3.6 - 11.0 K/uL   RBC 2.71 (L) 3.80 - 5.20 MIL/uL   Hemoglobin 8.2 (L) 12.0 - 16.0 g/dL   HCT 25.5 (L) 35.0 - 47.0 %   MCV 93.9 80.0 - 100.0 fL   MCH 30.2 26.0 - 34.0 pg   MCHC 32.1 32.0 - 36.0 g/dL   RDW 18.1 (H) 11.5 - 14.5 %   Platelets 114 (L) 150 - 440 K/uL  Basic metabolic panel     Status: Abnormal   Collection Time: 01/07/16  4:54 AM  Result Value Ref Range   Sodium 140 135 - 145 mmol/L   Potassium 4.5 3.5 - 5.1 mmol/L   Chloride 114 (H) 101 - 111 mmol/L   CO2 17 (L) 22 - 32 mmol/L   Glucose, Bld 202 (H) 65 - 99 mg/dL   BUN 61 (H) 6 - 20 mg/dL   Creatinine, Ser 3.68 (H) 0.44 - 1.00 mg/dL   Calcium 7.3 (L) 8.9 - 10.3 mg/dL   GFR calc non Af Amer 10 (L) >60 mL/min   GFR calc Af Amer 12 (L) >60 mL/min    Comment: (NOTE) The eGFR has been calculated using the CKD EPI equation. This calculation has not been validated in all clinical situations. eGFR's persistently <60 mL/min signify possible Chronic Kidney Disease.    Anion gap 9 5 - 15  Glucose, capillary     Status: Abnormal   Collection Time: 01/07/16  7:22 AM  Result Value Ref Range   Glucose-Capillary 163 (H) 65 - 99 mg/dL  Glucose, capillary     Status: Abnormal   Collection Time: 01/07/16 11:17 AM  Result Value Ref Range   Glucose-Capillary 197 (H) 65 - 99 mg/dL   No components found for: ESR, C REACTIVE  PROTEIN MICRO: Recent Results (from the past 720 hour(s))  Blood culture (routine x 2)     Status: None (Preliminary result)   Collection Time: 01/05/16  1:50 PM  Result Value Ref Range Status   Specimen Description BLOOD RIGHT ASSIST CONTROL  Final   Special Requests BOTTLES DRAWN AEROBIC AND ANAEROBIC 1 CC  Final   Culture  Setup Time   Final    GRAM POSITIVE COCCI IN BOTH AEROBIC AND ANAEROBIC BOTTLES CRITICAL VALUE NOTED.  VALUE IS CONSISTENT WITH PREVIOUSLY REPORTED AND CALLED VALUE. CONFIRMED BY [facility]    Culture   Final    GRAM POSITIVE COCCI IN BOTH AEROBIC AND ANAEROBIC BOTTLES IDENTIFICATION AND SUSCEPTIBILITIES TO FOLLOW    Report Status PENDING  Incomplete  Blood culture (routine x 2)     Status: Abnormal (Preliminary result)   Collection Time: 01/05/16  1:55 PM  Result Value Ref Range Status   Specimen Description BLOOD LEFT ASSIST CONTROL  Final   Special Requests BOTTLES DRAWN AEROBIC AND ANAEROBIC 7 CC  Final   Culture  Setup Time   Final    GRAM POSITIVE COCCI ANAEROBIC BOTTLE ONLY CRITICAL RESULT CALLED TO, READ BACK BY AND VERIFIED WITH: MATT MCBANE AT 0530 ON 01/06/16 RWW CONFIRMED BY [facility] GRAM NEGATIVE RODS AEROBIC BOTTLE ONLY CRITICAL RESULT CALLED TO, READ BACK BY AND VERIFIED WITH: SCOTT CHRISTY AT 0825 ON 01/06/16.Marland KitchenMarland KitchenRampart    Culture (A)  Final    STREPTOCOCCUS SPECIES ANAEROBIC BOTTLE ONLY KLEBSIELLA PNEUMONIAE AEROBIC BOTTLE ONLY SUSCEPTIBILITIES TO FOLLOW    Report Status PENDING  Incomplete  Blood Culture ID Panel (Reflexed)  Status: Abnormal   Collection Time: 01/05/16  1:55 PM  Result Value Ref Range Status   Enterococcus species NOT DETECTED NOT DETECTED Final   Vancomycin resistance NOT DETECTED NOT DETECTED Final   Listeria monocytogenes NOT DETECTED NOT DETECTED Final   Staphylococcus species NOT DETECTED NOT DETECTED Final   Staphylococcus aureus NOT DETECTED NOT DETECTED Final   Methicillin resistance NOT DETECTED NOT DETECTED Final    Streptococcus species DETECTED (A) NOT DETECTED Final    Comment: CRITICAL RESULT CALLED TO, READ BACK BY AND VERIFIED WITH: CALLED MATT MCBANE AT 0530 ON 01/06/16 RWW    Streptococcus agalactiae NOT DETECTED NOT DETECTED Final   Streptococcus pneumoniae NOT DETECTED NOT DETECTED Final   Streptococcus pyogenes NOT DETECTED NOT DETECTED Final   Acinetobacter baumannii NOT DETECTED NOT DETECTED Final   Enterobacteriaceae species NOT DETECTED NOT DETECTED Final   Enterobacter cloacae complex NOT DETECTED NOT DETECTED Final   Escherichia coli NOT DETECTED NOT DETECTED Final   Klebsiella oxytoca NOT DETECTED NOT DETECTED Final   Klebsiella pneumoniae NOT DETECTED NOT DETECTED Final   Proteus species NOT DETECTED NOT DETECTED Final   Serratia marcescens NOT DETECTED NOT DETECTED Final   Carbapenem resistance NOT DETECTED NOT DETECTED Final   Haemophilus influenzae NOT DETECTED NOT DETECTED Final   Neisseria meningitidis NOT DETECTED NOT DETECTED Final   Pseudomonas aeruginosa NOT DETECTED NOT DETECTED Final   Candida albicans NOT DETECTED NOT DETECTED Final   Candida glabrata NOT DETECTED NOT DETECTED Final   Candida krusei NOT DETECTED NOT DETECTED Final   Candida parapsilosis NOT DETECTED NOT DETECTED Final   Candida tropicalis NOT DETECTED NOT DETECTED Final  Urine culture     Status: Abnormal (Preliminary result)   Collection Time: 01/05/16  5:48 PM  Result Value Ref Range Status   Specimen Description URINE, CATHETERIZED  Final   Special Requests Normal  Final   Culture >=100,000 COLONIES/mL PROTEUS MIRABILIS (A)  Final   Report Status PENDING  Incomplete  MRSA PCR Screening     Status: None   Collection Time: 01/05/16  5:49 PM  Result Value Ref Range Status   MRSA by PCR NEGATIVE NEGATIVE Final    Comment:        The GeneXpert MRSA Assay (FDA approved for NASAL specimens only), is one component of a comprehensive MRSA colonization surveillance program. It is not intended to  diagnose MRSA infection nor to guide or monitor treatment for MRSA infections.   Blood Culture ID Panel (Reflexed)     Status: Abnormal   Collection Time: 01/06/16  8:00 AM  Result Value Ref Range Status   Enterococcus species NOT DETECTED NOT DETECTED Final   Vancomycin resistance NOT DETECTED NOT DETECTED Final   Listeria monocytogenes NOT DETECTED NOT DETECTED Final   Staphylococcus species NOT DETECTED NOT DETECTED Final   Staphylococcus aureus NOT DETECTED NOT DETECTED Final   Methicillin resistance NOT DETECTED NOT DETECTED Final   Streptococcus species DETECTED (A) NOT DETECTED Final    Comment: CRITICAL RESULT CALLED TO, READ BACK BY AND VERIFIED WITH: SCOTT CHRISTY 01/06/16 0825 [place]    Streptococcus agalactiae NOT DETECTED NOT DETECTED Final   Streptococcus pneumoniae NOT DETECTED NOT DETECTED Final   Streptococcus pyogenes NOT DETECTED NOT DETECTED Final   Acinetobacter baumannii NOT DETECTED NOT DETECTED Final   Enterobacteriaceae species DETECTED (A) NOT DETECTED Final    Comment: CRITICAL RESULT CALLED TO, READ BACK BY AND VERIFIED WITH: SCOTT CHRISTY 01/06/16 0825 [place]    Enterobacter  cloacae complex NOT DETECTED NOT DETECTED Final   Escherichia coli NOT DETECTED NOT DETECTED Final   Klebsiella oxytoca NOT DETECTED NOT DETECTED Final   Klebsiella pneumoniae DETECTED (A) NOT DETECTED Final    Comment: CRITICAL RESULT CALLED TO, READ BACK BY AND VERIFIED WITH: SCOTT CHRISTY 01/06/16 0825 [place]    Proteus species NOT DETECTED NOT DETECTED Final   Serratia marcescens NOT DETECTED NOT DETECTED Final   Carbapenem resistance NOT DETECTED NOT DETECTED Final   Haemophilus influenzae NOT DETECTED NOT DETECTED Final   Neisseria meningitidis NOT DETECTED NOT DETECTED Final   Pseudomonas aeruginosa NOT DETECTED NOT DETECTED Final   Candida albicans NOT DETECTED NOT DETECTED Final   Candida glabrata NOT DETECTED NOT DETECTED Final   Candida krusei NOT DETECTED NOT DETECTED Final    Candida parapsilosis NOT DETECTED NOT DETECTED Final   Candida tropicalis NOT DETECTED NOT DETECTED Final    IMAGING: Ct Abdomen Pelvis Wo Contrast  01/05/2016  CLINICAL DATA:  Altered mental status, hypoglycemia. Recent hospital admission for cholangitis. Stage IV pancreatic cancer. EXAM: CT ABDOMEN AND PELVIS WITHOUT CONTRAST TECHNIQUE: Multidetector CT imaging of the abdomen and pelvis was performed following the standard protocol without IV contrast. COMPARISON:  CT abdomen dated 11/19/2015. FINDINGS: Lower chest:  Stable mild atelectasis at each lung base. Hepatobiliary: Biliary stent in place, grossly well-positioned traversing the expected area of the ampulla. No significant bile duct dilatation appreciated. Gallbladder is mildly distended but otherwise unremarkable. Expected pneumobilia. Liver partially obscured by beam hardening artifact from patient's overlying arms, limiting characterization, but no focal mass or lesion is identified in the liver. Pancreas: Pancreas appears stable.  No peripancreatic fluid. Spleen: Within normal limits in size. Adrenals/Urinary Tract: Adrenal glands appear stable. Stable masses exophytic to the lateral cortices of each kidney, difficult to definitively characterize without IV contrast, but grossly stable compared to multiple prior CTs dating back to 2012 suggesting benignity. No renal stone or hydronephrosis. No ureteral or bladder calculi identified. Bladder appears normal. Stomach/Bowel: Bowel is normal in caliber. No bowel wall thickening or evidence of bowel wall inflammation seen. Stomach is unremarkable. Appendix is normal. Vascular/Lymphatic: Scattered atherosclerotic changes of the normal- caliber abdominal aorta. No enlarged lymph nodes appreciated in the abdomen or pelvis. Reproductive: No mass or other significant abnormality. Other: No free fluid or abscess collection seen. No free intraperitoneal air. Musculoskeletal: Degenerative changes throughout the  thoracolumbar spine but no acute osseous abnormality seen. No evidence of osseous metastasis seen. Superficial soft tissues are unremarkable. IMPRESSION: 1. No acute findings. No free fluid or inflammatory change. No free intraperitoneal air. No bowel obstruction. 2. Appropriate positioning of the biliary stent. Associated pneumobilia. 3. Stable mild atelectasis at each lung base. Electronically Signed   By: Franki Cabot M.D.   On: 01/05/2016 17:01   Dg Chest 1 View  01/05/2016  CLINICAL DATA:  82 year old female with a history of weakness and possible sepsis EXAM: CHEST 1 VIEW COMPARISON:  12/18/2015 FINDINGS: Cardiomediastinal silhouette unchanged. Port catheter on the right chest wall via right IJ approach, unchanged. Low lung volumes. No confluent airspace disease. No pleural effusion or pneumothorax. IMPRESSION: Negative for acute cardiopulmonary disease. Signed, Dulcy Fanny. Earleen Newport, DO [facility] Electronically Signed   By: Corrie Mckusick D.O.   On: 01/05/2016 14:52   Dg Chest 2 View  12/18/2015  CLINICAL DATA:  Syncope.  History of breast and uterine carcinoma EXAM: CHEST  2 VIEW COMPARISON:  November 19, 2015. FINDINGS: Port-A-Cath tip  is in the superior vena cava. No pneumothorax. There is no edema or consolidation. Heart size and pulmonary vascularity are normal. No adenopathy. No bone lesions. IMPRESSION: No edema or consolidation. Port-A-Cath tip in superior vena cava. No pneumothorax. Electronically Signed   By: Lowella Grip III M.D.   On: 12/18/2015 13:41    Assessment:   KERIA WIDRIG is a 82 y.o. female stage IV pancreatic cancer currently on chemo and radiation, ovarian cancer, CKD stage IV, hypertension and diabetes who presented to the ED with altered level of consciousness, fall and hypoglycemia with blood glucose in the 40s. She has since been found to have bacteremia with strep species and Klebsiella and ucx with proteus. She  has a portacath in place. Also had a biliary stent with CT this admission showing no evidence obstruction or biliary infection. On admission wbc was 25, down now to 17.  Has been on vanco and zosyn.     Unclear if bacteremia is from the portacath or from GI/biliary source.  Clinically improving. Hesitant to remove port without definitive need.   Recommendations Continue current abx Repeat bcx x 2 (one from port and one from peripheral  Thank you very much for allowing me to participate in the care of this patient. Please call with questions.   Cheral Marker. Ola Spurr, MD

## 2016-01-07 NOTE — Progress Notes (Signed)
Pt. VSS O/N. Weaned levo gtt to 2 mcg.  Dr. Marcille Blanco OK'd a modification in levo order to titrate to a MAP goal of 65 or a SBP goal of 90. Pt. Pain controlled with one norco dose, pt. Was able to rest most of the evening. AM labs showed worsening BUN and Cr. Levels. Pt. Output on average 20 mL/h. WBC decreased, BC positive for multiple sources- pt. On zosyn only at this time.  Report to be given to amelia, RN.

## 2016-01-07 NOTE — Consult Note (Signed)
PULMONARY / CRITICAL CARE MEDICINE   Name: Pamela Preston MRN: [id number] DOB: 28-Nov-1929    ADMISSION DATE:  01/05/2016   CONSULTATION DATE:  01/06/2016  REFERRING MD:  Dr. Verdell Carmine  CHIEF COMPLAINT:  Hypoglycemia and altered mental status  HISTORY OF PRESENT ILLNESS:   82 yo AA female with a PMH of stage IV pancreatic cancer currently on chemo and radiation, ovarian cancer, CKD stage IV, hypertension and diabetes who presented to the ED with altered level of consciousness, fall and hypoglycemia with blood glucose in the 40s. History is obtained from patient's chart as patient is still lethargic. She was treated for hypoglycemia in the field by EMS. EMS also obtained an EKG that suggested a STEMI however, the repeat EKG was unremarkable. At the ED, she continued to be hypotensive. She was given fluid boluses and started on a low dose of levophed. She admitted to the ICU for sepsis of unknown source and septic shock.  PCCM was consulted for further management.    SUBJECTIVE: Still with dec PO intake. Now with low UOP   PAST MEDICAL HISTORY :  She  has a past medical history of Hypertension; GERD (gastroesophageal reflux disease); Diabetes mellitus without complication ([place]); Breast cancer ([place]) (2013); Uterine cancer ([place]) (1960); Pancreatic cancer ([place]); Abnormal LFTs; Gout; Thyroid disease; and Hyperlipidemia.  PAST SURGICAL HISTORY: She  has past surgical history that includes Abdominal hysterectomy; Knee arthroscopy; ERCP (N/A, 09/04/2015); Breast biopsy (Right, 2012); Upper esophageal endoscopic ultrasound (eus) (N/A, 10/04/2015); Cardiac catheterization (N/A, 11/05/2015); and ERCP (N/A, 11/22/2015).  Allergies  Allergen Reactions  . Colchicine Other (See Comments)    Unknown reaction  . Ketorolac Other (See Comments)    Unknown reaction  . Lactase Diarrhea  . Lipitor [Atorvastatin] Other (See Comments)    Unknown reaction    Current Facility-Administered Medications on File Prior  to Encounter  Medication  . 0.9 %  sodium chloride infusion  . heparin lock flush 100 unit/mL  . heparin lock flush 100 unit/mL  . sodium chloride flush (NS) 0.9 % injection 10 mL  . sodium chloride flush (NS) 0.9 % injection 10 mL   Current Outpatient Prescriptions on File Prior to Encounter  Medication Sig  . amLODipine (NORVASC) 10 MG tablet Take 10 mg by mouth daily.  [name] buPROPion (WELLBUTRIN SR) 150 MG 12 hr tablet Take 150 mg by mouth 2 (two) times daily. Reported on 12/06/2015  . furosemide (LASIX) 20 MG tablet Take 20 mg by mouth daily.  [name] glipiZIDE (GLUCOTROL) 5 MG tablet Take 5 mg by mouth daily before breakfast.   . HYDROcodone-acetaminophen (NORCO/VICODIN) 5-325 MG tablet Take 1 tablet by mouth every 6 (six) hours as needed for moderate pain.  [name] letrozole (FEMARA) 2.5 MG tablet Take 2.5 mg by mouth daily.  [name] levothyroxine (SYNTHROID, LEVOTHROID) 75 MCG tablet Take 75 mcg by mouth daily before breakfast.  . loperamide (IMODIUM) 2 MG capsule Take 1 capsule by mouth every 6 hours as needed for diarrhea.  [name] omeprazole (PRILOSEC) 20 MG capsule Take 20 mg by mouth daily.  . potassium chloride 20 MEQ TBCR Take 20 mEq by mouth 2 (two) times daily.  . pravastatin (PRAVACHOL) 40 MG tablet Take 40 mg by mouth daily.  . promethazine (PHENERGAN) 25 MG tablet Take 25 mg by mouth every 6 (six) hours.     FAMILY HISTORY:  Her indicated that her mother is deceased. She indicated that her brother is deceased.   SOCIAL HISTORY: She  reports that she  has never smoked. She does not have any smokeless tobacco history on file. She reports that she does not drink alcohol or use illicit drugs.  REVIEW OF SYSTEMS:   Unable to obtain  VITAL SIGNS: BP 89/39 mmHg  Pulse 89  Temp(Src) 98 F (36.7 C) (Oral)  Resp 13  Ht 5\' 8"  (1.727 m)  Wt 244 lb 14.9 oz (111.1 kg)  BMI 37.25 kg/m2  SpO2 99%  HEMODYNAMICS:    VENTILATOR SETTINGS:    INTAKE / OUTPUT: I/O last 3 completed shifts: In:  2671.7 [I.V.:2521.7; IV Piggyback:150] Out: 926 [Urine:926]  PHYSICAL EXAMINATION:  General: NAD Neuro: Somnolent but awakens to voice and touch HEENT:  PERRLA, trachea midline Cardiovascular: RRR, s1/s2, no MRG Lungs:  Bilateral airflow, +rhonchi, no whexes Abdomen:  Obese, soft, normal bowel sounds; mild diffuse tenderness on palpation Musculoskeletal:  No deformities, +rom Extremities: +2 pulses, +1 edema bilaterally Skin:  Warm and dry  LABS:  BMET  Recent Labs Lab 01/05/16 1330 01/06/16 0527 01/07/16 0454  NA 140 140 140  K 4.7 4.5 4.5  CL 111 113* 114*  CO2 17* 18* 17*  BUN 64* 58* 61*  CREATININE 5.22* 4.01* 3.68*  GLUCOSE 199* 245* 202*    Electrolytes  Recent Labs Lab 01/05/16 1330 01/06/16 0527 01/07/16 0454  CALCIUM 7.9* 7.6* 7.3*    CBC  Recent Labs Lab 01/05/16 1330 01/06/16 0527 01/07/16 0454  WBC 25.0* 25.3* 17.2*  HGB 9.0* 9.6* 8.2*  HCT 28.0* 30.1* 25.5*  PLT 156 136* 114*    Coag's  Recent Labs Lab 01/06/16 0527  INR 1.24    Sepsis Markers  Recent Labs Lab 01/05/16 1350 01/05/16 1708  LATICACIDVEN 4.8* 3.4*    ABG No results for input(s): PHART, PCO2ART, PO2ART in the last 168 hours.  Liver Enzymes  Recent Labs Lab 01/05/16 1330 01/06/16 0527  AST 33 29  ALT 18 22  ALKPHOS 156* 154*  BILITOT 0.6 0.8  ALBUMIN 2.2* 2.2*    Cardiac Enzymes  Recent Labs Lab 01/05/16 1330 01/06/16 0527  TROPONINI 0.07* 0.09*    Glucose  Recent Labs Lab 01/06/16 0616 01/06/16 0743 01/06/16 1152 01/06/16 1744 01/06/16 2106 01/07/16 0722  GLUCAP 216* 187* 191* 187* 194* 163*    Imaging No results found.  STUDIES: None  CULTURES: 01/05/16 Blood x2 Urine   ANTIBIOTICS: 01/05/2016 Vancomycin Zosyn  SIGNIFICANT EVENTS: 05/20: Admitted with sepsis and septic shock  LINES/TUBES: Right chest wall port  DISCUSSION: 82 yo AAF with stage IV pancreatic cancer presenting with sepsis, septic shock  necessitating pressors, Acute on chronic kidney disease and failure to thrive  ASSESSMENT / PLAN:  CARDIOVASCULAR A:  Septic shock H/o Hypertension H/O hyperlipidemia P:  -Hemodynamics per ICU protocol -IV fluids and low dose levophed; titrate to MAP goal of 65 -Will hold off on central venous catheter placement for now ( as long as norepinephrine dose is low and requirements are decreasing, peripheral access will probably suffice) -OK to access mediport -Hold all antihypertensives -Cycle cardiac enzymes  RENAL A:   Acute on chronic renal failure-baseline creatinine 2-3; now 5.22 P:   -iv fluids -monitor I's and O's -Monitor BMP  GASTROINTESTINAL A:  Abdominal pain  Adult failure to thrive 2/2 poor oral intake, disease process and treatment side effects H/O GERD P:   -encourage oral intake as tolerated -Ensure 1 can qid -Continue home meds -US abdomen today  HEMATOLOGIC A:   Carcinoma of head of pancreas positive for adenocarcinoma, EUS staging is  T2 N0 M0 H/O uterine and breast cancer P:  -Oncology following -Will request palliative care consult  INFECTIOUS A:   Sepsis of unknown source - possbile port-a-cath P:   -Empiric antibiotics -F/U cultures -f/u ID recs  ENDOCRINE A:   Hypoglycemia likely due to poor oral intake Hypothyroidism P:   -Hold oral hypoglycemic agents -Monitor blood glucose q4h without SS coverage - Check TSH  NEUROLOGIC A:   Acute metabolic encephalopathy 2/2 sepsis and hypoglycemia P:   RASS goal:  -Monitor neuro status  I have personally obtained a history, examined the patient, evaluated laboratory and imaging results, formulated the assessment and plan and placed orders. CRITICAL CARE: The patient is critically ill with multiple organ systems failure and requires high complexity decision making for assessment and support, frequent evaluation and titration of therapies, application of advanced monitoring technologies and  extensive interpretation of multiple databases. Critical Care Time devoted to patient care services described in this note is 35 minutes.    Vilinda Boehringer, MD [facility] Pager [phone] (please enter 7-digits) On Call Pager - [phone] (please enter 7-digits)

## 2016-01-07 NOTE — Progress Notes (Signed)
Chaplain rounded the unit and provided a compassionate presence and spiritual support for the patient. Patient was able to drink some water. Pamela Preston [phone]

## 2016-01-07 NOTE — Progress Notes (Signed)
Pt has had intermittent periods of confusion on my shift. Pt pain controlled with Norco. Pt is resting comfortably on my shift. Pt in stable condition at this time. Education not able to complete due to intermittent confusion, family not present most of shift.  Report given to [place], [place]

## 2016-01-07 NOTE — Consult Note (Addendum)
Palliative Medicine Inpatient Consult Note   Name: Pamela Preston Date: 01/07/2016 MRN: [id number]  DOB: [birth year]  Referring Physician: Henreitta Leber, MD  Palliative Care consult requested for this 82 y.o. female for goals of medical therapy in patient with stage !V pancreatic cancer and sepsis.   -----------------------------------------------------------------------------------------  DISCUSSIONS AND PLAN: The patient's condition is terminal and her sepsis is going to keep recurring since her pancreatic mass was encroaching the mesenteric vein and it likely is still seeding her bloodstream.  Doubt port as source but either way, she is likely to have a grim prognosis with recurrent infections going forward. Review of notes suggests she has completed radiation and she may have finished the last planned chemo round.  She may not have any further options for cancer treatment. She has little likelihood of survival beyond a few months given her comorbid conditions and infections.  I saw pt briefly this morning when she was a bit confused. At that time, I clarified that the MOST form was to be VOIDED as the DNR directive (that Dr. Darvin Neighbours had obtained from the sister at admission) was a valid directive (it came from the decision maker at the time and it was well documented in the H and P and it post-dated the MOST form).  So pt was DNR.   Later, the patient's sister came up here and related that she was thinking of changing pt to a 'Limited Code' based on something someone had told her.  I came up again expecting to meet with the sister as pt had been confused. But, sister had gone home and pt was found in a completely alert and oriented state.  Additionally, her boyfriend of 20 years, Pamela Preston, was in the room and the two of them agreed I should talk to her with him present.  It was no longer a matter of clarifying wishes with the sister, since pt herself was alert and oriented.   I  discussed code status in very plain terms, and it seemed like the patient and her boyfriend were nodding and agreeing with me that going through resuscitation might not be such a good thing. But, at the end, the patient told me she wanted [place] to 'try everything'.  And by that, she means try a feeding tube, try dialysis, and try resuscitation.    The aggressive wishes for Full Code and full scope of treatment fit better with pts original MOST form. So, even though that MOST form has been (properly) voided, we are back to wishes that essentially state that she wants [place] to do the MOST POSSIBLE for her.   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 She is still here in 2017 and it may be that she feels she can beat this cancer also.  I don't think that will happen.    I have been asked to help with symptom management and therefore will adjust some of her pain RX orders.   -------------------------------------------------------------------------------------------  CLINICAL NARRATIVE: Pamela Preston is an 82 yo woman with stage IV pancreatic cancer currently getting chemo and radiation who is found to be septic.  She was sent from the nursing facility after being found to have altered mentation and a blood sugar of 47.  After D50 was given en route, her glucose was 195 in the ED.  She was admitted on 5/20 due to findings of hypotension, tachycardia, and a WBC of 25k with ARF noted also.  Diagnosis is sepsis and she now has had strep species and klebsiella growing in aerobic blood cx from 5/20 as well as a blood cx from 5/21 growing these organisms as well as Enterobacteriaceae. Additionally, a urine culture is growing >100K Proteus Mirabilis.  She has been seen by Infectious Disease consultant who is hesitant to ask for her port-a-cath to be removed given lack of evidence that this is the source. She has been on Zosyn and Vanco and has had some improvement.  A Foley was placed to monitor  urine output as she has acute renal failure with CKD stage !V at baseline.  Hypoglycemia has improved with D5NS.    Dr Darvin Neighbours spoke to pt's sister at the time of admission and he conveyed the seriousness of pts condition and sister agreed with DNR and DNI at this time.  There had been a MOST form asking for FULL CODE --this form has been completed earlier in May and is felt to be in need of being VOIDED due to the request of pts sister at time of admission for pt to be DNR/ DNI.   -----------------------------------------------------------------------------------------------   ACTIVE PROBLEMS: Sepsis and septic shock ---with hypoglycemia and leukocytosis ---she has been and remains on levophed (low dose now) Metabolic Encephalopathy ---alert and oriented most of the time  ---but has moments of confusion ---Dr. Metro Kung note from 5/11 states pt is having delusions at night --she is having dreams that she thinks are real.   Pancreatic Cancer, Stage IV ---dilated bile duct found Jan 2017 ---was staged Tii NO MO during EUS  And CT showed a pancreatic mass.   ---followed normally by Dr Oliva Bustard (who is retiring very soon). ---s/p placement of biliary stent ---last round of chemo was to start 5/11 and be given for 5-7 days (5-FU) ---then she was to have a Ct w/o CM and a PET scan 3 mos from May 17th ---radiation finished May 17th ---has abdominal pain and hip pain ---mass was 23 mm by 17 mm and was invading superior MESENTERIC VEIN CKD stage IV ---baseline Cr is 2.0-2.3 and pt presented with Cr of 5.2 ---due to ATN and sepsis related prerenal cause ---last seen in nephrology office at [facility] over a year ago ---has an appt with [facility] June 1st.  HTN DM2 Elevated LFTs  GERD Hyperlipidemia BREAST Cancer(2011-2013) -- lumpectomy --she was a Hospice Patient with [facility] from Aug 01, 2010 until Sep 17, 2009 UTERINE Cancer (1960) ( she did NOT have 'ovarian'  cancer) Gout Adult Failure to Thrive and Anorexia of Cancer ---with SEVERE MALNUTRITION ---with poor oral intake Thyroid Dz DJD ---has h/o bilat knee replacements Recent e-coli bacteremia due to Cholangitis  ---treated with merem and then Rocephin ---with normalization of bilirubin and LFTs and Lactate Iron Deficiency Anemia Allergic Rhinitis H/O achiles tendonitis -----------------------------------------------------------------------  REVIEW OF SYSTEMS:  Patient is not able to provide ROS reliably at this time due to some mild confusion.   SPIRITUAL SUPPORT SYSTEM: Yes.  SOCIAL HISTORY:  reports that she has never smoked. She does not have any smokeless tobacco history on file. She reports that she does not drink alcohol or use illicit drugs. Sister reports she is HCPOA.  I do not have a copy of this document as yet.  Pt was Discharged to [facility] on 4/9 after stay here for sepsis from cholangitis.  Previously lived alone. Sister is Rolly Salter. I see a reference to [facility] services being offered to pt  on 11/21/15 but do not know if she was followed by [facility].   LEGAL DOCUMENTS:  MOST form is voided TODAY DNR form is completed based on the order and documentation in H and P.  And finally --she is back to being FULL CODE per her wishes stated to me while she was completely alert and oriented.  So DNR form is destoyed. We need to procure the HCPOA document listing sister as decision maker when pt cannot make decisions.   CODE STATUS:  This has gone back and forth. After my long, detailed, in depth, and very graphic discussion about what resuscitation might involve and cause to happen or not happen --pt has clearly chosen to be FULL CODE again.  She is completely oriented at this time (5pm).   PAST MEDICAL HISTORY: Past Medical History  Diagnosis Date  . Hypertension   . GERD (gastroesophageal reflux disease)   . Diabetes mellitus without complication ([place])   .  Breast cancer ([place]) 2013    c Lumpectomy 2013  . Uterine cancer ([place]) 1960  . Pancreatic cancer ([place])   . Abnormal LFTs   . Gout   . Thyroid disease   . Hyperlipidemia     PAST SURGICAL HISTORY:  Past Surgical History  Procedure Laterality Date  . Abdominal hysterectomy    . Knee arthroscopy    . Ercp N/A 09/04/2015    Procedure: ENDOSCOPIC RETROGRADE CHOLANGIOPANCREATOGRAPHY (ERCP);  Surgeon: Hulen Luster, MD;  Location[facility] ENDOSCOPY;  Service: Gastroenterology;  Laterality: N/A;  . Breast biopsy Right 2012    Positive  . Upper esophageal endoscopic ultrasound (eus) N/A 10/04/2015    Procedure: UPPER ESOPHAGEAL ENDOSCOPIC ULTRASOUND (EUS);  Surgeon: Holly Bodily, MD;  Location[facility] ENDOSCOPY;  Service: Gastroenterology;  Laterality: N/A;  . Peripheral vascular catheterization N/A 11/05/2015    Procedure: Porta Cath Insertion;  Surgeon: Algernon Huxley, MD;  Location: [facility] CV LAB;  Service: Cardiovascular;  Laterality: N/A;  . Ercp N/A 11/22/2015    Procedure: ENDOSCOPIC RETROGRADE CHOLANGIOPANCREATOGRAPHY (ERCP);  Surgeon: Hulen Luster, MD;  Location[facility] ENDOSCOPY;  Service: Endoscopy;  Laterality: N/A;  For Thursday afternoon    ALLERGIES:  is allergic to colchicine; ketorolac; lactase; and lipitor.  MEDICATIONS:  Current Facility-Administered Medications  Medication Dose Route Frequency Provider Last Rate Last Dose  . 0.9 %  sodium chloride infusion   Intravenous Continuous Hillary Bow, MD 50 mL/hr at 01/07/16 1500    . acetaminophen (TYLENOL) tablet 650 mg  650 mg Oral Q6H PRN Hillary Bow, MD       Or  . acetaminophen (TYLENOL) suppository 650 mg  650 mg Rectal Q6H PRN Srikar Sudini, MD      . albuterol (PROVENTIL) (2.5 MG/3ML) 0.083% nebulizer solution 2.5 mg  2.5 mg Nebulization Q2H PRN Srikar Sudini, MD      . docusate sodium (COLACE) capsule 100 mg  100 mg Oral BID Vishal Mungal, MD   100 mg at 01/07/16 1454  . feeding supplement (ENSURE ENLIVE) (ENSURE ENLIVE)  liquid 237 mL  237 mL Oral TID AC & HS Mikael Spray, NP   237 mL at 01/07/16 1230  . heparin injection 5,000 Units  5,000 Units Subcutaneous Q8H Hillary Bow, MD   5,000 Units at 01/07/16 1454  . HYDROcodone-acetaminophen (NORCO/VICODIN) 5-325 MG per tablet 1-2 tablet  1-2 tablet Oral Q4H PRN Hillary Bow, MD   1 tablet at 01/07/16 1455  . insulin aspart (novoLOG) injection 0-9 Units  0-9  Units Subcutaneous TID AC & HS Lance Coon, MD   1 Units at 01/06/16 2139  . norepinephrine (LEVOPHED) 4mg  in D5W 271mL premix infusion  0-40 mcg/min Intravenous Titrated Harrie Foreman, MD 7.5 mL/hr at 01/07/16 1547 2 mcg/min at 01/07/16 1547  . ondansetron (ZOFRAN) tablet 4 mg  4 mg Oral Q6H PRN Hillary Bow, MD       Or  . ondansetron (ZOFRAN) injection 4 mg  4 mg Intravenous Q6H PRN Srikar Sudini, MD      . piperacillin-tazobactam (ZOSYN) IVPB 3.375 g  3.375 g Intravenous Q12H Srikar Sudini, MD   3.375 g at 01/07/16 1037  . polyethylene glycol (MIRALAX / GLYCOLAX) packet 17 g  17 g Oral Daily PRN Srikar Sudini, MD      . sodium chloride flush (NS) 0.9 % injection 3 mL  3 mL Intravenous Q12H Srikar Sudini, MD   3 mL at 01/07/16 1038   Facility-Administered Medications Ordered in Other Encounters  Medication Dose Route Frequency Provider Last Rate Last Dose  . 0.9 %  sodium chloride infusion   Intravenous Continuous Forest Gleason, MD   Stopped at 11/08/15 1144  . heparin lock flush 100 unit/mL  500 Units Intracatheter Once PRN Forest Gleason, MD      . heparin lock flush 100 unit/mL  500 Units Intravenous Once Forest Gleason, MD      . sodium chloride flush (NS) 0.9 % injection 10 mL  10 mL Intracatheter PRN Forest Gleason, MD   10 mL at 11/08/15 1052  . sodium chloride flush (NS) 0.9 % injection 10 mL  10 mL Intravenous PRN Forest Gleason, MD        Vital Signs: BP 134/48 mmHg  Pulse 98  Temp(Src) 97.7 F (36.5 C) (Oral)  Resp 22  Ht 5\' 8"  (1.727 m)  Wt 111.1 kg (244 lb 14.9 oz)  BMI 37.25 kg/m2   SpO2 98% Filed Weights   01/05/16 1600 01/06/16 0549 01/07/16 0440  Weight: 109.4 kg (241 lb 2.9 oz) 109.4 kg (241 lb 2.9 oz) 111.1 kg (244 lb 14.9 oz)    Estimated body mass index is 37.25 kg/(m^2) as calculated from the following:   Height as of this encounter: 5\' 8"  (1.727 m).   Weight as of this encounter: 111.1 kg (244 lb 14.9 oz).  PERFORMANCE STATUS (ECOG) : 4 - Bedbound  PHYSICAL EXAM: Lying in ICU bed, awake, but mildly confused  --(she asked nurse to help her get dressed so she won't be late for work today) EOMI OP clear No JVD or TM Hrt rrr no m Lungs cta no rales Abd soft and NT Ext no cyanosis or mottling  LABS: CBC:    Component Value Date/Time   WBC 17.2* 01/07/2016 0454   WBC 6.1 11/19/2013 0822   HGB 8.2* 01/07/2016 0454   HGB 11.8* 11/19/2013 0822   HCT 25.5* 01/07/2016 0454   HCT 35.7 11/19/2013 0822   PLT 114* 01/07/2016 0454   PLT 266 11/19/2013 0822   MCV 93.9 01/07/2016 0454   MCV 88 11/19/2013 0822   NEUTROABS 23.6* 01/05/2016 1330   NEUTROABS 3.4 01/14/2013 0621   LYMPHSABS 0.2* 01/05/2016 1330   LYMPHSABS 1.5 01/14/2013 0621   MONOABS 1.0* 01/05/2016 1330   MONOABS 0.5 01/14/2013 0621   EOSABS 0.0 01/05/2016 1330   EOSABS 0.2 01/14/2013 0621   BASOSABS 0.2* 01/05/2016 1330   BASOSABS 0.1 01/14/2013 0621   Comprehensive Metabolic Panel:    Component Value Date/Time  NA 140 01/07/2016 0454   NA 138 11/19/2013 0822   K 4.5 01/07/2016 0454   K 3.8 11/19/2013 0822   CL 114* 01/07/2016 0454   CL 103 11/19/2013 0822   CO2 17* 01/07/2016 0454   CO2 30 11/19/2013 0822   BUN 61* 01/07/2016 0454   BUN 18 11/19/2013 0822   CREATININE 3.68* 01/07/2016 0454   CREATININE 2.31* 11/19/2013 0822   GLUCOSE 202* 01/07/2016 0454   GLUCOSE 126* 11/19/2013 0822   CALCIUM 7.3* 01/07/2016 0454   CALCIUM 8.7 11/19/2013 0822   AST 29 01/06/2016 0527   AST 16 11/19/2013 0822   ALT 22 01/06/2016 0527   ALT 13 11/19/2013 0822   ALKPHOS 154*  01/06/2016 0527   ALKPHOS 94 11/19/2013 0822   BILITOT 0.8 01/06/2016 0527   BILITOT 0.4 11/19/2013 0822   PROT 5.8* 01/06/2016 0527   PROT 7.4 11/19/2013 0822   ALBUMIN 2.2* 01/06/2016 0527   ALBUMIN 3.3* 11/19/2013 0822    More than 50% of the visit was spent in counseling/coordination of care: Yes  Time Spent: 120 minutes

## 2016-01-07 NOTE — Progress Notes (Signed)
Pharmacy Antibiotic Note  Pamela Preston is a 82 y.o. female admitted on 01/05/2016 with sepsis.  Pharmacy has been consulted for vancomycin and Zosyn dosing. Vancomycin d/c 5/21 due to culture results.   Plan: Continue Zosyn 3.375g IV q12h (4 hour infusion).  Height: 5\' 8"  (172.7 cm) Weight: 244 lb 14.9 oz (111.1 kg) IBW/kg (Calculated) : 63.9  Temp (24hrs), Avg:98.2 F (36.8 C), Min:97.7 F (36.5 C), Max:98.8 F (37.1 C)   Recent Labs Lab 01/05/16 1330 01/05/16 1350 01/05/16 1708 01/06/16 0527 01/07/16 0454  WBC 25.0*  --   --  25.3* 17.2*  CREATININE 5.22*  --   --  4.01* 3.68*  LATICACIDVEN  --  4.8* 3.4*  --   --     Estimated Creatinine Clearance: 14.9 mL/min (by C-G formula based on Cr of 3.68).    Allergies  Allergen Reactions  . Colchicine Other (See Comments)    Unknown reaction  . Ketorolac Other (See Comments)    Unknown reaction  . Lactase Diarrhea  . Lipitor [Atorvastatin] Other (See Comments)    Unknown reaction    Antimicrobials this admission: Vancomycin  5/20 >> 5/21 Zosyn 5/20 >>   Dose adjustments this admission:   Microbiology results: BCx: Strep species, Klebsiella pneumoniae w/o KPC per BCID UCx: pending  5/20 MRSA PCR: negative   Thank you for allowing pharmacy to be a part of this patient's care.  Jeni Duling D 01/07/2016 8:32 AM

## 2016-01-08 ENCOUNTER — Ambulatory Visit: Payer: Medicare Other

## 2016-01-08 DIAGNOSIS — R1084 Generalized abdominal pain: Secondary | ICD-10-CM

## 2016-01-08 LAB — URINE CULTURE
Culture: >=100000 — AB
SPECIAL REQUESTS: NORMAL

## 2016-01-08 LAB — CBC
HEMATOCRIT: 24.7 % — AB (ref 35.0–47.0)
Hemoglobin: 8 g/dL — ABNORMAL LOW (ref 12.0–16.0)
MCH: 30.9 pg (ref 26.0–34.0)
MCHC: 32.4 g/dL (ref 32.0–36.0)
MCV: 95.3 fL (ref 80.0–100.0)
Platelets: 97 10*3/uL — ABNORMAL LOW (ref 150–440)
RBC: 2.59 MIL/uL — AB (ref 3.80–5.20)
RDW: 18.1 % — AB (ref 11.5–14.5)
WBC: 9 10*3/uL (ref 3.6–11.0)

## 2016-01-08 LAB — GLUCOSE, CAPILLARY
GLUCOSE-CAPILLARY: 145 mg/dL — AB (ref 65–99)
GLUCOSE-CAPILLARY: 196 mg/dL — AB (ref 65–99)
Glucose-Capillary: 103 mg/dL — ABNORMAL HIGH (ref 65–99)
Glucose-Capillary: 169 mg/dL — ABNORMAL HIGH (ref 65–99)

## 2016-01-08 LAB — BASIC METABOLIC PANEL
ANION GAP: 6 (ref 5–15)
BUN: 56 mg/dL — ABNORMAL HIGH (ref 6–20)
CALCIUM: 7.9 mg/dL — AB (ref 8.9–10.3)
CO2: 20 mmol/L — AB (ref 22–32)
Chloride: 118 mmol/L — ABNORMAL HIGH (ref 101–111)
Creatinine, Ser: 2.97 mg/dL — ABNORMAL HIGH (ref 0.44–1.00)
GFR, EST AFRICAN AMERICAN: 16 mL/min — AB (ref >60)
GFR, EST NON AFRICAN AMERICAN: 13 mL/min — AB (ref >60)
Glucose, Bld: 177 mg/dL — ABNORMAL HIGH (ref 65–99)
Potassium: 4.5 mmol/L (ref 3.5–5.1)
Sodium: 144 mmol/L (ref 135–145)

## 2016-01-08 MED ORDER — ENSURE ENLIVE PO LIQD
237.0000 mL | Freq: Three times a day (TID) | ORAL | Status: DC
  Administered 2016-01-08 – 2016-01-10 (×6): 237 mL via ORAL

## 2016-01-08 NOTE — Care Management (Signed)
Received patient from ICU.  she presented from [facility] where she was residing under a short term skilled plan of treatment with dx concerning of stemi.  CSW aware patient is from facility.  There is concern that patient's orientation status is fluctuating.   Code status has been  changed from DNR to Full code.  Palliative care is consulting.  Obtained order for physical therapy

## 2016-01-08 NOTE — Progress Notes (Signed)
Report called to receiving nurse. Pt in stable condition at this time. All belongings with pt. Family notified.

## 2016-01-08 NOTE — Progress Notes (Signed)
Initial Nutrition Assessment  DOCUMENTATION CODES:   Severe malnutrition in context of chronic illness  INTERVENTION:  -Cater to pt preferences, agree with Regular Diet order -Continue Ensure, add Magic Cup on meal trays as well -Assist with feedings at meal times -Send plastic ware on meal trays   NUTRITION DIAGNOSIS:   Malnutrition related to chronic illness as evidenced by percent weight loss, energy intake < or equal to 75% for > or equal to 1 month, severe depletion of muscle mass, moderate depletions of muscle mass.  GOAL:   Patient will meet greater than or equal to 90% of their needs   MONITOR:   PO intake, Supplement acceptance  REASON FOR ASSESSMENT:   Malnutrition Screening Tool    ASSESSMENT:   82 yo female admitted with septic shock, AMS, acute on chronic renal failure, FTT ; pt with stage IV pancreatic cancer receiving chemo and radiation  Pt with very flat affect on visit today. Pt reports very poor appetite; reports not able to eat 1 meal per day, eating bites a sips. Pt reports she likes Ensure, reports she is drinking but noted several unopened Ensure in pt and room and per Clyde Canterbury RN pt has not been drinking. Pt reports she has lost weight but does not know how much. Pt reports UBW of 317 pounds in December of 2016 when she started treatment (22.3% wt loss in <1 year). Per weight encounters, 7.5% wt loss in 2 months. Noted pt met criteria for severe malnutrition in acute illness on admission in January 2017.  Pt with normal physical exam on previous admission. Currently Nutrition-Focused physical exam with mild/moderate fat depletion, mild to severe muscle depletion, and mild edema.  Pt also reports severe weakness, reports she has not gotten up in a month.   Past Medical History  Diagnosis Date  . Hypertension   . GERD (gastroesophageal reflux disease)   . Diabetes mellitus without complication ([place])   . Breast cancer ([place]) 2013    c Lumpectomy 2013  .  Uterine cancer ([place]) 1960  . Pancreatic cancer ([place])   . Abnormal LFTs   . Gout   . Thyroid disease   . Hyperlipidemia     Diet Order:  Diet regular Room service appropriate?: Yes; Fluid consistency:: Thin   Energy Intake: recorded po intake 0-20% of meal trays  Skin:  Reviewed, no issues  Last BM:  01/08/16   Labs: reviewed  Meds: NS at 50 ml/hr, ss novolog  Height:   Ht Readings from Last 1 Encounters:  01/05/16 5' 8"  (1.727 m)    Weight:   Wt Readings from Last 1 Encounters:  01/08/16 246 lb 0.5 oz (111.6 kg)     Wt Readings from Last 10 Encounters:   01/08/16 246 lb 0.5 oz (111.6 kg)   12/18/15 252 lb (114.306 kg)   12/06/15 252 lb 15 oz (114.732 kg)   11/29/15 262 lb (118.842 kg)   11/20/15 257 lb 3.2 oz (116.665 kg)   11/15/15 259 lb 14.8 oz (117.9 kg)   11/08/15 264 lb 3 oz (119.835 kg)   11/05/15 265 lb (120.203 kg)   10/29/15 266 lb 8.6 oz (120.9 kg)   10/29/15 266 lb 6.8 oz (120.85 kg)    BMI:  Body mass index is 37.42 kg/(m^2).  Estimated Nutritional Needs:   Kcal:  2000-2200 kcals   Protein:  111-133 g  Fluid:  >2 L per day  EDUCATION NEEDS:   No education needs identified at this time  Kerman [employer], [place], LDN [phone] Pager  [phone] Weekend/On-Call Pager

## 2016-01-08 NOTE — Progress Notes (Signed)
[place] INFECTIOUS DISEASE PROGRESS NOTE Date of Admission:  01/05/2016     ID: Pamela Preston is a 82 y.o. female with  Mixed bacteremia, panc cancer, portacath in place  Active Problems:   Septic shock ([place])   Acute on chronic renal failure (H[facility])   Lactic acidosis   Arterial hypotension   Poor appetite   Dehydration   Abdominal pain   Subjective: No fevers, wbc decreasing  ROS  Eleven systems are reviewed and negative except per hpi  Medications:  Antibiotics Given (last 72 hours)    Date/Time Action Medication Dose Rate   01/05/16 2057 Given   vancomycin (VANCOCIN) IVPB 1000 mg/200 mL premix 1,000 mg 200 mL/hr   01/05/16 2137 Given   piperacillin-tazobactam (ZOSYN) IVPB 3.375 g 3.375 g 12.5 mL/hr   01/06/16 0935 Given   piperacillin-tazobactam (ZOSYN) IVPB 3.375 g 3.375 g 12.5 mL/hr   01/06/16 2140 Given   piperacillin-tazobactam (ZOSYN) IVPB 3.375 g 3.375 g 12.5 mL/hr   01/07/16 1037 Given   piperacillin-tazobactam (ZOSYN) IVPB 3.375 g 3.375 g 12.5 mL/hr   01/07/16 2220 Given   piperacillin-tazobactam (ZOSYN) IVPB 3.375 g 3.375 g 12.5 mL/hr   01/08/16 0930 Given   piperacillin-tazobactam (ZOSYN) IVPB 3.375 g 3.375 g 12.5 mL/hr     . docusate sodium  100 mg Oral BID  . feeding supplement (ENSURE ENLIVE)  237 mL Oral TID AC & HS  . heparin  5,000 Units Subcutaneous Q8H  . insulin aspart  0-9 Units Subcutaneous TID AC & HS  . piperacillin-tazobactam (ZOSYN)  IV  3.375 g Intravenous Q12H  . sodium chloride flush  3 mL Intravenous Q12H    Objective: Vital signs in last 24 hours: Temp:  [97.3 F (36.3 C)-97.9 F (36.6 C)] 97.3 F (36.3 C) (05/23 0800) Pulse Rate:  [49-98] 82 (05/23 0600) Resp:  [11-26] 13 (05/23 0600) BP: (89-134)/(35-56) 106/45 mmHg (05/23 0600) SpO2:  [97 %-100 %] 98 % (05/23 0600) Weight:  [111.6 kg (246 lb 0.5 oz)] 111.6 kg (246 lb 0.5 oz) (05/23 0535) Constitutional: oriented to person, place, and time. Chronically ill  appearing HENT: /AT, PERRLA, no scleral icterus Mouth/Throat: Oropharynx is clear and dry . No oropharyngeal exudate. Portacath R chest wall wnl  Cardiovascular: Normal rate, regular rhythm and normal heart sounds.  Pulmonary/Chest: Effort normal and breath sounds normal.  Neck supple, no nuchal rigidity Abdominal: Soft. Mild ttp over R flank Lymphadenopathy: no cervical adenopathy. No axillary adenopathy Neurological: alert and oriented to person, place, and time.  Skin: Skin is warm and dry. No rash noted. No erythema.  Psychiatric: a normal mood and affect. behavior is normal.  Foley in place  Lab Results  Recent Labs  01/07/16 0454 01/08/16 1257  WBC 17.2* 9.0  HGB 8.2* 8.0*  HCT 25.5* 24.7*  NA 140 144  K 4.5 4.5  CL 114* 118*  CO2 17* 20*  BUN 61* 56*  CREATININE 3.68* 2.97*    Microbiology: Results for orders placed or performed during the hospital encounter of 01/05/16  Blood culture (routine x 2)     Status: Abnormal (Preliminary result)   Collection Time: 01/05/16  1:50 PM  Result Value Ref Range Status   Specimen Description BLOOD RIGHT ASSIST CONTROL  Final   Special Requests BOTTLES DRAWN AEROBIC AND ANAEROBIC 1 CC  Final   Culture  Setup Time   Final    GRAM POSITIVE COCCI IN BOTH AEROBIC AND ANAEROBIC BOTTLES CRITICAL VALUE NOTED.  VALUE IS CONSISTENT  WITH PREVIOUSLY REPORTED AND CALLED VALUE. CONFIRMED BY Emma    Culture (A)  Final    STREPTOCOCCUS SPECIES IN BOTH AEROBIC AND ANAEROBIC BOTTLES SUSCEPTIBILITIES TO FOLLOW    Report Status PENDING  Incomplete  Blood culture (routine x 2)     Status: Abnormal (Preliminary result)   Collection Time: 01/05/16  1:55 PM  Result Value Ref Range Status   Specimen Description BLOOD LEFT ASSIST CONTROL  Final   Special Requests BOTTLES DRAWN AEROBIC AND ANAEROBIC 7 CC  Final   Culture  Setup Time   Final    GRAM POSITIVE COCCI ANAEROBIC BOTTLE ONLY CRITICAL RESULT CALLED TO, READ BACK BY AND VERIFIED  WITH: MATT MCBANE AT 0530 ON 01/06/16 RWW CONFIRMED BY [facility] GRAM NEGATIVE RODS AEROBIC BOTTLE ONLY CRITICAL RESULT CALLED TO, READ BACK BY AND VERIFIED WITH: SCOTT CHRISTY AT 0825 ON 01/06/16.[place] Kitchen[place] Kitchen[place]    Culture (A)  Final    STREPTOCOCCUS SPECIES ANAEROBIC BOTTLE ONLY KLEBSIELLA PNEUMONIAE AEROBIC BOTTLE ONLY SUSCEPTIBILITIES TO FOLLOW    Report Status PENDING  Incomplete  Blood Culture ID Panel (Reflexed)     Status: Abnormal   Collection Time: 01/05/16  1:55 PM  Result Value Ref Range Status   Enterococcus species NOT DETECTED NOT DETECTED Final   Vancomycin resistance NOT DETECTED NOT DETECTED Final   Listeria monocytogenes NOT DETECTED NOT DETECTED Final   Staphylococcus species NOT DETECTED NOT DETECTED Final   Staphylococcus aureus NOT DETECTED NOT DETECTED Final   Methicillin resistance NOT DETECTED NOT DETECTED Final   Streptococcus species DETECTED (A) NOT DETECTED Final    Comment: CRITICAL RESULT CALLED TO, READ BACK BY AND VERIFIED WITH: CALLED MATT MCBANE AT 0530 ON 01/06/16 RWW    Streptococcus agalactiae NOT DETECTED NOT DETECTED Final   Streptococcus pneumoniae NOT DETECTED NOT DETECTED Final   Streptococcus pyogenes NOT DETECTED NOT DETECTED Final   Acinetobacter baumannii NOT DETECTED NOT DETECTED Final   Enterobacteriaceae species NOT DETECTED NOT DETECTED Final   Enterobacter cloacae complex NOT DETECTED NOT DETECTED Final   Escherichia coli NOT DETECTED NOT DETECTED Final   Klebsiella oxytoca NOT DETECTED NOT DETECTED Final   Klebsiella pneumoniae NOT DETECTED NOT DETECTED Final   Proteus species NOT DETECTED NOT DETECTED Final   Serratia marcescens NOT DETECTED NOT DETECTED Final   Carbapenem resistance NOT DETECTED NOT DETECTED Final   Haemophilus influenzae NOT DETECTED NOT DETECTED Final   Neisseria meningitidis NOT DETECTED NOT DETECTED Final   Pseudomonas aeruginosa NOT DETECTED NOT DETECTED Final   Candida albicans NOT DETECTED NOT DETECTED Final    Candida glabrata NOT DETECTED NOT DETECTED Final   Candida krusei NOT DETECTED NOT DETECTED Final   Candida parapsilosis NOT DETECTED NOT DETECTED Final   Candida tropicalis NOT DETECTED NOT DETECTED Final  Urine culture     Status: Abnormal   Collection Time: 01/05/16  5:48 PM  Result Value Ref Range Status   Specimen Description URINE, CATHETERIZED  Final   Special Requests Normal  Final   Culture >=100,000 COLONIES/mL PROTEUS MIRABILIS (A)  Final   Report Status 01/08/2016 FINAL  Final   Organism ID, Bacteria PROTEUS MIRABILIS (A)  Final      Susceptibility   Proteus mirabilis - MIC*    AMPICILLIN <=2 SENSITIVE Sensitive     CEFAZOLIN 8 SENSITIVE Sensitive     CEFTRIAXONE <=1 SENSITIVE Sensitive     CIPROFLOXACIN >=4 RESISTANT Resistant     GENTAMICIN <=1 SENSITIVE Sensitive     IMIPENEM 4 SENSITIVE Sensitive  NITROFURANTOIN 128 RESISTANT Resistant     TRIMETH/SULFA <=20 SENSITIVE Sensitive     AMPICILLIN/SULBACTAM <=2 SENSITIVE Sensitive     PIP/TAZO <=4 SENSITIVE Sensitive     * >=100,000 COLONIES/mL PROTEUS MIRABILIS  MRSA PCR Screening     Status: None   Collection Time: 01/05/16  5:49 PM  Result Value Ref Range Status   MRSA by PCR NEGATIVE NEGATIVE Final    Comment:        The GeneXpert MRSA Assay (FDA approved for NASAL specimens only), is one component of a comprehensive MRSA colonization surveillance program. It is not intended to diagnose MRSA infection nor to guide or monitor treatment for MRSA infections.   Blood Culture ID Panel (Reflexed)     Status: Abnormal   Collection Time: 01/06/16  8:00 AM  Result Value Ref Range Status   Enterococcus species NOT DETECTED NOT DETECTED Final   Vancomycin resistance NOT DETECTED NOT DETECTED Final   Listeria monocytogenes NOT DETECTED NOT DETECTED Final   Staphylococcus species NOT DETECTED NOT DETECTED Final   Staphylococcus aureus NOT DETECTED NOT DETECTED Final   Methicillin resistance NOT DETECTED NOT  DETECTED Final   Streptococcus species DETECTED (A) NOT DETECTED Final    Comment: CRITICAL RESULT CALLED TO, READ BACK BY AND VERIFIED WITH: SCOTT CHRISTY 01/06/16 0825 [place]    Streptococcus agalactiae NOT DETECTED NOT DETECTED Final   Streptococcus pneumoniae NOT DETECTED NOT DETECTED Final   Streptococcus pyogenes NOT DETECTED NOT DETECTED Final   Acinetobacter baumannii NOT DETECTED NOT DETECTED Final   Enterobacteriaceae species DETECTED (A) NOT DETECTED Final    Comment: CRITICAL RESULT CALLED TO, READ BACK BY AND VERIFIED WITH: SCOTT CHRISTY 01/06/16 0825 [place]     Enterobacter cloacae complex NOT DETECTED NOT DETECTED Final   Escherichia coli NOT DETECTED NOT DETECTED Final   Klebsiella oxytoca NOT DETECTED NOT DETECTED Final   Klebsiella pneumoniae DETECTED (A) NOT DETECTED Final    Comment: CRITICAL RESULT CALLED TO, READ BACK BY AND VERIFIED WITH: SCOTT CHRISTY 01/06/16 0825 [place]    Proteus species NOT DETECTED NOT DETECTED Final   Serratia marcescens NOT DETECTED NOT DETECTED Final   Carbapenem resistance NOT DETECTED NOT DETECTED Final   Haemophilus influenzae NOT DETECTED NOT DETECTED Final   Neisseria meningitidis NOT DETECTED NOT DETECTED Final   Pseudomonas aeruginosa NOT DETECTED NOT DETECTED Final   Candida albicans NOT DETECTED NOT DETECTED Final   Candida glabrata NOT DETECTED NOT DETECTED Final   Candida krusei NOT DETECTED NOT DETECTED Final   Candida parapsilosis NOT DETECTED NOT DETECTED Final   Candida tropicalis NOT DETECTED NOT DETECTED Final  Culture, blood (single) w Reflex to ID Panel     Status: None (Preliminary result)   Collection Time: 01/07/16  5:05 PM  Result Value Ref Range Status   Specimen Description BLOOD A-LINE DRAW  Final   Special Requests BOTTLES DRAWN AEROBIC AND ANAEROBIC  5CC  Final   Culture NO GROWTH < 24 HOURS  Final   Report Status PENDING  Incomplete  Culture, blood (single) w Reflex to ID Panel     Status: None (Preliminary result)    Collection Time: 01/07/16  5:37 PM  Result Value Ref Range Status   Specimen Description BLOOD RIGHT ASSIST CONTROL  Final   Special Requests   Final    BOTTLES DRAWN AEROBIC AND ANAEROBIC  AERO12CC ANA5CC   Culture NO GROWTH < 24 HOURS  Final   Report Status PENDING  Incomplete    Studies/Results:  Dg Abd 1 View  01/07/2016  CLINICAL DATA:  Abdominal pain, stage IV pancreatic cancer, currently on chemotherapy and radiation therapy, history ovarian cancer, chronic kidney disease, hypertension, diabetes mellitus, altered level of consciousness, fall, hypoglycemia EXAM: ABDOMEN - 1 VIEW COMPARISON:  Portable exam 1557 hours compared to CT abdomen and pelvis 01/05/2016 FINDINGS: Stool and gas in rectum. CBD wall stent in RIGHT upper quadrant with associated pneumobilia. Nonobstructive bowel gas pattern. Mild gaseous distention of stomach. No bowel wall thickening or ventral abdominal mass effect. Multilevel degenerative disc disease changes of the thoracolumbar spine. Degenerative changes of both hip joints greater on RIGHT. IMPRESSION: Nonobstructive bowel gas pattern. Electronically Signed   By: Lavonia Dana M.D.   On: 01/07/2016 16:25   Dg Pelvis Portable  01/07/2016  CLINICAL DATA:  Abdominal pain EXAM: PORTABLE PELVIS 1-2 VIEWS COMPARISON:  Portable exam 1557 hours compared to 2 CT abdomen and pelvis 01/05/2016 FINDINGS: Degenerative changes of both hip joints greater on RIGHT, which demonstrates a greater degree of joint space narrowing. No acute fracture, dislocation or bone destruction. Pelvis grossly intact. Degenerative disc and facet disease changes at visualized lower lumbar spine IMPRESSION: Degenerative changes of the hip joints and visualized lower lumbar spine. No acute abnormalities. Electronically Signed   By: Lavonia Dana M.D.   On: 01/07/2016 16:26    Assessment/Plan: Pamela Preston is a 82 y.o. female stage IV pancreatic cancer currently on chemo and radiation, ovarian cancer, CKD  stage IV, hypertension and diabetes who presented to the ED with altered level of consciousness, fall and hypoglycemia with blood glucose in the 40s. She has since been found to have bacteremia with strep species and Klebsiella and ucx with proteus. She has a portacath in place. Also had a biliary stent with CT this admission showing no evidence obstruction or biliary infection. On admission wbc was 25, down now to 17. Has been on vanco and zosyn.  Fu cx 5/22 Pending one from port and one from PV Unclear if bacteremia is from the portacath or from GI/biliary source. Clinically improving. Hesitant to remove port without definitive need. I do think like GI source  Recommendations Continue current abx pending final Culture ID Repeat bcx x 2 (one from port and one from peripheral) Thank you very much for the consult. Will follow with you.  Tamarcus Condie P   01/08/2016, 1:35 PM

## 2016-01-08 NOTE — Progress Notes (Signed)
[facility] NAME: Pamela Preston    MR#:  [id number]  DATE OF BIRTH:  [birth year]  SUBJECTIVE:   Much more awake and alert today.  Off vasopressors.  Code status has been reversed as per discussion w/ Palliative Care.  No complaints presently.   REVIEW OF SYSTEMS:    Review of Systems  Constitutional: Negative for fever and chills.  HENT: Negative for congestion and tinnitus.   Eyes: Negative for blurred vision and double vision.  Respiratory: Negative for cough, shortness of breath and wheezing.   Cardiovascular: Negative for chest pain, orthopnea and PND.  Gastrointestinal: Negative for nausea, vomiting, abdominal pain and diarrhea.  Genitourinary: Negative for dysuria and hematuria.  Neurological: Negative for dizziness, sensory change and focal weakness.  All other systems reviewed and are negative.   Nutrition: Regular Tolerating Diet: Yes Tolerating PT: Await Eval.   DRUG ALLERGIES:   Allergies  Allergen Reactions  . Colchicine Other (See Comments)    Unknown reaction  . Ketorolac Other (See Comments)    Unknown reaction  . Lactase Diarrhea  . Lipitor [Atorvastatin] Other (See Comments)    Unknown reaction    VITALS:  Blood pressure 139/52, pulse 104, temperature 97.4 F (36.3 C), temperature source Oral, resp. rate 17, height 5\' 8"  (1.727 m), weight 111.6 kg (246 lb 0.5 oz), SpO2 99 %.  PHYSICAL EXAMINATION:   Physical Exam  GENERAL:  82 y.o.-year-old obese patient lying in the bed in no acute distress.  EYES: Pupils equal, round, reactive to light and accommodation. No scleral icterus. Extraocular muscles intact.  HEENT: Head atraumatic, normocephalic. Oropharynx and nasopharynx clear.  NECK:  Supple, no jugular venous distention. No thyroid enlargement, no tenderness.  LUNGS: Normal breath sounds bilaterally, no wheezing, rales, rhonchi. No use of accessory muscles of respiration.  CARDIOVASCULAR: S1, S2  normal. No murmurs, rubs, or gallops.  ABDOMEN: Soft, slightly tender in RUQ area, no rebound rigidity, nondistended. Bowel sounds present. No organomegaly or mass.  EXTREMITIES: No cyanosis, clubbing or edema b/l.    NEUROLOGIC: Cranial nerves II through XII are intact. No focal Motor or sensory deficits b/l.  Globally weak PSYCHIATRIC: The patient is alert and oriented x 2.  SKIN: No obvious rash, lesion, or ulcer.    LABORATORY PANEL:   CBC  Recent Labs Lab 01/08/16 1257  WBC 9.0  HGB 8.0*  HCT 24.7*  PLT 97*   ------------------------------------------------------------------------------------------------------------------  Chemistries   Recent Labs Lab 01/06/16 0527  01/08/16 1257  NA 140  < > 144  K 4.5  < > 4.5  CL 113*  < > 118*  CO2 18*  < > 20*  GLUCOSE 245*  < > 177*  BUN 58*  < > 56*  CREATININE 4.01*  < > 2.97*  CALCIUM 7.6*  < > 7.9*  AST 29  --   --   ALT 22  --   --   ALKPHOS 154*  --   --   BILITOT 0.8  --   --   < > = values in this interval not displayed. ------------------------------------------------------------------------------------------------------------------  Cardiac Enzymes  Recent Labs Lab 01/06/16 0527  TROPONINI 0.09*   ------------------------------------------------------------------------------------------------------------------  RADIOLOGY:  Dg Abd 1 View  01/07/2016  CLINICAL DATA:  Abdominal pain, stage IV pancreatic cancer, currently on chemotherapy and radiation therapy, history ovarian cancer, chronic kidney disease, hypertension, diabetes mellitus, altered level of consciousness, fall, hypoglycemia EXAM: ABDOMEN - 1 VIEW COMPARISON:  Portable  exam 1557 hours compared to CT abdomen and pelvis 01/05/2016 FINDINGS: Stool and gas in rectum. CBD wall stent in RIGHT upper quadrant with associated pneumobilia. Nonobstructive bowel gas pattern. Mild gaseous distention of stomach. No bowel wall thickening or ventral abdominal  mass effect. Multilevel degenerative disc disease changes of the thoracolumbar spine. Degenerative changes of both hip joints greater on RIGHT. IMPRESSION: Nonobstructive bowel gas pattern. Electronically Signed   By: Lavonia Dana M.D.   On: 01/07/2016 16:25   Dg Pelvis Portable  01/07/2016  CLINICAL DATA:  Abdominal pain EXAM: PORTABLE PELVIS 1-2 VIEWS COMPARISON:  Portable exam 1557 hours compared to 2 CT abdomen and pelvis 01/05/2016 FINDINGS: Degenerative changes of both hip joints greater on RIGHT, which demonstrates a greater degree of joint space narrowing. No acute fracture, dislocation or bone destruction. Pelvis grossly intact. Degenerative disc and facet disease changes at visualized lower lumbar spine IMPRESSION: Degenerative changes of the hip joints and visualized lower lumbar spine. No acute abnormalities. Electronically Signed   By: Lavonia Dana M.D.   On: 01/07/2016 16:26     ASSESSMENT AND PLAN:   82 year old female with past medical history of pancreatic cancer, chronic kidney disease stage III, diabetes, history of breast, uterine cancer, hyperlipidemia, GERD, recent admission for Escherichia coli sepsis status post biliary stent placement who presents to the hospital due to altered mental status.  1. Sepsis-source unclear. Patient had a recent admission secondary to Escherichia coli bacteremia and cholangitis. -Her LFTs are currently stable and her CT scan of abdomen pelvis on admission did not show any acute abnormality. -Continue broad-spectrum IV antibiotics with Zosyn. Blood Culture is presently undergoing Klebsiella, Streptococcus. Urine culture growing Proteus -Improved, off vasopressors. Continue IV fluids. Appreciate infectious disease input and continue current antibiotics. Repeat blood cultures have been drawn 1 from Port-A-Cath 1 peripherally and will follow up. -Continue supportive care with IV fluids, IV antibiotics as mentioned.  2. Altered mental status-metabolic  encephalopathy due to sepsis/hypoglycemia. -Much improved mental status is stable today. - cont. Supportive care to treat underlying sepsis, BS stable.   3. Abdominal/Hip pain - etiology unclear.  Non-specific.  -Improved, x-ray of the abdomen, pelvis was negative yesterday.  4. Hypoglycemia-secondary to sepsis. Resolved - follow BS  5. Leukocytosis-secondary to sepsis. -Now normalized  6. Pancreatic cancer stage IV-status post recent biliary stent placement. LFTs currently stable. No acute abdominal pain.  - appreciate Palliative care consult and pt. Has reversed her code status to FULL CODE.    7. Acute on chronic renal failure-this is secondary to ATN and sepsis. Baseline creatinine is around 2.2-3 and patient presented with a creatinine of 5.2.  - improving w/ IV fluids and cont. To monitor.   Transfer to floor today.  PT consult.     All the records are reviewed and case discussed with Care Management/Social Workerr. Management plans discussed with the patient, family and they are in agreement.  CODE STATUS: DO NOT RESUSCITATE  DVT Prophylaxis: Ted's & SCD's.   TOTAL Critical Care TIME TAKING CARE OF THIS PATIENT: 30 minutes.   POSSIBLE D/C IN 2-3 DAYS, DEPENDING ON CLINICAL CONDITION.   Henreitta Leber M.D on 01/08/2016 at 3:12 PM  Between 7am to 6pm - Pager - [phone]  After 6pm go to [email or web address] - password [email or web address] [facility]  Office  [phone]  CC: Primary care physician; Raylene Miyamoto., MD

## 2016-01-08 NOTE — Consult Note (Signed)
PULMONARY / CRITICAL CARE MEDICINE   Name: Pamela Preston MRN: [id number] DOB: 03-07-1930    ADMISSION DATE:  01/05/2016   CONSULTATION DATE:  01/06/2016  REFERRING MD:  Dr. Verdell Carmine  CHIEF COMPLAINT:  Hypoglycemia and altered mental status  HISTORY OF PRESENT ILLNESS:   82 yo AA female with a PMH of stage IV pancreatic cancer currently on chemo and radiation, ovarian cancer, CKD stage IV, hypertension and diabetes who presented to the ED with altered level of consciousness, fall and hypoglycemia with blood glucose in the 40s. History is obtained from patient's chart as patient is still lethargic. She was treated for hypoglycemia in the field by EMS. EMS also obtained an EKG that suggested a STEMI however, the repeat EKG was unremarkable. At the ED, she continued to be hypotensive. She was given fluid boluses and started on a low dose of levophed. She admitted to the ICU for sepsis of unknown source and septic shock.  PCCM was consulted for further management.    SUBJECTIVE: More awake and alert today. Had discussion with Palliative care yesterday, patient alert and oriented, made herself FULL CODE.    PAST MEDICAL HISTORY :  She  has a past medical history of Hypertension; GERD (gastroesophageal reflux disease); Diabetes mellitus without complication ([place]); Breast cancer ([place]) (2013); Uterine cancer ([place]) (1960); Pancreatic cancer ([place]); Abnormal LFTs; Gout; Thyroid disease; and Hyperlipidemia.  PAST SURGICAL HISTORY: She  has past surgical history that includes Abdominal hysterectomy; Knee arthroscopy; ERCP (N/A, 09/04/2015); Breast biopsy (Right, 2012); Upper esophageal endoscopic ultrasound (eus) (N/A, 10/04/2015); Cardiac catheterization (N/A, 11/05/2015); and ERCP (N/A, 11/22/2015).  Allergies  Allergen Reactions  . Colchicine Other (See Comments)    Unknown reaction  . Ketorolac Other (See Comments)    Unknown reaction  . Lactase Diarrhea  . Lipitor [Atorvastatin] Other (See  Comments)    Unknown reaction    Current Facility-Administered Medications on File Prior to Encounter  Medication  . 0.9 %  sodium chloride infusion  . heparin lock flush 100 unit/mL  . heparin lock flush 100 unit/mL  . sodium chloride flush (NS) 0.9 % injection 10 mL  . sodium chloride flush (NS) 0.9 % injection 10 mL   Current Outpatient Prescriptions on File Prior to Encounter  Medication Sig  . amLODipine (NORVASC) 10 MG tablet Take 10 mg by mouth daily.  [name] buPROPion (WELLBUTRIN SR) 150 MG 12 hr tablet Take 150 mg by mouth 2 (two) times daily. Reported on 12/06/2015  . furosemide (LASIX) 20 MG tablet Take 20 mg by mouth daily.  [name] glipiZIDE (GLUCOTROL) 5 MG tablet Take 5 mg by mouth daily before breakfast.   . HYDROcodone-acetaminophen (NORCO/VICODIN) 5-325 MG tablet Take 1 tablet by mouth every 6 (six) hours as needed for moderate pain.  [name] letrozole (FEMARA) 2.5 MG tablet Take 2.5 mg by mouth daily.  [name] levothyroxine (SYNTHROID, LEVOTHROID) 75 MCG tablet Take 75 mcg by mouth daily before breakfast.  . loperamide (IMODIUM) 2 MG capsule Take 1 capsule by mouth every 6 hours as needed for diarrhea.  [name] omeprazole (PRILOSEC) 20 MG capsule Take 20 mg by mouth daily.  . potassium chloride 20 MEQ TBCR Take 20 mEq by mouth 2 (two) times daily.  . pravastatin (PRAVACHOL) 40 MG tablet Take 40 mg by mouth daily.  . promethazine (PHENERGAN) 25 MG tablet Take 25 mg by mouth every 6 (six) hours.     FAMILY HISTORY:  Her indicated that her mother is deceased. She indicated that her brother  is deceased.   SOCIAL HISTORY: She  reports that she has never smoked. She does not have any smokeless tobacco history on file. She reports that she does not drink alcohol or use illicit drugs.  REVIEW OF SYSTEMS:   No fever, no chills Dec appetite No sob No chest pain  VITAL SIGNS: BP 106/45 mmHg  Pulse 82  Temp(Src) 97.3 F (36.3 C) (Oral)  Resp 13  Ht 5\' 8"  (1.727 m)  Wt 246 lb 0.5 oz (111.6 kg)   BMI 37.42 kg/m2  SpO2 98%  HEMODYNAMICS:    VENTILATOR SETTINGS:    INTAKE / OUTPUT: I/O last 3 completed shifts: In: 2450.3 [P.O.:200; I.V.:2100.3; IV Piggyback:150] Out: 676 [Urine:676]  PHYSICAL EXAMINATION:  General: NAD Neuro: alert and awake.  HEENT:  PERRLA, trachea midline Cardiovascular: RRR, s1/s2, no MRG Lungs:  Bilateral airflow, +rhonchi, no whexes Abdomen:  Obese, soft, normal bowel sounds; mild diffuse tenderness on palpation Musculoskeletal:  No deformities, +rom Extremities: +2 pulses, +1 edema bilaterally Skin:  Warm and dry  LABS:  BMET  Recent Labs Lab 01/05/16 1330 01/06/16 0527 01/07/16 0454  NA 140 140 140  K 4.7 4.5 4.5  CL 111 113* 114*  CO2 17* 18* 17*  BUN 64* 58* 61*  CREATININE 5.22* 4.01* 3.68*  GLUCOSE 199* 245* 202*    Electrolytes  Recent Labs Lab 01/05/16 1330 01/06/16 0527 01/07/16 0454  CALCIUM 7.9* 7.6* 7.3*    CBC  Recent Labs Lab 01/05/16 1330 01/06/16 0527 01/07/16 0454  WBC 25.0* 25.3* 17.2*  HGB 9.0* 9.6* 8.2*  HCT 28.0* 30.1* 25.5*  PLT 156 136* 114*    Coag's  Recent Labs Lab 01/06/16 0527  INR 1.24    Sepsis Markers  Recent Labs Lab 01/05/16 1350 01/05/16 1708  LATICACIDVEN 4.8* 3.4*    ABG No results for input(s): PHART, PCO2ART, PO2ART in the last 168 hours.  Liver Enzymes  Recent Labs Lab 01/05/16 1330 01/06/16 0527  AST 33 29  ALT 18 22  ALKPHOS 156* 154*  BILITOT 0.6 0.8  ALBUMIN 2.2* 2.2*    Cardiac Enzymes  Recent Labs Lab 01/05/16 1330 01/06/16 0527  TROPONINI 0.07* 0.09*    Glucose  Recent Labs Lab 01/07/16 0722 01/07/16 1117 01/07/16 1625 01/07/16 2105 01/08/16 0724 01/08/16 1123  GLUCAP 163* 197* 200* 178* 103* 145*    Imaging Dg Abd 1 View  01/07/2016  CLINICAL DATA:  Abdominal pain, stage IV pancreatic cancer, currently on chemotherapy and radiation therapy, history ovarian cancer, chronic kidney disease, hypertension, diabetes  mellitus, altered level of consciousness, fall, hypoglycemia EXAM: ABDOMEN - 1 VIEW COMPARISON:  Portable exam 1557 hours compared to CT abdomen and pelvis 01/05/2016 FINDINGS: Stool and gas in rectum. CBD wall stent in RIGHT upper quadrant with associated pneumobilia. Nonobstructive bowel gas pattern. Mild gaseous distention of stomach. No bowel wall thickening or ventral abdominal mass effect. Multilevel degenerative disc disease changes of the thoracolumbar spine. Degenerative changes of both hip joints greater on RIGHT. IMPRESSION: Nonobstructive bowel gas pattern. Electronically Signed   By: Lavonia Dana M.D.   On: 01/07/2016 16:25   Dg Pelvis Portable  01/07/2016  CLINICAL DATA:  Abdominal pain EXAM: PORTABLE PELVIS 1-2 VIEWS COMPARISON:  Portable exam 1557 hours compared to 2 CT abdomen and pelvis 01/05/2016 FINDINGS: Degenerative changes of both hip joints greater on RIGHT, which demonstrates a greater degree of joint space narrowing. No acute fracture, dislocation or bone destruction. Pelvis grossly intact. Degenerative disc and facet disease changes at  visualized lower lumbar spine IMPRESSION: Degenerative changes of the hip joints and visualized lower lumbar spine. No acute abnormalities. Electronically Signed   By: Lavonia Dana M.D.   On: 01/07/2016 16:26    STUDIES: None  CULTURES: 01/05/16 Blood x2 Urine   ANTIBIOTICS: 01/05/2016 Vancomycin Zosyn  SIGNIFICANT EVENTS: 05/20: Admitted with sepsis and septic shock  LINES/TUBES: Right chest wall port  DISCUSSION: 82 yo AAF with stage IV pancreatic cancer presenting with sepsis, septic shock necessitating pressors, now weaned off, Acute on chronic kidney disease and failure to thrive  ASSESSMENT / PLAN:  CARDIOVASCULAR A:  Septic shock-resolving H/o Hypertension H/O hyperlipidemia P:  -Hemodynamics per ICU protocol -IV fluids and low dose levophed,now weaned off; titrate to MAP goal of 65 -Will hold off on central venous  catheter placement for now ( as long as norepinephrine dose is low and requirements are decreasing, peripheral access will probably suffice) -OK to access mediport -Hold all antihypertensives -Cycle cardiac enzymes -stable to transfer out of the ICU -patient now FULL CODE  RENAL A:   Acute on chronic renal failure-baseline creatinine 2-3; now 5.22 P:   -iv fluids -monitor I's and O's -Monitor BMP  GASTROINTESTINAL A:  Abdominal pain  Adult failure to thrive 2/2 poor oral intake, disease process and treatment side effects H/O GERD P:   -encourage oral intake as tolerated -Ensure 1 can qid -Continue home meds -US abdomen today  HEMATOLOGIC A:   Carcinoma of head of pancreas positive for adenocarcinoma, EUS staging is T2 N0 M0 H/O uterine and breast cancer P:  -Oncology following -Will request palliative care consult  INFECTIOUS A:   Sepsis of unknown source  P:   -Empiric antibiotics -F/U cultures -f/u ID recs -improving WBC and clinical status, most likely not mediport as source.   ENDOCRINE A:   Hypoglycemia likely due to poor oral intake Hypothyroidism P:   -Hold oral hypoglycemic agents -Monitor blood glucose q4h without SS coverage - Check TSH  NEUROLOGIC A:   Acute metabolic encephalopathy 2/2 sepsis and hypoglycemia P:   RASS goal:  -Monitor neuro status  Thank you for consulting [facility], we will signoff at this time.  Please feel free to contact us with any questions at [phone] (please enter 7-digits).   I have personally obtained a history, examined the patient, evaluated laboratory and imaging results, formulated the assessment and plan and placed orders. Critical Care Time devoted to patient care services described in this note is 35 minutes.    Vilinda Boehringer, MD [facility] Pager [phone] (please enter 7-digits) On Call Pager - [phone] (please enter 7-digits)

## 2016-01-08 NOTE — Progress Notes (Signed)
Pharmacy Antibiotic Note  Pamela Preston is a 82 y.o. female admitted on 01/05/2016 with sepsis.  Pharmacy has been consulted for Zosyn. ID MD is following patient as well.   Plan: Continue Zosyn 3.375g IV q12h (4 hour infusion).  Height: 5\' 8"  (172.7 cm) Weight: 246 lb 0.5 oz (111.6 kg) IBW/kg (Calculated) : 63.9  Temp (24hrs), Avg:97.8 F (36.6 C), Min:97.6 F (36.4 C), Max:97.9 F (36.6 C)   Recent Labs Lab 01/05/16 1330 01/05/16 1350 01/05/16 1708 01/06/16 0527 01/07/16 0454  WBC 25.0*  --   --  25.3* 17.2*  CREATININE 5.22*  --   --  4.01* 3.68*  LATICACIDVEN  --  4.8* 3.4*  --   --     Estimated Creatinine Clearance: 14.9 mL/min (by C-G formula based on Cr of 3.68).    Allergies  Allergen Reactions  . Colchicine Other (See Comments)    Unknown reaction  . Ketorolac Other (See Comments)    Unknown reaction  . Lactase Diarrhea  . Lipitor [Atorvastatin] Other (See Comments)    Unknown reaction    Antimicrobials this admission: Vancomycin  5/20 >> 5/21 Zosyn 5/20 >>   Dose adjustments this admission:   Microbiology results: BCx: Strep species, Klebsiella pneumoniae,  UCx: 100 k CFU proteus  5/20 MRSA PCR: negative   Thank you for allowing pharmacy to be a part of this patient's care.  Pamela Preston D 01/08/2016 8:28 AM

## 2016-01-09 ENCOUNTER — Ambulatory Visit: Payer: Medicare Other

## 2016-01-09 LAB — GLUCOSE, CAPILLARY
GLUCOSE-CAPILLARY: 139 mg/dL — AB (ref 65–99)
GLUCOSE-CAPILLARY: 210 mg/dL — AB (ref 65–99)
Glucose-Capillary: 90 mg/dL (ref 65–99)
Glucose-Capillary: 194 mg/dL — ABNORMAL HIGH (ref 65–99)
Glucose-Capillary: 157 mg/dL — ABNORMAL HIGH (ref 65–99)

## 2016-01-09 LAB — BASIC METABOLIC PANEL
Anion gap: 4 — ABNORMAL LOW (ref 5–15)
BUN: 49 mg/dL — AB (ref 6–20)
CALCIUM: 8 mg/dL — AB (ref 8.9–10.3)
CHLORIDE: 120 mmol/L — AB (ref 101–111)
CO2: 20 mmol/L — AB (ref 22–32)
CREATININE: 2.43 mg/dL — AB (ref 0.44–1.00)
GFR calc non Af Amer: 17 mL/min — ABNORMAL LOW (ref >60)
GFR, EST AFRICAN AMERICAN: 20 mL/min — AB (ref >60)
Glucose, Bld: 98 mg/dL (ref 65–99)
Potassium: 4.6 mmol/L (ref 3.5–5.1)
SODIUM: 144 mmol/L (ref 135–145)

## 2016-01-09 LAB — CULTURE, BLOOD (ROUTINE X 2)

## 2016-01-09 MED ORDER — DEXTROSE 5 % IV SOLN
2.0000 g | INTRAVENOUS | Status: DC
  Administered 2016-01-09 – 2016-01-10 (×2): 2 g via INTRAVENOUS
  Filled 2016-01-09 (×3): qty 2

## 2016-01-09 MED ORDER — PIPERACILLIN-TAZOBACTAM 3.375 G IVPB
3.3750 g | Freq: Three times a day (TID) | INTRAVENOUS | Status: DC
  Filled 2016-01-09 (×2): qty 50

## 2016-01-09 MED ORDER — LEVOTHYROXINE SODIUM 75 MCG PO TABS
75.0000 ug | ORAL_TABLET | Freq: Every day | ORAL | Status: DC
  Administered 2016-01-10 – 2016-01-11 (×2): 75 ug via ORAL
  Filled 2016-01-09 (×2): qty 1

## 2016-01-09 NOTE — Progress Notes (Signed)
[place] [facility] [place] NAME: Pamela Preston    MR#:  [id number]  DATE OF BIRTH:  1930/05/05  SUBJECTIVE:   Transferred out of CCU yesterday. Hemodynamically stable. Afebrile. Family at bedside. Patient still complaining of nonspecific pain on her sides bilaterally.  REVIEW OF SYSTEMS:    Review of Systems  Constitutional: Negative for fever and chills.  HENT: Negative for congestion and tinnitus.   Eyes: Negative for blurred vision and double vision.  Respiratory: Negative for cough, shortness of breath and wheezing.   Cardiovascular: Negative for chest pain, orthopnea and PND.  Gastrointestinal: Negative for nausea, vomiting, abdominal pain and diarrhea.  Genitourinary: Negative for dysuria and hematuria.  Neurological: Negative for dizziness, sensory change and focal weakness.  All other systems reviewed and are negative.   Nutrition: Regular Tolerating Diet: Yes Tolerating PT: Await Eval.   DRUG ALLERGIES:   Allergies  Allergen Reactions  . Colchicine Other (See Comments)    Unknown reaction  . Ketorolac Other (See Comments)    Unknown reaction  . Lactase Diarrhea  . Lipitor [Atorvastatin] Other (See Comments)    Unknown reaction    VITALS:  Blood pressure 120/41, pulse 91, temperature 98.2 F (36.8 C), temperature source Oral, resp. rate 16, height 5\' 8"  (1.727 m), weight 115.622 kg (254 lb 14.4 oz), SpO2 100 %.  PHYSICAL EXAMINATION:   Physical Exam  GENERAL:  82 y.o.-year-old obese patient lying in the bed in no acute distress.  EYES: Pupils equal, round, reactive to light and accommodation. No scleral icterus. Extraocular muscles intact.  HEENT: Head atraumatic, normocephalic. Oropharynx and nasopharynx clear.  NECK:  Supple, no jugular venous distention. No thyroid enlargement, no tenderness.  LUNGS: Normal breath sounds bilaterally, no wheezing, rales, rhonchi. No use of accessory muscles of respiration.   CARDIOVASCULAR: S1, S2 normal. No murmurs, rubs, or gallops.  ABDOMEN: Soft, NT, nondistended. Bowel sounds present. No organomegaly or mass.  EXTREMITIES: No cyanosis, clubbing or edema b/l.    NEUROLOGIC: Cranial nerves II through XII are intact. No focal Motor or sensory deficits b/l.  Globally weak PSYCHIATRIC: The patient is alert and oriented x 3.  SKIN: No obvious rash, lesion, or ulcer.    LABORATORY PANEL:   CBC  Recent Labs Lab 01/08/16 1257  WBC 9.0  HGB 8.0*  HCT 24.7*  PLT 97*   ------------------------------------------------------------------------------------------------------------------  Chemistries   Recent Labs Lab 01/06/16 0527  01/09/16 0505  NA 140  < > 144  K 4.5  < > 4.6  CL 113*  < > 120*  CO2 18*  < > 20*  GLUCOSE 245*  < > 98  BUN 58*  < > 49*  CREATININE 4.01*  < > 2.43*  CALCIUM 7.6*  < > 8.0*  AST 29  --   --   ALT 22  --   --   ALKPHOS 154*  --   --   BILITOT 0.8  --   --   < > = values in this interval not displayed. ------------------------------------------------------------------------------------------------------------------  Cardiac Enzymes  Recent Labs Lab 01/06/16 0527  TROPONINI 0.09*   ------------------------------------------------------------------------------------------------------------------  RADIOLOGY:  Dg Abd 1 View  01/07/2016  CLINICAL DATA:  Abdominal pain, stage IV pancreatic cancer, currently on chemotherapy and radiation therapy, history ovarian cancer, chronic kidney disease, hypertension, diabetes mellitus, altered level of consciousness, fall, hypoglycemia EXAM: ABDOMEN - 1 VIEW COMPARISON:  Portable exam 1557 hours compared to CT abdomen and pelvis 01/05/2016 FINDINGS: Stool  and gas in rectum. CBD wall stent in RIGHT upper quadrant with associated pneumobilia. Nonobstructive bowel gas pattern. Mild gaseous distention of stomach. No bowel wall thickening or ventral abdominal mass effect. Multilevel  degenerative disc disease changes of the thoracolumbar spine. Degenerative changes of both hip joints greater on RIGHT. IMPRESSION: Nonobstructive bowel gas pattern. Electronically Signed   By: Lavonia Dana M.D.   On: 01/07/2016 16:25   Dg Pelvis Portable  01/07/2016  CLINICAL DATA:  Abdominal pain EXAM: PORTABLE PELVIS 1-2 VIEWS COMPARISON:  Portable exam 1557 hours compared to 2 CT abdomen and pelvis 01/05/2016 FINDINGS: Degenerative changes of both hip joints greater on RIGHT, which demonstrates a greater degree of joint space narrowing. No acute fracture, dislocation or bone destruction. Pelvis grossly intact. Degenerative disc and facet disease changes at visualized lower lumbar spine IMPRESSION: Degenerative changes of the hip joints and visualized lower lumbar spine. No acute abnormalities. Electronically Signed   By: Lavonia Dana M.D.   On: 01/07/2016 16:26     ASSESSMENT AND PLAN:   82 year old female with past medical history of pancreatic cancer, chronic kidney disease stage III, diabetes, history of breast, uterine cancer, hyperlipidemia, GERD, recent admission for Escherichia coli sepsis status post biliary stent placement who presents to the hospital due to altered mental status.  1. Sepsis-source unclear. Patient had a recent admission secondary to Escherichia coli bacteremia and cholangitis. -LFTs are currently stable and her CT scan of abdomen pelvis on admission did not show any acute abnormality. -Patient's urine cultures growing Proteus and blood cultures growing Streptococcus, Klebsiella.  -We will narrow down antibiotics from IV Zosyn to ceftriaxone as all are sensitive to it. -Improved, off vasopressors. Appreciate infectious disease input and Repeat blood cultures have been drawn from Port-A-Cath and peripherally and so far they're negative.  2. Altered mental status-metabolic encephalopathy due to sepsis/hypoglycemia. -Much improved mental status back to baseline nowl. -  cont. Supportive care to treat underlying sepsis, BS stable.   3. Abdominal/Hip pain - etiology unclear.  Non-specific.  -Improved, x-ray of the abdomen, pelvis was negative yesterday.  4. Hypoglycemia-secondary to sepsis. Resolved - follow BS  5. Leukocytosis-secondary to sepsis. -Now normalized  6. Pancreatic cancer stage IV-status post recent biliary stent placement during previous hospitalization. LFTs currently stable. No acute abdominal pain.  - appreciate Palliative care consult and pt. Has reversed her code status to FULL CODE.    7. Acute on chronic renal failure-this is secondary to ATN and sepsis. Baseline creatinine is around 2.2-3  - Improved with IV fluids and close to baseline.  Cont. PT.  D/c Foley, d/c tele. Likely d/c to rehab soon.    All the records are reviewed and case discussed with Care Management/Social Workerr. Management plans discussed with the patient, family and they are in agreement.  CODE STATUS: DO NOT RESUSCITATE  DVT Prophylaxis: Ted's & SCD's.   TOTAL Critical Care TIME TAKING CARE OF THIS PATIENT: 30 minutes.   POSSIBLE D/C IN 2-3 DAYS, DEPENDING ON CLINICAL CONDITION.   Henreitta Leber M.D on 01/09/2016 at 2:36 PM  Between 7am to 6pm - Pager - [phone]  After 6pm go to [email or web address] - password [email or web address] [facility]  Office  [phone]  CC: Primary care physician; Raylene Miyamoto., MD

## 2016-01-09 NOTE — Progress Notes (Signed)
[facility] INFECTIOUS DISEASE PROGRESS NOTE Date of Admission:  01/05/2016     ID: Pamela Preston is a 82 y.o. female with  Mixed bacteremia, panc cancer, portacath in place  Active Problems:   Septic shock ([facility])   Acute on chronic renal failure (HCC)   Lactic acidosis   Arterial hypotension   Poor appetite   Dehydration   Abdominal pain   Subjective: No fevers, wbc decreasing  ROS  Eleven systems are reviewed and negative except per hpi  Medications:  Antibiotics Given (last 72 hours)    Date/Time Action Medication Dose Rate   01/06/16 2140 Given   piperacillin-tazobactam (ZOSYN) IVPB 3.375 g 3.375 g 12.5 mL/hr   01/07/16 1037 Given   piperacillin-tazobactam (ZOSYN) IVPB 3.375 g 3.375 g 12.5 mL/hr   01/07/16 2220 Given   piperacillin-tazobactam (ZOSYN) IVPB 3.375 g 3.375 g 12.5 mL/hr   01/08/16 0930 Given   piperacillin-tazobactam (ZOSYN) IVPB 3.375 g 3.375 g 12.5 mL/hr   01/08/16 2209 Given   piperacillin-tazobactam (ZOSYN) IVPB 3.375 g 3.375 g 12.5 mL/hr   01/09/16 0952 Given   piperacillin-tazobactam (ZOSYN) IVPB 3.375 g 3.375 g 12.5 mL/hr     . cefTRIAXone (ROCEPHIN)  IV  2 g Intravenous Q24H  . docusate sodium  100 mg Oral BID  . feeding supplement (ENSURE ENLIVE)  237 mL Oral TID WC  . heparin  5,000 Units Subcutaneous Q8H  . insulin aspart  0-9 Units Subcutaneous TID AC & HS  . [START ON 01/10/2016] levothyroxine  75 mcg Oral QAC breakfast  . sodium chloride flush  3 mL Intravenous Q12H    Objective: Vital signs in last 24 hours: Temp:  [97.4 F (36.3 C)-98.2 F (36.8 C)] 98.2 F (36.8 C) (05/24 0453) Pulse Rate:  [91-96] 91 (05/24 0453) Resp:  [16-20] 16 (05/24 0453) BP: (119-120)/(41-43) 120/41 mmHg (05/24 0453) SpO2:  [100 %] 100 % (05/24 0453) Weight:  [115.622 kg (254 lb 14.4 oz)] 115.622 kg (254 lb 14.4 oz) (05/24 0453) Constitutional: oriented to person, place, and time. Chronically ill appearing HENT: [place]/AT, PERRLA, no scleral  icterus Mouth/Throat: Oropharynx is clear and dry . No oropharyngeal exudate. Portacath R chest wall wnl  Cardiovascular: Normal rate, regular rhythm and normal heart sounds.  Pulmonary/Chest: Effort normal and breath sounds normal.  Neck supple, no nuchal rigidity Abdominal: Soft. Mild ttp over R flank Lymphadenopathy: no cervical adenopathy. No axillary adenopathy Neurological: alert and oriented to person, place, and time.  Skin: Skin is warm and dry. No rash noted. No erythema.  Psychiatric: a normal mood and affect. behavior is normal.  Foley in place  Lab Results  Recent Labs  01/07/16 0454 01/08/16 1257 01/09/16 0505  WBC 17.2* 9.0  --   HGB 8.2* 8.0*  --   HCT 25.5* 24.7*  --   NA 140 144 144  K 4.5 4.5 4.6  CL 114* 118* 120*  CO2 17* 20* 20*  BUN 61* 56* 49*  CREATININE 3.68* 2.97* 2.43*    Microbiology: Results for orders placed or performed during the hospital encounter of 01/05/16  Blood culture (routine x 2)     Status: Abnormal   Collection Time: 01/05/16  1:50 PM  Result Value Ref Range Status   Specimen Description BLOOD RIGHT ASSIST CONTROL  Final   Special Requests BOTTLES DRAWN AEROBIC AND ANAEROBIC 1 CC  Final   Culture  Setup Time   Final    GRAM POSITIVE COCCI IN BOTH AEROBIC AND ANAEROBIC BOTTLES CRITICAL  VALUE NOTED.  VALUE IS CONSISTENT WITH PREVIOUSLY REPORTED AND CALLED VALUE. CONFIRMED BY [facility]    Culture (A)  Final    STREPTOCOCCUS SPECIES IN BOTH AEROBIC AND ANAEROBIC BOTTLES    Report Status 01/09/2016 FINAL  Final   Organism ID, Bacteria STREPTOCOCCUS SPECIES  Final      Susceptibility   Streptococcus species - MIC*    PENICILLIN Value in next row Sensitive      SENSITIVE<=0.12    ERYTHROMYCIN Value in next row Sensitive      SENSITIVE<=0.25    CEFTRIAXONE Value in next row Sensitive      SENSITIVE<=1.0    PENICILLIN Value in next row Sensitive      SENSITIVE<=0.12    CEFTRIAXONE Value in next row Sensitive       SENSITIVE<=1.0    * STREPTOCOCCUS SPECIES  Blood culture (routine x 2)     Status: Abnormal   Collection Time: 01/05/16  1:55 PM  Result Value Ref Range Status   Specimen Description BLOOD LEFT ASSIST CONTROL  Final   Special Requests BOTTLES DRAWN AEROBIC AND ANAEROBIC 7 CC  Final   Culture  Setup Time   Final    GRAM POSITIVE COCCI ANAEROBIC BOTTLE ONLY CRITICAL RESULT CALLED TO, READ BACK BY AND VERIFIED WITH: MATT MCBANE AT 0530 ON 01/06/16 [facility] CONFIRMED BY [facility] GRAM NEGATIVE RODS AEROBIC BOTTLE ONLY CRITICAL RESULT CALLED TO, READ BACK BY AND VERIFIED WITH: SCOTT CHRISTY AT [place] ON 01/06/16.[place]    Culture (A)  Final    STREPTOCOCCUS SPECIES ANAEROBIC BOTTLE ONLY KLEBSIELLA PNEUMONIAE AEROBIC BOTTLE ONLY REFER TO OTHER BLOOD CULTURE SET FOR STREPTOCOCCUS SPECIES SUSCEPTIBILITIES.    Report Status 01/09/2016 FINAL  Final   Organism ID, Bacteria KLEBSIELLA PNEUMONIAE  Final      Susceptibility   Klebsiella pneumoniae - MIC*    AMPICILLIN Value in next row Resistant      RESISTANT16    CEFAZOLIN Value in next row Sensitive      SENSITIVE<=4    CEFEPIME Value in next row Sensitive      SENSITIVE<=1    CEFTAZIDIME Value in next row Sensitive      SENSITIVE<=1    CEFTRIAXONE Value in next row Sensitive      SENSITIVE<=1    CIPROFLOXACIN Value in next row Sensitive      SENSITIVE1    GENTAMICIN Value in next row Sensitive      SENSITIVE<=1    IMIPENEM Value in next row Sensitive      SENSITIVE<=0.25    TRIMETH/SULFA Value in next row Sensitive      SENSITIVE<=20    AMPICILLIN/SULBACTAM Value in next row Sensitive      SENSITIVE4    PIP/TAZO Value in next row Sensitive      SENSITIVE<=4    Extended ESBL Value in next row Sensitive      SENSITIVE<=4    * KLEBSIELLA PNEUMONIAE  Blood Culture ID Panel (Reflexed)     Status: Abnormal   Collection Time: 01/05/16  1:55 PM  Result Value Ref Range Status   Enterococcus species NOT DETECTED NOT DETECTED Final   Vancomycin  resistance NOT DETECTED NOT DETECTED Final   Listeria monocytogenes NOT DETECTED NOT DETECTED Final   Staphylococcus species NOT DETECTED NOT DETECTED Final   Staphylococcus aureus NOT DETECTED NOT DETECTED Final   Methicillin resistance NOT DETECTED NOT DETECTED Final   Streptococcus species DETECTED (A) NOT DETECTED Final    Comment: CRITICAL RESULT CALLED TO, READ  BACK BY AND VERIFIED WITH: CALLED MATT MCBANE AT 0530 ON 01/06/16 RWW    Streptococcus agalactiae NOT DETECTED NOT DETECTED Final   Streptococcus pneumoniae NOT DETECTED NOT DETECTED Final   Streptococcus pyogenes NOT DETECTED NOT DETECTED Final   Acinetobacter baumannii NOT DETECTED NOT DETECTED Final   Enterobacteriaceae species NOT DETECTED NOT DETECTED Final   Enterobacter cloacae complex NOT DETECTED NOT DETECTED Final   Escherichia coli NOT DETECTED NOT DETECTED Final   Klebsiella oxytoca NOT DETECTED NOT DETECTED Final   Klebsiella pneumoniae NOT DETECTED NOT DETECTED Final   Proteus species NOT DETECTED NOT DETECTED Final   Serratia marcescens NOT DETECTED NOT DETECTED Final   Carbapenem resistance NOT DETECTED NOT DETECTED Final   Haemophilus influenzae NOT DETECTED NOT DETECTED Final   Neisseria meningitidis NOT DETECTED NOT DETECTED Final   Pseudomonas aeruginosa NOT DETECTED NOT DETECTED Final   Candida albicans NOT DETECTED NOT DETECTED Final   Candida glabrata NOT DETECTED NOT DETECTED Final   Candida krusei NOT DETECTED NOT DETECTED Final   Candida parapsilosis NOT DETECTED NOT DETECTED Final   Candida tropicalis NOT DETECTED NOT DETECTED Final  Urine culture     Status: Abnormal   Collection Time: 01/05/16  5:48 PM  Result Value Ref Range Status   Specimen Description URINE, CATHETERIZED  Final   Special Requests Normal  Final   Culture >=100,000 COLONIES/mL PROTEUS MIRABILIS (A)  Final   Report Status 01/08/2016 FINAL  Final   Organism ID, Bacteria PROTEUS MIRABILIS (A)  Final      Susceptibility    Proteus mirabilis - MIC*    AMPICILLIN <=2 SENSITIVE Sensitive     CEFAZOLIN 8 SENSITIVE Sensitive     CEFTRIAXONE <=1 SENSITIVE Sensitive     CIPROFLOXACIN >=4 RESISTANT Resistant     GENTAMICIN <=1 SENSITIVE Sensitive     IMIPENEM 4 SENSITIVE Sensitive     NITROFURANTOIN 128 RESISTANT Resistant     TRIMETH/SULFA <=20 SENSITIVE Sensitive     AMPICILLIN/SULBACTAM <=2 SENSITIVE Sensitive     PIP/TAZO <=4 SENSITIVE Sensitive     * >=100,000 COLONIES/mL PROTEUS MIRABILIS  MRSA PCR Screening     Status: None   Collection Time: 01/05/16  5:49 PM  Result Value Ref Range Status   MRSA by PCR NEGATIVE NEGATIVE Final    Comment:        The GeneXpert MRSA Assay (FDA approved for NASAL specimens only), is one component of a comprehensive MRSA colonization surveillance program. It is not intended to diagnose MRSA infection nor to guide or monitor treatment for MRSA infections.   Blood Culture ID Panel (Reflexed)     Status: Abnormal   Collection Time: 01/06/16  8:00 AM  Result Value Ref Range Status   Enterococcus species NOT DETECTED NOT DETECTED Final   Vancomycin resistance NOT DETECTED NOT DETECTED Final   Listeria monocytogenes NOT DETECTED NOT DETECTED Final   Staphylococcus species NOT DETECTED NOT DETECTED Final   Staphylococcus aureus NOT DETECTED NOT DETECTED Final   Methicillin resistance NOT DETECTED NOT DETECTED Final   Streptococcus species DETECTED (A) NOT DETECTED Final    Comment: CRITICAL RESULT CALLED TO, READ BACK BY AND VERIFIED WITH: SCOTT CHRISTY 01/06/16 0825 [place]    Streptococcus agalactiae NOT DETECTED NOT DETECTED Final   Streptococcus pneumoniae NOT DETECTED NOT DETECTED Final   Streptococcus pyogenes NOT DETECTED NOT DETECTED Final   Acinetobacter baumannii NOT DETECTED NOT DETECTED Final   Enterobacteriaceae species DETECTED (A) NOT DETECTED Final  Comment: CRITICAL RESULT CALLED TO, READ BACK BY AND VERIFIED WITH: SCOTT CHRISTY 01/06/16 0825 [place]     Enterobacter cloacae complex NOT DETECTED NOT DETECTED Final   Escherichia coli NOT DETECTED NOT DETECTED Final   Klebsiella oxytoca NOT DETECTED NOT DETECTED Final   Klebsiella pneumoniae DETECTED (A) NOT DETECTED Final    Comment: CRITICAL RESULT CALLED TO, READ BACK BY AND VERIFIED WITH: SCOTT CHRISTY 01/06/16 0825 [place]    Proteus species NOT DETECTED NOT DETECTED Final   Serratia marcescens NOT DETECTED NOT DETECTED Final   Carbapenem resistance NOT DETECTED NOT DETECTED Final   Haemophilus influenzae NOT DETECTED NOT DETECTED Final   Neisseria meningitidis NOT DETECTED NOT DETECTED Final   Pseudomonas aeruginosa NOT DETECTED NOT DETECTED Final   Candida albicans NOT DETECTED NOT DETECTED Final   Candida glabrata NOT DETECTED NOT DETECTED Final   Candida krusei NOT DETECTED NOT DETECTED Final   Candida parapsilosis NOT DETECTED NOT DETECTED Final   Candida tropicalis NOT DETECTED NOT DETECTED Final  Culture, blood (single) w Reflex to ID Panel     Status: None (Preliminary result)   Collection Time: 01/07/16  5:05 PM  Result Value Ref Range Status   Specimen Description BLOOD A-LINE DRAW  Final   Special Requests BOTTLES DRAWN AEROBIC AND ANAEROBIC  5CC  Final   Culture NO GROWTH 2 DAYS  Final   Report Status PENDING  Incomplete  Culture, blood (single) w Reflex to ID Panel     Status: None (Preliminary result)   Collection Time: 01/07/16  5:37 PM  Result Value Ref Range Status   Specimen Description BLOOD RIGHT ASSIST CONTROL  Final   Special Requests   Final    BOTTLES DRAWN AEROBIC AND ANAEROBIC  AERO12CC ANA5CC   Culture NO GROWTH 2 DAYS  Final   Report Status PENDING  Incomplete    Studies/Results: Dg Abd 1 View  01/07/2016  CLINICAL DATA:  Abdominal pain, stage IV pancreatic cancer, currently on chemotherapy and radiation therapy, history ovarian cancer, chronic kidney disease, hypertension, diabetes mellitus, altered level of consciousness, fall, hypoglycemia EXAM:  ABDOMEN - 1 VIEW COMPARISON:  Portable exam 1557 hours compared to CT abdomen and pelvis 01/05/2016 FINDINGS: Stool and gas in rectum. CBD wall stent in RIGHT upper quadrant with associated pneumobilia. Nonobstructive bowel gas pattern. Mild gaseous distention of stomach. No bowel wall thickening or ventral abdominal mass effect. Multilevel degenerative disc disease changes of the thoracolumbar spine. Degenerative changes of both hip joints greater on RIGHT. IMPRESSION: Nonobstructive bowel gas pattern. Electronically Signed   By: Lavonia Dana M.D.   On: 01/07/2016 16:25   Dg Pelvis Portable  01/07/2016  CLINICAL DATA:  Abdominal pain EXAM: PORTABLE PELVIS 1-2 VIEWS COMPARISON:  Portable exam 1557 hours compared to 2 CT abdomen and pelvis 01/05/2016 FINDINGS: Degenerative changes of both hip joints greater on RIGHT, which demonstrates a greater degree of joint space narrowing. No acute fracture, dislocation or bone destruction. Pelvis grossly intact. Degenerative disc and facet disease changes at visualized lower lumbar spine IMPRESSION: Degenerative changes of the hip joints and visualized lower lumbar spine. No acute abnormalities. Electronically Signed   By: Lavonia Dana M.D.   On: 01/07/2016 16:26    Assessment/Plan: Pamela Preston is a 82 y.o. female stage IV pancreatic cancer currently on chemo and radiation, ovarian cancer, CKD stage IV, hypertension and diabetes who presented to the ED with altered level of consciousness, fall and hypoglycemia with blood glucose in the 40s. She has  since been found to have bacteremia with strep species and Klebsiella and ucx with proteus. She has a portacath in place. Also had a biliary stent with CT this admission showing no evidence obstruction or biliary infection. On admission wbc was 25, down now to 17. Has been on vanco and zosyn.  Fu cx 5/22 Pending one from port and one from PV Unclear if bacteremia is from the portacath or from GI/biliary source.  Clinically improving. Hesitant to remove port without definitive need. I do think likley GI source May 24- no fevers, wbc down to 9. FU BCX negative  Recommendations Can change to IV unasyn while inpatient and at DC change to oral augmentin 875 bid Repeat bcx x 2 (one from port and one from peripheral) are negative to date Would rec a 14 day course from the negative blood cultures - if remain negative from 5/22 then stop date would be June 5th   Following cessation of antibiotics I would suggest repeat blood cultures either at home or at the cancer center to ensure no recurrence. If the same pathogens were to recur this would suggest line infection and would have to remove port. Thank you very much for the consult. Will follow with you.  FITZGERALD, DAVID P   01/09/2016, 2:54 PM

## 2016-01-09 NOTE — NC FL2 (Signed)
[place] LEVEL OF CARE SCREENING TOOL     IDENTIFICATION  Patient Name: Pamela Preston Birthdate: [birth year] Sex: female Admission Date (Current Location): 01/05/2016  [place] and [place] Number:  Engineering geologist and Address:  [facility], [address] [place], [place] [address]      Provider Number: [id number]  Attending Physician Name and Address:  Henreitta Leber, MD  Relative Name and Phone Number:       Current Level of Care: Hospital Recommended Level of Care: [place] Prior Approval Number:    Date Approved/Denied:   PASRR Number: existing  Discharge Plan: SNF    Current Diagnoses: Patient Active Problem List   Diagnosis Date Noted  . Acute on chronic renal failure ([place])   . Lactic acidosis   . Arterial hypotension   . Poor appetite   . Dehydration   . Abdominal pain   . Septic shock ([place]) 01/05/2016  . Pressure ulcer 11/21/2015  . Sepsis ([place]) 11/20/2015  . Jaundice 11/20/2015  . Allergic rhinitis 10/16/2015  . Controlled type 2 diabetes mellitus without complication ([place]) [id number]  . Acid reflux 10/16/2015  . Gout 10/16/2015  . H/O malignant neoplasm of breast 10/16/2015  . HLD (hyperlipidemia) 10/16/2015  . BP (high blood pressure) 10/16/2015  . Adult hypothyroidism 10/16/2015  . Anemia, iron deficiency 10/16/2015  . Morbid obesity ([place]) 10/16/2015  . Adiposity 10/16/2015  . Arthritis, degenerative 10/16/2015  . Primary malignant neoplasm of head of pancreas ([place]) 10/11/2015  . Protein-calorie malnutrition, severe 09/02/2015  . Severe protein-calorie malnutrition Altamease Oiler: less than 60% of standard weight) ([place]) 09/02/2015  . Obstructive jaundice 08/31/2015  . Biliary and gallbladder disorder 08/31/2015  . Abnormal loss of weight 07/26/2015  . D (diarrhea) 07/26/2015  . Abnormal weight loss 07/26/2015  . Type 2 diabetes mellitus ([place]) 03/11/2015  . Right hip pain 01/24/2013  . HTN  (hypertension) 01/24/2013  . CKD (chronic kidney disease) stage 5, GFR less than 15 ml/min (HCC) 01/24/2013  . Diabetes mellitus type 2 in obese ([place]) 01/24/2013  . Chronic kidney disease, stage IV (severe) ([place]) 01/24/2013  . Essential (primary) hypertension 01/24/2013  . Arthralgia of hip or thigh 01/24/2013    Orientation RESPIRATION BLADDER Height & Weight     Self, Situation, Place  Normal Continent Weight: 254 lb 14.4 oz (115.622 kg) Height:  5\' 8"  (172.7 cm)  BEHAVIORAL SYMPTOMS/MOOD NEUROLOGICAL BOWEL NUTRITION STATUS   (none)  (none) Incontinent Diet (regular)  AMBULATORY STATUS COMMUNICATION OF NEEDS Skin   Extensive Assist Verbally Normal                       Personal Care Assistance Level of Assistance  Bathing, Dressing Bathing Assistance: Maximum assistance   Dressing Assistance: Maximum assistance     Functional Limitations Info             SPECIAL CARE FACTORS FREQUENCY  PT (By licensed PT)                    Contractures Contractures Info: Not present    Additional Factors Info  Code Status Code Status Info: FULL             Current Medications (01/09/2016):  This is the current hospital active medication list Current Facility-Administered Medications  Medication Dose Route Frequency Provider Last Rate Last Dose  . 0.9 %  sodium chloride infusion   Intravenous Continuous Hillary Bow, MD 50  mL/hr at 01/09/16 0952    . acetaminophen (TYLENOL) tablet 650 mg  650 mg Oral Q6H PRN Hillary Bow, MD       Or  . acetaminophen (TYLENOL) suppository 650 mg  650 mg Rectal Q6H PRN Srikar Sudini, MD      . albuterol (PROVENTIL) (2.5 MG/3ML) 0.083% nebulizer solution 2.5 mg  2.5 mg Nebulization Q2H PRN Srikar Sudini, MD      . docusate sodium (COLACE) capsule 100 mg  100 mg Oral BID Vishal Mungal, MD   100 mg at 01/09/16 0951  . feeding supplement (ENSURE ENLIVE) (ENSURE ENLIVE) liquid 237 mL  237 mL Oral TID WC Henreitta Leber, MD   237 mL at  01/09/16 0800  . heparin injection 5,000 Units  5,000 Units Subcutaneous Q8H Hillary Bow, MD   5,000 Units at 01/09/16 0724  . insulin aspart (novoLOG) injection 0-9 Units  0-9 Units Subcutaneous TID AC & HS Lance Coon, MD   1 Units at 01/08/16 2148  . norepinephrine (LEVOPHED) 4mg  in D5W 221mL premix infusion  0-40 mcg/min Intravenous Titrated Harrie Foreman, MD   Stopped at 01/07/16 1814  . ondansetron (ZOFRAN) tablet 4 mg  4 mg Oral Q6H PRN Hillary Bow, MD       Or  . ondansetron (ZOFRAN) injection 4 mg  4 mg Intravenous Q6H PRN Srikar Sudini, MD      . oxyCODONE (Oxy IR/ROXICODONE) immediate release tablet 10 mg  10 mg Oral Q3H PRN Colleen Can, MD      . oxyCODONE (Oxy IR/ROXICODONE) immediate release tablet 5 mg  5 mg Oral Q3H PRN Colleen Can, MD   5 mg at 01/09/16 0725  . piperacillin-tazobactam (ZOSYN) IVPB 3.375 g  3.375 g Intravenous Q8H Crystal G Scarpena, RPH      . polyethylene glycol (MIRALAX / GLYCOLAX) packet 17 g  17 g Oral Daily PRN Srikar Sudini, MD      . sodium chloride flush (NS) 0.9 % injection 3 mL  3 mL Intravenous Q12H Srikar Sudini, MD   3 mL at 01/08/16 2149   Facility-Administered Medications Ordered in Other Encounters  Medication Dose Route Frequency Provider Last Rate Last Dose  . 0.9 %  sodium chloride infusion   Intravenous Continuous Forest Gleason, MD   Stopped at 11/08/15 1144  . heparin lock flush 100 unit/mL  500 Units Intracatheter Once PRN Forest Gleason, MD      . heparin lock flush 100 unit/mL  500 Units Intravenous Once Forest Gleason, MD      . sodium chloride flush (NS) 0.9 % injection 10 mL  10 mL Intracatheter PRN Forest Gleason, MD   10 mL at 11/08/15 1052  . sodium chloride flush (NS) 0.9 % injection 10 mL  10 mL Intravenous PRN Forest Gleason, MD         Discharge Medications: Please see discharge summary for a list of discharge medications.  Relevant Imaging Results:  Relevant Lab Results:   Additional Information SS:  [id number]  Shela Leff, LCSW

## 2016-01-09 NOTE — Progress Notes (Signed)
Report passed on to [employer], [place]. Pt resting in bed. Continue to assess.

## 2016-01-09 NOTE — Care Management (Addendum)
Blood cultures and on IV antibiotics.  ID following. CSW has met with patient's sister regarding plan of care and disposition.  Would not want patient to return to [facility].  Will attempt to obtain a bed at [facility] for continued rehab.  If unable to secure a skilled bed at desired facility,  Joaquim Lai would like to take patient back to her residence and provide care.  Provided Joaquim Lai with list of home health agencies, DME agencies, (at present know would think there would be  need  for hospital bed, maybe a hoyer lift, wheelchair bsc) and in home continuous care provider agencies.   It is documented patient has been more alert and that current code status is appropriate.

## 2016-01-09 NOTE — Clinical Social Work Note (Signed)
Patient has received bed offer from [facility]. Shela Leff MSW,LCSW [phone]

## 2016-01-09 NOTE — Clinical Social Work Note (Signed)
Clinical Social Work Assessment  Patient Details  Name: Pamela Preston MRN: [id number] Date of Birth: [birth year]  Date of referral:  01/09/16               Reason for consult:  Facility Placement                Permission sought to share information with:  Family Supports Permission granted to share information::  Yes, Verbal Permission Granted  Name::        Agency::     Relationship::     Contact Information:     Housing/Transportation Living arrangements for the past 2 months:  [place], [place] of Information:  Other (Comment Required) (Sister: Rolly Salter) Patient Interpreter Needed:  None Criminal Activity/Legal Involvement Pertinent to Current Situation/Hospitalization:  No - Comment as needed Significant Relationships:  Siblings Lives with:  Facility Resident Do you feel safe going back to the place where you live?    Need for family participation in patient care:  Yes (Comment)  Care giving concerns:  Patient has been at [facility] for STR.   Social Worker assessment / plan:  CSW noted that patient came from [facility] where she was receiving STR. Patient still groggy and in some pain this morning. Patient gave permission for CSW to speak with her sister regarding discharge plans. CSW contacted patient's sister, Rolly Salter, [phone] via phone and she was en route to the hospital. CSW met up with Ms. Edwards when she arrived to the hospital. Ms. Oletta Lamas explained that she does not wish for patient to return to [facility] because of the care she received there. Ms. Oletta Lamas went into more detail regarding this with CSW and CSW provided supportive listening. Patient's sister stated that if patient cannot get into [facility], that she would like to take her sister home. CSW discussed these options with her at length and has left a message for Maudie Mercury at [facility] and has informed Rn CM of the potential for discharging to  home.  Employment status:  Retired Nurse, adult PT Recommendations:  Not assessed at this time Information / Referral to community resources:     Patient/Family's Response to care:  Patient and sister expressed appreciation for CSW assistance.   Patient/Family's Understanding of and Emotional Response to Diagnosis, Current Treatment, and Prognosis:  Patient's sister is very optimistic about her sister's recovery.  Emotional Assessment Appearance:    Attitude/Demeanor/Rapport:   (pleasant but quiet) Affect (typically observed):  Calm Orientation:  Oriented to Self, Oriented to Place, Oriented to Situation Alcohol / Substance use:  Not Applicable Psych involvement (Current and /or in the community):  No (Comment)  Discharge Needs  Concerns to be addressed:  Care Coordination Readmission within the last 30 days:  No Current discharge risk:  None Barriers to Discharge:  No Barriers Identified   Shela Leff, LCSW 01/09/2016, 11:41 AM

## 2016-01-09 NOTE — Progress Notes (Signed)
Pharmacy Antibiotic Note  Pamela Preston is a 82 y.o. female admitted on 01/05/2016 with sepsis.  Pharmacy has been consulted for Zosyn. ID MD is following patient as well.   Plan: Patient's renal function is improved.  Est CrCl~23 mL/min.  Will transition patient to Zosyn 3.375g IV q12h (4 hour infusion).  Height: 5\' 8"  (172.7 cm) Weight: 254 lb 14.4 oz (115.622 kg) IBW/kg (Calculated) : 63.9  Temp (24hrs), Avg:97.7 F (36.5 C), Min:97.4 F (36.3 C), Max:98.2 F (36.8 C)   Recent Labs Lab 01/05/16 1330 01/05/16 1350 01/05/16 1708 01/06/16 0527 01/07/16 0454 01/08/16 1257 01/09/16 0505  WBC 25.0*  --   --  25.3* 17.2* 9.0  --   CREATININE 5.22*  --   --  4.01* 3.68* 2.97* 2.43*  LATICACIDVEN  --  4.8* 3.4*  --   --   --   --     Estimated Creatinine Clearance: 23 mL/min (by C-G formula based on Cr of 2.43).    Allergies  Allergen Reactions  . Colchicine Other (See Comments)    Unknown reaction  . Ketorolac Other (See Comments)    Unknown reaction  . Lactase Diarrhea  . Lipitor [Atorvastatin] Other (See Comments)    Unknown reaction    Antimicrobials this admission: Vancomycin  5/20 >> 5/21 Zosyn 5/20 >>    Microbiology results: BCx: Strep species, Klebsiella pneumoniae,  UCx: 100 k CFU proteus  5/20 MRSA PCR: negative   Thank you for allowing pharmacy to be a part of this patient's care.  Gavin Telford G 01/09/2016 10:42 AM

## 2016-01-09 NOTE — Progress Notes (Signed)
PT Cancellation Note  Patient Details Name: Pamela Preston MRN: [id number] DOB: [birth year]   Cancelled Treatment:    Reason Eval/Treat Not Completed: Patient declined, no reason specified. PT entered room, patient had been sleeping however awakened for PT. She reports she is not feeling well enough to attempt any bed mobility or transfers this date. Given her overall clinical picture, PT did not push for her to perform any mobility in this session. Patient agreed to make attempt tomorrow. PT will re-attempt as able.   Kerman Passey, PT, DPT    01/09/2016, 2:20 PM

## 2016-01-10 ENCOUNTER — Ambulatory Visit: Payer: Medicare Other

## 2016-01-10 ENCOUNTER — Encounter
Admission: RE | Admit: 2016-01-10 | Discharge: 2016-01-10 | Disposition: A | Discharge status: Home / Self Care | Payer: Medicare Other | Source: Ambulatory Visit | Attending: Internal Medicine | Admitting: Internal Medicine

## 2016-01-10 DIAGNOSIS — E119 Type 2 diabetes mellitus without complications: Secondary | ICD-10-CM | POA: Insufficient documentation

## 2016-01-10 LAB — BASIC METABOLIC PANEL
Anion gap: 5 (ref 5–15)
BUN: 37 mg/dL — AB (ref 6–20)
CHLORIDE: 118 mmol/L — AB (ref 101–111)
CO2: 20 mmol/L — ABNORMAL LOW (ref 22–32)
CREATININE: 2.05 mg/dL — AB (ref 0.44–1.00)
Calcium: 7.9 mg/dL — ABNORMAL LOW (ref 8.9–10.3)
GFR calc Af Amer: 24 mL/min — ABNORMAL LOW (ref >60)
GFR, EST NON AFRICAN AMERICAN: 21 mL/min — AB (ref >60)
Glucose, Bld: 118 mg/dL — ABNORMAL HIGH (ref 65–99)
Potassium: 4.3 mmol/L (ref 3.5–5.1)
SODIUM: 143 mmol/L (ref 135–145)

## 2016-01-10 LAB — GLUCOSE, CAPILLARY
GLUCOSE-CAPILLARY: 190 mg/dL — AB (ref 65–99)
Glucose-Capillary: 106 mg/dL — ABNORMAL HIGH (ref 65–99)
Glucose-Capillary: 229 mg/dL — ABNORMAL HIGH (ref 65–99)
Glucose-Capillary: 177 mg/dL — ABNORMAL HIGH (ref 65–99)

## 2016-01-10 LAB — CBC
HCT: 22.9 % — ABNORMAL LOW (ref 35.0–47.0)
Hemoglobin: 7.5 g/dL — ABNORMAL LOW (ref 12.0–16.0)
MCH: 30.6 pg (ref 26.0–34.0)
MCHC: 32.7 g/dL (ref 32.0–36.0)
MCV: 93.6 fL (ref 80.0–100.0)
PLATELETS: 122 10*3/uL — AB (ref 150–440)
RBC: 2.45 MIL/uL — ABNORMAL LOW (ref 3.80–5.20)
RDW: 18.4 % — AB (ref 11.5–14.5)
WBC: 6.4 10*3/uL (ref 3.6–11.0)

## 2016-01-10 NOTE — Clinical Social Work Note (Signed)
Patient will not discharge today to [facility] due to pain issues. CSW has spoken to both patient and her sister who is at patient's bedside this morning and they are aware. CSW has informed Maudie Mercury at [facility] as well. Shela Leff MSW,LCSW [phone]

## 2016-01-10 NOTE — Progress Notes (Signed)
Patient got up to recliner with 2+ assist. Tolerated well. Gave patient Tylenol for pain, reassessed pt, she was asleep in chair. Family at bedside continue to assess.

## 2016-01-10 NOTE — Care Management Important Message (Signed)
Important Message  Patient Details  Name: Pamela Preston MRN: [id number] Date of Birth: Aug 12, 1930   Medicare Important Message Given:  Yes    [name] A Garlene Apperson 01/10/2016, 10:59 AM

## 2016-01-10 NOTE — Progress Notes (Signed)
[place] [place] NAME: Pamela Preston    MR#:  [id number]  DATE OF BIRTH:  08/22/1929  SUBJECTIVE:   Complains of pain in the left side.  REVIEW OF SYSTEMS:    Review of Systems  Constitutional: Negative for fever and chills.  HENT: Negative for congestion and tinnitus.   Eyes: Negative for blurred vision and double vision.  Respiratory: Negative for cough, shortness of breath and wheezing.   Cardiovascular: Negative for chest pain, orthopnea and PND.  Gastrointestinal: Negative for nausea, vomiting, abdominal pain and diarrhea.  Genitourinary: Negative for dysuria and hematuria.  Neurological: Negative for dizziness, sensory change and focal weakness.  All other systems reviewed and are negative.   Nutrition: Regular Tolerating Diet: Yes Tolerating PT: Await Eval.   DRUG ALLERGIES:   Allergies  Allergen Reactions  . Colchicine Other (See Comments)    Unknown reaction  . Ketorolac Other (See Comments)    Unknown reaction  . Lactase Diarrhea  . Lipitor [Atorvastatin] Other (See Comments)    Unknown reaction    VITALS:  Blood pressure 138/56, pulse 100, temperature 98.1 F (36.7 C), temperature source Oral, resp. rate 18, height 5\' 8"  (1.727 m), weight 110.95 kg (244 lb 9.6 oz), SpO2 99 %.  PHYSICAL EXAMINATION:   Physical Exam  GENERAL:  82 y.o.-year-old obese patient lying in the bed in no acute distress.  EYES: Pupils equal, round, reactive to light and accommodation. No scleral icterus. Extraocular muscles intact.  HEENT: Head atraumatic, normocephalic. Oropharynx and nasopharynx clear.  NECK:  Supple, no jugular venous distention. No thyroid enlargement, no tenderness.  LUNGS: Normal breath sounds bilaterally, no wheezing, rales, rhonchi. No use of accessory muscles of respiration.  CARDIOVASCULAR: S1, S2 normal. No murmurs, rubs, or gallops.  ABDOMEN: Soft, NT, nondistended. Bowel sounds present. No organomegaly  or mass.  EXTREMITIES: No cyanosis, clubbing or edema b/l.    NEUROLOGIC: Cranial nerves II through XII are intact. No focal Motor or sensory deficits b/l.  Globally weak PSYCHIATRIC: The patient is alert and oriented x 3.  SKIN: No obvious rash, lesion, or ulcer.    LABORATORY PANEL:   CBC  Recent Labs Lab 01/10/16 0324  WBC 6.4  HGB 7.5*  HCT 22.9*  PLT 122*   ------------------------------------------------------------------------------------------------------------------  Chemistries   Recent Labs Lab 01/06/16 0527  01/10/16 0324  NA 140  < > 143  K 4.5  < > 4.3  CL 113*  < > 118*  CO2 18*  < > 20*  GLUCOSE 245*  < > 118*  BUN 58*  < > 37*  CREATININE 4.01*  < > 2.05*  CALCIUM 7.6*  < > 7.9*  AST 29  --   --   ALT 22  --   --   ALKPHOS 154*  --   --   BILITOT 0.8  --   --   < > = values in this interval not displayed. ------------------------------------------------------------------------------------------------------------------  Cardiac Enzymes  Recent Labs Lab 01/06/16 0527  TROPONINI 0.09*   ------------------------------------------------------------------------------------------------------------------  RADIOLOGY:  No results found.   ASSESSMENT AND PLAN:   81 year old female with past medical history of pancreatic cancer, chronic kidney disease stage III, diabetes, history of breast, uterine cancer, hyperlipidemia, GERD, recent admission for Escherichia coli sepsis status post biliary stent placement who presents to the hospital due to altered mental status.  1. Sepsis-source unclear. Patient had a recent admission secondary to Escherichia coli bacteremia and cholangitis. -LFTs are  currently stable and her CT scan of abdomen pelvis on admission did not show any acute abnormality. -Patient's urine cultures growing Proteus and blood cultures growing Streptococcus, Klebsiella.  Repeat blood cultures from 22nd of May are negative to date. ID  recommends antibiotics until June 5.  Started On Rocephin.  Physical therapy consult today. -follow full culture data from port and blood cultures, 2. Altered mental status-metabolic encephalopathy due to sepsis/hypoglycemia. -Much improved mental status back to baseline nowl. - cont. Supportive care to treat underlying sepsis, BS stable.   3. Abdominal/Hip pain - etiology unclear.  Non-specific.  -Improvted, x-ray of the abdomen, pelvis was negative yesterday. likely due   to fall,musculoskeletal, 4. Hypoglycemia-secondary to sepsis. Resolved - follow BS  5. Leukocytosis-secondary to sepsis. -Now normalized  6. Pancreatic cancer stage IV-status post recent biliary stent placement during previous hospitalization. LFTs currently stable. No acute abdominal pain.  - appreciate Palliative care consult and pt. Has reversed her code status to FULL CODE.    7. Acute on chronic renal failure-this is secondary to ATN and sepsis. Baseline creatinine is around 2.2-3  - Improved with IV fluids and close to baseline.  Cont. PT.     All the records are reviewed and case discussed with Care Management/Social Workerr. Management plans discussed with the patient, family and they are in agreement.  CODE STATUS: DO NOT RESUSCITATE  DVT Prophylaxis: Ted's & SCD's.   TOTAL Critical Care TIME TAKING CARE OF THIS PATIENT: 20 minutes.   POSSIBLE D/C IN 2-3 DAYS, DEPENDING ON CLINICAL CONDITION.   Epifanio Lesches M.D on 01/10/2016 at 10:34 AM  Between 7am to 6pm - Pager - [phone]  After 6pm go to [email or web address] - password [email or web address] [place] [facility]  Office  [phone]  CC: Primary care physician; Raylene Miyamoto., MD

## 2016-01-10 NOTE — Evaluation (Signed)
Physical Therapy Evaluation Patient Details Name: Pamela Preston MRN: [id number] DOB: [birth year] Today's Date: 01/10/2016   History of Present Illness  Pamela Preston is a 82 y.o. female with a known history of Diabetes, hypertension, CKD stage IV, obesity, stage IV pancreatic cancer presents to the emergency room brought in from nursing home due to altered mental status and found to have hypoglycemia with blood sugar at 47. Patient was initially given something to eat and then sugar. D50 was given and blood sugar improved to 195 in the emergency room. Patient is unable to contribute to history due to confusion. History obtained from sister at bedside and reviewing old records and emergency room staff. She was recently admitted for cholangitis with Escherichia coli bacteremia. Discharged on ciprofloxacin. Patient improved well with that episode. She continues to go through her chemotherapy and radiation for his stage IV pancreaticcancer. Here patient has been found to have hypotension, tachycardia, WBC count of 25,000 with acute renal failure. Pt has been at [facility] but would like to discharge to [facility] per CSW notes. Pt was supposed to transfer today but was until to discharge due to uncontrolled pain. Pt denies falls in the last 12 months.   Clinical Impression  Pt is extremely weak during PT evaluation. She is also in a lot of pain and tolerates minimal activity. She is able to sit upright with mostly CGA but has some episodes where she loses her balance backwards. Pt is maxA+2 for sit to stand transfers and to shift from recliner to bed. She is unable to truly ambulate at this time. Pt will need extensive rehab to return to prior level of function. Given her current condition overall prognosis for regaining full function is extremely guarded. Pt will benefit from skilled PT services to address deficits in strength, balance, and mobility in order to return to full function at home.      Follow Up Recommendations SNF    Equipment Recommendations  None recommended by PT    Recommendations for Other Services       Precautions / Restrictions Precautions Precautions: Fall Restrictions Weight Bearing Restrictions: No      Mobility  Bed Mobility Overal bed mobility: Needs Assistance Bed Mobility: Sit to Supine       Sit to supine: Max assist;+2 for physical assistance   General bed mobility comments: Pt requires assist for trunk and LE when returning to bed. Heavy cues for sequencing. Pt is exceedingly weak  Transfers Overall transfer level: Needs assistance Equipment used: Rolling walker (2 wheeled) Transfers: Sit to/from Stand Sit to Stand: +2 physical assistance;Max assist         General transfer comment: Pt requires heavy cues and set-up for sit to stand transfer. Once upright she requires heavy UE support to remain in standing. Pt able to perform small shuffling steps for chair to bed transfer. Very unsafe with LE buckling and poor safety awareness. Unable to truly ambulate at this time  Ambulation/Gait             General Gait Details: Unable  Stairs            Wheelchair Mobility    Modified Rankin (Stroke Patients Only)       Balance Overall balance assessment: Needs assistance Sitting-balance support: No upper extremity supported Sitting balance-Leahy Scale: Fair Sitting balance - Comments: Pt sits with CGA mostly however has intermittent LOB posterior requiring assist   Standing balance support: Bilateral upper extremity supported Standing balance-Leahy  Scale: Poor Standing balance comment: Pt requiring heavy UE support in standing as well as support from therapist                             Pertinent Vitals/Pain Pain Assessment: 0-10 Pain Score: 8  Pain Location: RUQ abdomen Pain Intervention(s): Monitored during session;Limited activity within patient's tolerance;Premedicated before session    [place] expects to be discharged to:: Skilled nursing facility                 Additional Comments: Before SNF admission pt lived alone at single story home. 3 stairs to enter with R railing. She was independent for ambulation with cane    Prior Function Level of Independence: Independent with assistive device(s)         Comments: Pt utilizing single point cane for ambulation. Pt reports prolonged period where she has not ambulated     Hand Dominance   Dominant Hand: Right    Extremity/Trunk Assessment   Upper Extremity Assessment: Generalized weakness;RUE deficits/detail RUE Deficits / Details: <3/5 bilateral shoulder flexion. 3+/5 elbow flexion/extension. Decreased grip strength.          Lower Extremity Assessment: Generalized weakness;RLE deficits/detail RLE Deficits / Details: Bilateral LE weakness. Grossly 4- to 4/5 throughout LE. Bilateral LE buckling in standing noted       Communication   Communication: No difficulties  Cognition Arousal/Alertness: Awake/alert Behavior During Therapy: Restless Overall Cognitive Status: Within Functional Limits for tasks assessed                      General Comments      Exercises        Assessment/Plan    PT Assessment Patient needs continued PT services  PT Diagnosis Difficulty walking;Abnormality of gait;Generalized weakness;Acute pain   PT Problem List Decreased strength;Decreased activity tolerance;Decreased mobility;Decreased balance;Decreased safety awareness;Pain;Obesity  PT Treatment Interventions DME instruction;Gait training;Stair training;Functional mobility training;Therapeutic activities;Therapeutic exercise;Balance training;Neuromuscular re-education;Patient/family education   PT Goals (Current goals can be found in the Care Plan section) Acute Rehab PT Goals Patient Stated Goal: Return to prior function at home PT Goal Formulation: With patient Time For Goal Achievement:  01/24/16 Potential to Achieve Goals: Poor    Frequency Min 2X/week   Barriers to discharge        Co-evaluation               End of Session Equipment Utilized During Treatment: Gait belt Activity Tolerance: Patient limited by pain;Patient limited by fatigue Patient left: in bed;with call bell/phone within reach;with nursing/sitter in room;Other (comment) (CNA cleaning patient) Nurse Communication: Mobility status         Time: [id number] PT Time Calculation (min) (ACUTE ONLY): 37 min   Charges:   PT Evaluation $PT Eval Moderate Complexity: 1 Procedure PT Treatments $Therapeutic Activity: 8-22 mins   PT G Codes:       Pamela Preston PT, DPT   Preston,Jason 01/10/2016, 5:07 PM

## 2016-01-11 LAB — GLUCOSE, CAPILLARY
GLUCOSE-CAPILLARY: 155 mg/dL — AB (ref 65–99)
Glucose-Capillary: 113 mg/dL — ABNORMAL HIGH (ref 65–99)
Glucose-Capillary: 148 mg/dL — ABNORMAL HIGH (ref 65–99)
Glucose-Capillary: 148 mg/dL — ABNORMAL HIGH (ref 65–99)

## 2016-01-11 MED ORDER — HYDROCODONE-ACETAMINOPHEN 5-325 MG PO TABS: 1.0000 | ORAL_TABLET | Freq: Four times a day (QID) | ORAL | Status: AC | PRN

## 2016-01-11 MED ORDER — METOPROLOL SUCCINATE ER 25 MG PO TB24: 25.0000 mg | ORAL_TABLET | Freq: Every day | ORAL | Status: AC

## 2016-01-11 MED ORDER — AMOXICILLIN-POT CLAVULANATE 875-125 MG PO TABS: 1.0000 | ORAL_TABLET | Freq: Two times a day (BID) | ORAL | Status: AC

## 2016-01-11 MED ORDER — AMLODIPINE BESYLATE 5 MG PO TABS: 5.0000 mg | ORAL_TABLET | Freq: Every day | ORAL | Status: AC

## 2016-01-11 NOTE — Progress Notes (Signed)
Pt prepared for d/c to SNF. Right chest port was d/c'd. Skin intact except as charted in most recent assessments. Vitals are stable. Report called to receiving facility. Pt to be transported by ambulance service.  Angus Seller

## 2016-01-11 NOTE — Clinical Social Work Note (Signed)
Patient to discharge today to go to [facility]. Discharge information sent to [facility]. Patient and sister are aware of discharge and are in agreement. Patient and sister request EMS transport.  Shela Leff MSW,LCSW [phone]

## 2016-01-11 NOTE — Progress Notes (Signed)
Right chest port was deacessed by Ines Bloomer, RN, no signs of bleeding. Report was given to receiving nurse at [facility]. EMS was called. Now awaiting for transport.   Pamela Preston

## 2016-01-11 NOTE — Discharge Summary (Signed)
Pamela Preston, is a 82 y.o. female  DOB July 25, 1930  MRN [id number].  Admission date:  01/05/2016  Admitting Physician  Hillary Bow, MD  Discharge Date:  01/11/2016   Primary MD  Raylene Miyamoto., MD  Recommendations for primary care physician for things to follow:  Follow-up with the oncologist in 1 week Follow up with Dr. Ola Spurr in one week in 1 week  Admission Diagnosis  Lactic acidosis [E87.2] Acute on chronic renal failure ([place]) [N17.9, N18.9] Sepsis ([place]) [A41.9] Sepsis, due to unspecified organism [facility]) [A41.9]   Discharge Diagnosis  Lactic acidosis [E87.2] Acute on chronic renal failure ([place]) [N17.9, N18.9] Sepsis ([place]) [A41.9] Sepsis, due to unspecified organism [facility] [A41.9]    Active Problems:   Septic shock ([place])   Acute on chronic renal failure ([facility])   Lactic acidosis   Arterial hypotension   Poor appetite   Dehydration   Abdominal pain      Past Medical History  Diagnosis Date  . Hypertension   . GERD (gastroesophageal reflux disease)   . Diabetes mellitus without complication ([place])   . Breast cancer ([place]) 2013    c Lumpectomy 2013  . Uterine cancer ([place]) 1960  . Pancreatic cancer ([place])   . Abnormal LFTs   . Gout   . Thyroid disease   . Hyperlipidemia     Past Surgical History  Procedure Laterality Date  . Abdominal hysterectomy    . Knee arthroscopy    . Ercp N/A 09/04/2015    Procedure: ENDOSCOPIC RETROGRADE CHOLANGIOPANCREATOGRAPHY (ERCP);  Surgeon: Hulen Luster, MD;  Location: [facility] ENDOSCOPY;  Service: Gastroenterology;  Laterality: N/A;  . Breast biopsy Right 2012    Positive  . Upper esophageal endoscopic ultrasound (eus) N/A 10/04/2015    Procedure: UPPER ESOPHAGEAL ENDOSCOPIC ULTRASOUND (EUS);  Surgeon: Holly Bodily, MD;  Location: [facility] ENDOSCOPY;  Service:  Gastroenterology;  Laterality: N/A;  . Peripheral vascular catheterization N/A 11/05/2015    Procedure: Porta Cath Insertion;  Surgeon: Algernon Huxley, MD;  Location: [place] CV LAB;  Service: Cardiovascular;  Laterality: N/A;  . Ercp N/A 11/22/2015    Procedure: ENDOSCOPIC RETROGRADE CHOLANGIOPANCREATOGRAPHY (ERCP);  Surgeon: Hulen Luster, MD;  Location: [facility] ENDOSCOPY;  Service: Endoscopy;  Laterality: N/A;  For Thursday afternoon       History of present illness and  Hospital Course:     Kindly see H&P for history of present illness and admission details, please review complete Labs, Consult reports and Test reports for all details in brief  HPI  from the history and physical done on the day of admission 82 year old female patient with history of diabetes mellitus, essential hypertension, stage IV pancreatic cancer, chronic kidney disease stage IV came in because of altered mental status and also found to have hypoglycemia with blood sugar 47 on admission. Admitted for septic shock. Admitted to intensive care unit.   Hospital Course  #1 septic shock with unknown source secondary to klebsiella, streptococci bacteremia. Patient had history of stage IV bladder cancer with biliary stent. CT abdomen on admission showed no evidence of urinary obstruction or infection. WBC on admission 25 dropped to 6.4. Patient received IV hydration, IV antibiotics with vancomycin, Zosyn. Blood cultures from admission showed. Streptococci, Enterobacter. Urine culture showed more than 100,000 colonies of Proteus. The repeat blood cultures from May 22 did not show any growth. Patient has been afebrile. Patient has seen by Dr. Ola Spurr who recommended oral antibiotics Augmentin 875 mg by mouth twice a day  14 days. Cultures from the port and also periphery are done. Patient clinically improved. Hesitant to remove the port without definitive needed. For septic shock she required IV fluids and also Levophed.  Patient  transferred out of ICU after her blood pressure improved.  #2 severe malnutrition and the contest of chronic illness. Patient advised to continue ensure, Magic cups and also continue regular diet. Patient is diabetic but she is not eating well.  #3 metabolic encephalopathy due to sepsis, hypoglycemia improved after correcting the sepsis. Her sugars also improved. #4 deconditioning physical therapy recommended rehabilitation, patient will go to [facility] rehabilitation today. #5 chronic kidney disease stage IV: Had acute on chronic renal failure, ATN secondary to sepsis. Creatinine on admission 5.22. Improved with hydration, renal function improved, patient creatinine is 2.09 with the BUN 37.  #6 essential hypertension.: Controlled. Hypertension improved so we adjusted the medicines are blood pressure, Norvasc was decreased to 2.5 mg by mouth daily. Stopped  Lasix. Decrease the Toprol-XL to 25 mg by mouth daily.  #7 pain on right side.; Patient has chronic pain and requiring pain medication prescription for Norco 5/325 mg q 6hrs.  #8 history of stage IV lung cancer;;chemo,RT 19 diabetes mellitus type 2: Patient blood sugars are improved. On the glipizide, Levemir at this time but depending on her by mouth intake these medications can be given otherwise patient needs adjustment of his medications depending on her blood sugar level.  Discharge Condition: stable  Follow UP      Discharge Instructions  and  Discharge Medications        Medication List    TAKE these medications        amLODipine 10 MG tablet  Commonly known as:  NORVASC  Take 10 mg by mouth daily.     amoxicillin-clavulanate 875-125 MG tablet  Commonly known as:  AUGMENTIN  Take 1 tablet by mouth 2 (two) times daily.     aspirin EC 81 MG tablet  Take 81 mg by mouth daily.     buPROPion 150 MG 12 hr tablet  Commonly known as:  WELLBUTRIN SR  Take 150 mg by mouth 2 (two) times daily. Reported on 12/06/2015      furosemide 20 MG tablet  Commonly known as:  LASIX  Take 20 mg by mouth daily.     glipiZIDE 5 MG tablet  Commonly known as:  GLUCOTROL  Take 5 mg by mouth daily before breakfast.     HYDROcodone-acetaminophen 5-325 MG tablet  Commonly known as:  NORCO/VICODIN  Take 1 tablet by mouth every 6 (six) hours as needed for moderate pain.     letrozole 2.5 MG tablet  Commonly known as:  FEMARA  Take 2.5 mg by mouth daily.     LEVEMIR 100 UNIT/ML injection  Generic drug:  insulin detemir  Inject 12-16 Units into the skin See admin instructions. Inject 16 units subcutaneous every morning at 7 am, and Inject 12 units subcutaneous every evening at 6 pm.     levothyroxine 75 MCG tablet  Commonly known as:  SYNTHROID, LEVOTHROID  Take 75 mcg by mouth daily before breakfast.     lidocaine 4 % cream  Commonly known as:  LMX  Apply 1 application topically See admin instructions. Apply lidocaine with tegaderm to port a cath area prior to chemo appointment once a day on Monday, Tuesday, Wednesday, Thursday, and Friday only.     lisinopril 20 MG tablet  Commonly known as:  PRINIVIL,ZESTRIL  Take 20 mg  by mouth daily.     loperamide 2 MG capsule  Commonly known as:  IMODIUM  Take 1 capsule by mouth every 6 hours as needed for diarrhea.     metoprolol succinate 50 MG 24 hr tablet  Commonly known as:  TOPROL-XL  Take 50 mg by mouth daily. Take with or immediately following a meal.     omeprazole 20 MG capsule  Commonly known as:  PRILOSEC  Take 20 mg by mouth daily.     ondansetron 4 MG tablet  Commonly known as:  ZOFRAN  Take 4 mg by mouth 3 (three) times daily.     POLYETHYLENE GLYCOL 3350 PO  Take 17 g by mouth daily as needed (for constipation). *Mix in 4-8 oz liquid of choice*     Potassium Chloride ER 20 MEQ Tbcr  Take 20 mEq by mouth 2 (two) times daily.     pravastatin 40 MG tablet  Commonly known as:  PRAVACHOL  Take 40 mg by mouth daily.     promethazine 25 MG tablet   Commonly known as:  PHENERGAN  Take 25 mg by mouth every 6 (six) hours.     ULORIC 80 MG Tabs  Generic drug:  Febuxostat  Take 80 mg by mouth daily.          Diet and Activity recommendation: See Discharge Instructions above   Consults obtained -   Major procedures and Radiology Reports - PLEASE review detailed and final reports for all details, in brief -      Ct Abdomen Pelvis Wo Contrast  01/05/2016  CLINICAL DATA:  Altered mental status, hypoglycemia. Recent hospital admission for cholangitis. Stage IV pancreatic cancer. EXAM: CT ABDOMEN AND PELVIS WITHOUT CONTRAST TECHNIQUE: Multidetector CT imaging of the abdomen and pelvis was performed following the standard protocol without IV contrast. COMPARISON:  CT abdomen dated 11/19/2015. FINDINGS: Lower chest:  Stable mild atelectasis at each lung base. Hepatobiliary: Biliary stent in place, grossly well-positioned traversing the expected area of the ampulla. No significant bile duct dilatation appreciated. Gallbladder is mildly distended but otherwise unremarkable. Expected pneumobilia. Liver partially obscured by beam hardening artifact from patient's overlying arms, limiting characterization, but no focal mass or lesion is identified in the liver. Pancreas: Pancreas appears stable.  No peripancreatic fluid. Spleen: Within normal limits in size. Adrenals/Urinary Tract: Adrenal glands appear stable. Stable masses exophytic to the lateral cortices of each kidney, difficult to definitively characterize without IV contrast, but grossly stable compared to multiple prior CTs dating back to 2012 suggesting benignity. No renal stone or hydronephrosis. No ureteral or bladder calculi identified. Bladder appears normal. Stomach/Bowel: Bowel is normal in caliber. No bowel wall thickening or evidence of bowel wall inflammation seen. Stomach is unremarkable. Appendix is normal. Vascular/Lymphatic: Scattered atherosclerotic changes of the normal- caliber  abdominal aorta. No enlarged lymph nodes appreciated in the abdomen or pelvis. Reproductive: No mass or other significant abnormality. Other: No free fluid or abscess collection seen. No free intraperitoneal air. Musculoskeletal: Degenerative changes throughout the thoracolumbar spine but no acute osseous abnormality seen. No evidence of osseous metastasis seen. Superficial soft tissues are unremarkable. IMPRESSION: 1. No acute findings. No free fluid or inflammatory change. No free intraperitoneal air. No bowel obstruction. 2. Appropriate positioning of the biliary stent. Associated pneumobilia. 3. Stable mild atelectasis at each lung base. Electronically Signed   By: Franki Cabot M.D.   On: 01/05/2016 17:01   Dg Chest 1 View  01/05/2016  CLINICAL DATA:  82 year old female with  a history of weakness and possible sepsis EXAM: CHEST 1 VIEW COMPARISON:  12/18/2015 FINDINGS: Cardiomediastinal silhouette unchanged. Port catheter on the right chest wall via right IJ approach, unchanged. Low lung volumes. No confluent airspace disease. No pleural effusion or pneumothorax. IMPRESSION: Negative for acute cardiopulmonary disease. Signed, Dulcy Fanny. Earleen Newport, DO [facility] Radiology Electronically Signed   By: Corrie Mckusick D.O.   On: 01/05/2016 14:52   Dg Chest 2 View  12/18/2015  CLINICAL DATA:  Syncope.  History of breast and uterine carcinoma EXAM: CHEST  2 VIEW COMPARISON:  November 19, 2015. FINDINGS: Port-A-Cath tip is in the superior vena cava. No pneumothorax. There is no edema or consolidation. Heart size and pulmonary vascularity are normal. No adenopathy. No bone lesions. IMPRESSION: No edema or consolidation. Port-A-Cath tip in superior vena cava. No pneumothorax. Electronically Signed   By: Lowella Grip III M.D.   On: 12/18/2015 13:41   Dg Abd 1 View  01/07/2016  CLINICAL DATA:  Abdominal pain, stage IV pancreatic cancer, currently on chemotherapy and  radiation therapy, history ovarian cancer, chronic kidney disease, hypertension, diabetes mellitus, altered level of consciousness, fall, hypoglycemia EXAM: ABDOMEN - 1 VIEW COMPARISON:  Portable exam 1557 hours compared to CT abdomen and pelvis 01/05/2016 FINDINGS: Stool and gas in rectum. CBD wall stent in RIGHT upper quadrant with associated pneumobilia. Nonobstructive bowel gas pattern. Mild gaseous distention of stomach. No bowel wall thickening or ventral abdominal mass effect. Multilevel degenerative disc disease changes of the thoracolumbar spine. Degenerative changes of both hip joints greater on RIGHT. IMPRESSION: Nonobstructive bowel gas pattern. Electronically Signed   By: Lavonia Dana M.D.   On: 01/07/2016 16:25   Dg Pelvis Portable  01/07/2016  CLINICAL DATA:  Abdominal pain EXAM: PORTABLE PELVIS 1-2 VIEWS COMPARISON:  Portable exam 1557 hours compared to 2 CT abdomen and pelvis 01/05/2016 FINDINGS: Degenerative changes of both hip joints greater on RIGHT, which demonstrates a greater degree of joint space narrowing. No acute fracture, dislocation or bone destruction. Pelvis grossly intact. Degenerative disc and facet disease changes at visualized lower lumbar spine IMPRESSION: Degenerative changes of the hip joints and visualized lower lumbar spine. No acute abnormalities. Electronically Signed   By: Lavonia Dana M.D.   On: 01/07/2016 16:26    Micro Results    Recent Results (from the past 240 hour(s))  Blood culture (routine x 2)     Status: Abnormal   Collection Time: 01/05/16  1:50 PM  Result Value Ref Range Status   Specimen Description BLOOD RIGHT ASSIST CONTROL  Final   Special Requests BOTTLES DRAWN AEROBIC AND ANAEROBIC 1 CC  Final   Culture  Setup Time   Final    GRAM POSITIVE COCCI IN BOTH AEROBIC AND ANAEROBIC BOTTLES CRITICAL VALUE NOTED.  VALUE IS CONSISTENT WITH PREVIOUSLY REPORTED AND CALLED VALUE. CONFIRMED BY [facility]    Culture (A)  Final    STREPTOCOCCUS SPECIES IN  BOTH AEROBIC AND ANAEROBIC BOTTLES    Report Status 01/09/2016 FINAL  Final   Organism ID, Bacteria STREPTOCOCCUS SPECIES  Final      Susceptibility   Streptococcus species - MIC*    PENICILLIN Value in next row Sensitive      SENSITIVE<=0.12    ERYTHROMYCIN Value in next row Sensitive      SENSITIVE<=0.25    CEFTRIAXONE Value in next row Sensitive      SENSITIVE<=1.0    PENICILLIN Value in next row Sensitive  SENSITIVE<=0.12    CEFTRIAXONE Value in next row Sensitive      SENSITIVE<=1.0    * STREPTOCOCCUS SPECIES  Blood culture (routine x 2)     Status: Abnormal   Collection Time: 01/05/16  1:55 PM  Result Value Ref Range Status   Specimen Description BLOOD LEFT ASSIST CONTROL  Final   Special Requests BOTTLES DRAWN AEROBIC AND ANAEROBIC 7 CC  Final   Culture  Setup Time   Final    GRAM POSITIVE COCCI ANAEROBIC BOTTLE ONLY CRITICAL RESULT CALLED TO, READ BACK BY AND VERIFIED WITH: MATT MCBANE AT 0530 ON 01/06/16 RWW CONFIRMED BY [facility] GRAM NEGATIVE RODS AEROBIC BOTTLE ONLY CRITICAL RESULT CALLED TO, READ BACK BY AND VERIFIED WITH: SCOTT CHRISTY AT 0825 ON 01/06/16.Marland KitchenMarland KitchenShillington    Culture (A)  Final    STREPTOCOCCUS SPECIES ANAEROBIC BOTTLE ONLY KLEBSIELLA PNEUMONIAE AEROBIC BOTTLE ONLY REFER TO OTHER BLOOD CULTURE SET FOR STREPTOCOCCUS SPECIES SUSCEPTIBILITIES.    Report Status 01/09/2016 FINAL  Final   Organism ID, Bacteria KLEBSIELLA PNEUMONIAE  Final      Susceptibility   Klebsiella pneumoniae - MIC*    AMPICILLIN Value in next row Resistant      RESISTANT16    CEFAZOLIN Value in next row Sensitive      SENSITIVE<=4    CEFEPIME Value in next row Sensitive      SENSITIVE<=1    CEFTAZIDIME Value in next row Sensitive      SENSITIVE<=1    CEFTRIAXONE Value in next row Sensitive      SENSITIVE<=1    CIPROFLOXACIN Value in next row Sensitive      SENSITIVE1    GENTAMICIN Value in next row Sensitive      SENSITIVE<=1    IMIPENEM Value in next row Sensitive       SENSITIVE<=0.25    TRIMETH/SULFA Value in next row Sensitive      SENSITIVE<=20    AMPICILLIN/SULBACTAM Value in next row Sensitive      SENSITIVE4    PIP/TAZO Value in next row Sensitive      SENSITIVE<=4    Extended ESBL Value in next row Sensitive      SENSITIVE<=4    * KLEBSIELLA PNEUMONIAE  Blood Culture ID Panel (Reflexed)     Status: Abnormal   Collection Time: 01/05/16  1:55 PM  Result Value Ref Range Status   Enterococcus species NOT DETECTED NOT DETECTED Final   Vancomycin resistance NOT DETECTED NOT DETECTED Final   Listeria monocytogenes NOT DETECTED NOT DETECTED Final   Staphylococcus species NOT DETECTED NOT DETECTED Final   Staphylococcus aureus NOT DETECTED NOT DETECTED Final   Methicillin resistance NOT DETECTED NOT DETECTED Final   Streptococcus species DETECTED (A) NOT DETECTED Final    Comment: CRITICAL RESULT CALLED TO, READ BACK BY AND VERIFIED WITH: CALLED MATT MCBANE AT 0530 ON 01/06/16 RWW    Streptococcus agalactiae NOT DETECTED NOT DETECTED Final   Streptococcus pneumoniae NOT DETECTED NOT DETECTED Final   Streptococcus pyogenes NOT DETECTED NOT DETECTED Final   Acinetobacter baumannii NOT DETECTED NOT DETECTED Final   Enterobacteriaceae species NOT DETECTED NOT DETECTED Final   Enterobacter cloacae complex NOT DETECTED NOT DETECTED Final   Escherichia coli NOT DETECTED NOT DETECTED Final   Klebsiella oxytoca NOT DETECTED NOT DETECTED Final   Klebsiella pneumoniae NOT DETECTED NOT DETECTED Final   Proteus species NOT DETECTED NOT DETECTED Final   Serratia marcescens NOT DETECTED NOT DETECTED Final   Carbapenem resistance NOT DETECTED NOT DETECTED Final  Haemophilus influenzae NOT DETECTED NOT DETECTED Final   Neisseria meningitidis NOT DETECTED NOT DETECTED Final   Pseudomonas aeruginosa NOT DETECTED NOT DETECTED Final   Candida albicans NOT DETECTED NOT DETECTED Final   Candida glabrata NOT DETECTED NOT DETECTED Final   Candida krusei NOT DETECTED  NOT DETECTED Final   Candida parapsilosis NOT DETECTED NOT DETECTED Final   Candida tropicalis NOT DETECTED NOT DETECTED Final  Urine culture     Status: Abnormal   Collection Time: 01/05/16  5:48 PM  Result Value Ref Range Status   Specimen Description URINE, CATHETERIZED  Final   Special Requests Normal  Final   Culture >=100,000 COLONIES/mL PROTEUS MIRABILIS (A)  Final   Report Status 01/08/2016 FINAL  Final   Organism ID, Bacteria PROTEUS MIRABILIS (A)  Final      Susceptibility   Proteus mirabilis - MIC*    AMPICILLIN <=2 SENSITIVE Sensitive     CEFAZOLIN 8 SENSITIVE Sensitive     CEFTRIAXONE <=1 SENSITIVE Sensitive     CIPROFLOXACIN >=4 RESISTANT Resistant     GENTAMICIN <=1 SENSITIVE Sensitive     IMIPENEM 4 SENSITIVE Sensitive     NITROFURANTOIN 128 RESISTANT Resistant     TRIMETH/SULFA <=20 SENSITIVE Sensitive     AMPICILLIN/SULBACTAM <=2 SENSITIVE Sensitive     PIP/TAZO <=4 SENSITIVE Sensitive     * >=100,000 COLONIES/mL PROTEUS MIRABILIS  MRSA PCR Screening     Status: None   Collection Time: 01/05/16  5:49 PM  Result Value Ref Range Status   MRSA by PCR NEGATIVE NEGATIVE Final    Comment:        The GeneXpert MRSA Assay (FDA approved for NASAL specimens only), is one component of a comprehensive MRSA colonization surveillance program. It is not intended to diagnose MRSA infection nor to guide or monitor treatment for MRSA infections.   Blood Culture ID Panel (Reflexed)     Status: Abnormal   Collection Time: 01/06/16  8:00 AM  Result Value Ref Range Status   Enterococcus species NOT DETECTED NOT DETECTED Final   Vancomycin resistance NOT DETECTED NOT DETECTED Final   Listeria monocytogenes NOT DETECTED NOT DETECTED Final   Staphylococcus species NOT DETECTED NOT DETECTED Final   Staphylococcus aureus NOT DETECTED NOT DETECTED Final   Methicillin resistance NOT DETECTED NOT DETECTED Final   Streptococcus species DETECTED (A) NOT DETECTED Final    Comment:  CRITICAL RESULT CALLED TO, READ BACK BY AND VERIFIED WITH: SCOTT CHRISTY 01/06/16 0825 [place]    Streptococcus agalactiae NOT DETECTED NOT DETECTED Final   Streptococcus pneumoniae NOT DETECTED NOT DETECTED Final   Streptococcus pyogenes NOT DETECTED NOT DETECTED Final   Acinetobacter baumannii NOT DETECTED NOT DETECTED Final   Enterobacteriaceae species DETECTED (A) NOT DETECTED Final    Comment: CRITICAL RESULT CALLED TO, READ BACK BY AND VERIFIED WITH: SCOTT CHRISTY 01/06/16 0825 [place]    Enterobacter cloacae complex NOT DETECTED NOT DETECTED Final   Escherichia coli NOT DETECTED NOT DETECTED Final   Klebsiella oxytoca NOT DETECTED NOT DETECTED Final   Klebsiella pneumoniae DETECTED (A) NOT DETECTED Final    Comment: CRITICAL RESULT CALLED TO, READ BACK BY AND VERIFIED WITH: SCOTT CHRISTY 01/06/16 0825 [place]    Proteus species NOT DETECTED NOT DETECTED Final   Serratia marcescens NOT DETECTED NOT DETECTED Final   Carbapenem resistance NOT DETECTED NOT DETECTED Final   Haemophilus influenzae NOT DETECTED NOT DETECTED Final   Neisseria meningitidis NOT DETECTED NOT DETECTED Final   Pseudomonas aeruginosa NOT  DETECTED NOT DETECTED Final   Candida albicans NOT DETECTED NOT DETECTED Final   Candida glabrata NOT DETECTED NOT DETECTED Final   Candida krusei NOT DETECTED NOT DETECTED Final   Candida parapsilosis NOT DETECTED NOT DETECTED Final   Candida tropicalis NOT DETECTED NOT DETECTED Final  Culture, blood (single) w Reflex to ID Panel     Status: None (Preliminary result)   Collection Time: 01/07/16  5:05 PM  Result Value Ref Range Status   Specimen Description BLOOD A-LINE DRAW  Final   Special Requests BOTTLES DRAWN AEROBIC AND ANAEROBIC  5CC  Final   Culture NO GROWTH 3 DAYS  Final   Report Status PENDING  Incomplete  Culture, blood (single) w Reflex to ID Panel     Status: None (Preliminary result)   Collection Time: 01/07/16  5:37 PM  Result Value Ref Range Status   Specimen  Description BLOOD RIGHT ASSIST CONTROL  Final   Special Requests   Final    BOTTLES DRAWN AEROBIC AND ANAEROBIC  AERO12CC ANA5CC   Culture NO GROWTH 3 DAYS  Final   Report Status PENDING  Incomplete       Today   Subjective:   Jakiah Peragine today has no headache,no chest abdominal pain.stable to go   nursing home.  Objective:   Blood pressure 119/67, pulse 92, temperature 97.7 F (36.5 C), temperature source Oral, resp. rate 20, height 5\' 8"  (1.727 m), weight 109.317 kg (241 lb), SpO2 100 %.   Intake/Output Summary (Last 24 hours) at 01/11/16 1200 Last data filed at 01/11/16 0600  Gross per 24 hour  Intake    503 ml  Output      0 ml  Net    503 ml    Exam Awake Alert, Oriented x 3, No new F.N deficits, Normal affect [place].AT,PERRAL Supple Neck,No JVD, No cervical lymphadenopathy appriciated.  Symmetrical Chest wall movement, Good air movement bilaterally, CTAB RRR,No Gallops,Rubs or new Murmurs, No Parasternal Heave +ve B.Sounds, Abd Soft, Non tender, No organomegaly appriciated, No rebound -guarding or rigidity. No Cyanosis, Clubbing or edema, No new Rash or bruise  Data Review   CBC w Diff: Lab Results  Component Value Date   WBC 6.4 01/10/2016   WBC 6.1 11/19/2013   HGB 7.5* 01/10/2016   HGB 11.8* 11/19/2013   HCT 22.9* 01/10/2016   HCT 35.7 11/19/2013   PLT 122* 01/10/2016   PLT 266 11/19/2013   LYMPHOPCT 1% 01/05/2016   LYMPHOPCT 26.9 01/14/2013   BANDSPCT 2 11/22/2015   MONOPCT 4% 01/05/2016   MONOPCT 8.7 01/14/2013   EOSPCT 0% 01/05/2016   EOSPCT 4.2 01/14/2013   BASOPCT 1% 01/05/2016   BASOPCT 0.9 01/14/2013    CMP: Lab Results  Component Value Date   NA 143 01/10/2016   NA 138 11/19/2013   K 4.3 01/10/2016   K 3.8 11/19/2013   CL 118* 01/10/2016   CL 103 11/19/2013   CO2 20* 01/10/2016   CO2 30 11/19/2013   BUN 37* 01/10/2016   BUN 18 11/19/2013   CREATININE 2.05* 01/10/2016   CREATININE 2.31* 11/19/2013   PROT 5.8* 01/06/2016   PROT  7.4 11/19/2013   ALBUMIN 2.2* 01/06/2016   ALBUMIN 3.3* 11/19/2013   BILITOT 0.8 01/06/2016   BILITOT 0.4 11/19/2013   ALKPHOS 154* 01/06/2016   ALKPHOS 94 11/19/2013   AST 29 01/06/2016   AST 16 11/19/2013   ALT 22 01/06/2016   ALT 13 11/19/2013  .   Total Time in  preparing paper work, data evaluation and todays exam - 40 minutes  Jermisha Hoffart M.D on 01/11/2016 at 12:00 PM    Note: This dictation was prepared with Dragon dictation along with smaller phrase technology. Any transcriptional errors that result from this process are unintentional.

## 2016-01-11 NOTE — Clinical Social Work Placement (Signed)
   CLINICAL SOCIAL WORK PLACEMENT  NOTE  Date:  01/11/2016  Patient Details  Name: Pamela Preston MRN: [id number] Date of Birth: 05/29/1930  Clinical Social Work is seeking post-discharge placement for this patient at the [place] level of care (*CSW will initial, date and re-position this form in  chart as items are completed):  Yes   Patient/family provided with [place] Work Department's list of facilities offering this level of care within the geographic area requested by the patient (or if unable, by the patient's family).  Yes   Patient/family informed of their freedom to choose among providers that offer the needed level of care, that participate in Medicare, Medicaid or managed care program needed by the patient, have an available bed and are willing to accept the patient.  Yes   Patient/family informed of [facility]'s ownership interest in [facility] and [facility], as well as of the fact that they are under no obligation to receive care at these facilities.  PASRR submitted to EDS on       PASRR number received on       Existing PASRR number confirmed on 01/07/16     FL2 transmitted to all facilities in geographic area requested by pt/family on 01/07/16     FL2 transmitted to all facilities within larger geographic area on       Patient informed that his/her managed care company has contracts with or will negotiate with certain facilities, including the following:        Yes   Patient/family informed of bed offers received.  Patient chooses bed at  [facility])     Physician recommends and patient chooses bed at  [facility])    Patient to be transferred to [facility] on 01/11/16.  Patient to be transferred to facility by  (EMS)     Patient family notified on 01/11/16 of transfer.  Name of family member notified:   (sister)     PHYSICIAN       Additional Comment:     _______________________________________________ Shela Leff, LCSW 01/11/2016, 1:10 PM

## 2016-01-12 ENCOUNTER — Inpatient Hospital Stay: Admission: RE | Admit: 2016-01-12 | Payer: Self-pay | Source: Ambulatory Visit

## 2016-01-12 DIAGNOSIS — E119 Type 2 diabetes mellitus without complications: Secondary | ICD-10-CM | POA: Diagnosis present

## 2016-01-12 LAB — CULTURE, BLOOD (SINGLE)
Culture: NO GROWTH
Culture: NO GROWTH

## 2016-01-12 LAB — CBC WITH DIFFERENTIAL/PLATELET
BAND NEUTROPHILS: 1 %
BLASTS: 0 %
Basophils Absolute: 0 10*3/uL (ref 0–0.1)
Basophils Relative: 0 %
Eosinophils Absolute: 0.2 10*3/uL (ref 0–0.7)
Eosinophils Relative: 2 %
HEMATOCRIT: 27.5 % — AB (ref 35.0–47.0)
Hemoglobin: 8.6 g/dL — ABNORMAL LOW (ref 12.0–16.0)
LYMPHS PCT: 10 %
Lymphs Abs: 0.9 10*3/uL — ABNORMAL LOW (ref 1.0–3.6)
MCH: 30.6 pg (ref 26.0–34.0)
MCHC: 31.3 g/dL — AB (ref 32.0–36.0)
MCV: 97.7 fL (ref 80.0–100.0)
MONOS PCT: 6 %
Metamyelocytes Relative: 3 %
Monocytes Absolute: 0.5 10*3/uL (ref 0.2–0.9)
Myelocytes: 0 %
NEUTROS ABS: 7.1 10*3/uL — AB (ref 1.4–6.5)
NRBC: 0 /100{WBCs}
Neutrophils Relative %: 78 %
OTHER: 0 %
Platelets: 226 10*3/uL (ref 150–440)
Promyelocytes Absolute: 0 %
RBC: 2.81 MIL/uL — AB (ref 3.80–5.20)
RDW: 19 % — AB (ref 11.5–14.5)
WBC: 8.7 10*3/uL (ref 3.6–11.0)

## 2016-01-12 LAB — COMPREHENSIVE METABOLIC PANEL
ALT: 20 U/L (ref 14–54)
ANION GAP: 7 (ref 5–15)
AST: 26 U/L (ref 15–41)
Albumin: 2.1 g/dL — ABNORMAL LOW (ref 3.5–5.0)
Alkaline Phosphatase: 144 U/L — ABNORMAL HIGH (ref 38–126)
BILIRUBIN TOTAL: 0.3 mg/dL (ref 0.3–1.2)
BUN: 22 mg/dL — ABNORMAL HIGH (ref 6–20)
CALCIUM: 8.1 mg/dL — AB (ref 8.9–10.3)
CO2: 18 mmol/L — ABNORMAL LOW (ref 22–32)
Chloride: 119 mmol/L — ABNORMAL HIGH (ref 101–111)
Creatinine, Ser: 1.54 mg/dL — ABNORMAL HIGH (ref 0.44–1.00)
GFR calc Af Amer: 35 mL/min — ABNORMAL LOW (ref >60)
GFR, EST NON AFRICAN AMERICAN: 30 mL/min — AB (ref >60)
Glucose, Bld: 69 mg/dL (ref 65–99)
Potassium: 4.4 mmol/L (ref 3.5–5.1)
Sodium: 144 mmol/L (ref 135–145)
TOTAL PROTEIN: 5.4 g/dL — AB (ref 6.5–8.1)

## 2016-01-12 LAB — GLUCOSE, CAPILLARY
GLUCOSE-CAPILLARY: 91 mg/dL (ref 65–99)
GLUCOSE-CAPILLARY: 109 mg/dL — AB (ref 65–99)
Glucose-Capillary: 53 mg/dL — ABNORMAL LOW (ref 65–99)
Glucose-Capillary: 81 mg/dL (ref 65–99)

## 2016-01-13 DIAGNOSIS — E119 Type 2 diabetes mellitus without complications: Secondary | ICD-10-CM | POA: Diagnosis not present

## 2016-01-13 LAB — GLUCOSE, CAPILLARY
GLUCOSE-CAPILLARY: 63 mg/dL — AB (ref 65–99)
GLUCOSE-CAPILLARY: 126 mg/dL — AB (ref 65–99)
GLUCOSE-CAPILLARY: 76 mg/dL (ref 65–99)
GLUCOSE-CAPILLARY: 108 mg/dL — AB (ref 65–99)

## 2016-01-14 DIAGNOSIS — E119 Type 2 diabetes mellitus without complications: Secondary | ICD-10-CM | POA: Diagnosis not present

## 2016-01-14 LAB — GLUCOSE, CAPILLARY
GLUCOSE-CAPILLARY: 146 mg/dL — AB (ref 65–99)
Glucose-Capillary: 105 mg/dL — ABNORMAL HIGH (ref 65–99)
Glucose-Capillary: 152 mg/dL — ABNORMAL HIGH (ref 65–99)

## 2016-01-15 DIAGNOSIS — E119 Type 2 diabetes mellitus without complications: Secondary | ICD-10-CM | POA: Diagnosis not present

## 2016-01-15 LAB — GLUCOSE, CAPILLARY
GLUCOSE-CAPILLARY: 145 mg/dL — AB (ref 65–99)
GLUCOSE-CAPILLARY: 55 mg/dL — AB (ref 65–99)
GLUCOSE-CAPILLARY: 71 mg/dL (ref 65–99)

## 2016-01-16 LAB — GLUCOSE, CAPILLARY
GLUCOSE-CAPILLARY: 139 mg/dL — AB (ref 65–99)
GLUCOSE-CAPILLARY: 154 mg/dL — AB (ref 65–99)
GLUCOSE-CAPILLARY: 44 mg/dL — AB (ref 65–99)
GLUCOSE-CAPILLARY: 115 mg/dL — AB (ref 65–99)
GLUCOSE-CAPILLARY: 143 mg/dL — AB (ref 65–99)
Glucose-Capillary: 185 mg/dL — ABNORMAL HIGH (ref 65–99)
Glucose-Capillary: 132 mg/dL — ABNORMAL HIGH (ref 65–99)

## 2016-01-17 ENCOUNTER — Encounter
Admission: RE | Admit: 2016-01-17 | Discharge: 2016-01-17 | Disposition: A | Discharge status: Home / Self Care | Payer: Medicare Other | Source: Ambulatory Visit | Attending: Internal Medicine | Admitting: Internal Medicine

## 2016-01-17 DIAGNOSIS — N189 Chronic kidney disease, unspecified: Secondary | ICD-10-CM | POA: Insufficient documentation

## 2016-01-17 DIAGNOSIS — E119 Type 2 diabetes mellitus without complications: Secondary | ICD-10-CM | POA: Diagnosis not present

## 2016-01-17 LAB — GLUCOSE, CAPILLARY
GLUCOSE-CAPILLARY: 49 mg/dL — AB (ref 65–99)
GLUCOSE-CAPILLARY: 240 mg/dL — AB (ref 65–99)
Glucose-Capillary: 187 mg/dL — ABNORMAL HIGH (ref 65–99)

## 2016-01-18 DIAGNOSIS — E119 Type 2 diabetes mellitus without complications: Secondary | ICD-10-CM | POA: Diagnosis not present

## 2016-01-18 LAB — GLUCOSE, CAPILLARY
GLUCOSE-CAPILLARY: 62 mg/dL — AB (ref 65–99)
GLUCOSE-CAPILLARY: 154 mg/dL — AB (ref 65–99)
GLUCOSE-CAPILLARY: 251 mg/dL — AB (ref 65–99)
Glucose-Capillary: 143 mg/dL — ABNORMAL HIGH (ref 65–99)
Glucose-Capillary: 175 mg/dL — ABNORMAL HIGH (ref 65–99)

## 2016-01-19 DIAGNOSIS — E119 Type 2 diabetes mellitus without complications: Secondary | ICD-10-CM | POA: Diagnosis not present

## 2016-01-19 LAB — GLUCOSE, CAPILLARY
GLUCOSE-CAPILLARY: 48 mg/dL — AB (ref 65–99)
GLUCOSE-CAPILLARY: 115 mg/dL — AB (ref 65–99)
GLUCOSE-CAPILLARY: 82 mg/dL (ref 65–99)
Glucose-Capillary: 115 mg/dL — ABNORMAL HIGH (ref 65–99)
Glucose-Capillary: 143 mg/dL — ABNORMAL HIGH (ref 65–99)
Glucose-Capillary: 121 mg/dL — ABNORMAL HIGH (ref 65–99)

## 2016-01-20 DIAGNOSIS — E119 Type 2 diabetes mellitus without complications: Secondary | ICD-10-CM | POA: Diagnosis not present

## 2016-01-20 LAB — GLUCOSE, CAPILLARY
GLUCOSE-CAPILLARY: 90 mg/dL (ref 65–99)
Glucose-Capillary: 46 mg/dL — ABNORMAL LOW (ref 65–99)
Glucose-Capillary: 120 mg/dL — ABNORMAL HIGH (ref 65–99)
Glucose-Capillary: 172 mg/dL — ABNORMAL HIGH (ref 65–99)
Glucose-Capillary: 159 mg/dL — ABNORMAL HIGH (ref 65–99)

## 2016-01-21 DIAGNOSIS — E119 Type 2 diabetes mellitus without complications: Secondary | ICD-10-CM | POA: Diagnosis not present

## 2016-01-21 LAB — GLUCOSE, CAPILLARY
GLUCOSE-CAPILLARY: 84 mg/dL (ref 65–99)
GLUCOSE-CAPILLARY: 166 mg/dL — AB (ref 65–99)
Glucose-Capillary: 161 mg/dL — ABNORMAL HIGH (ref 65–99)
Glucose-Capillary: 180 mg/dL — ABNORMAL HIGH (ref 65–99)

## 2016-01-22 DIAGNOSIS — E119 Type 2 diabetes mellitus without complications: Secondary | ICD-10-CM | POA: Diagnosis not present

## 2016-01-22 LAB — GLUCOSE, CAPILLARY
GLUCOSE-CAPILLARY: 47 mg/dL — AB (ref 65–99)
GLUCOSE-CAPILLARY: 129 mg/dL — AB (ref 65–99)
Glucose-Capillary: 133 mg/dL — ABNORMAL HIGH (ref 65–99)
Glucose-Capillary: 205 mg/dL — ABNORMAL HIGH (ref 65–99)

## 2016-01-23 DIAGNOSIS — E119 Type 2 diabetes mellitus without complications: Secondary | ICD-10-CM | POA: Diagnosis not present

## 2016-01-23 LAB — GLUCOSE, CAPILLARY
GLUCOSE-CAPILLARY: 91 mg/dL (ref 65–99)
GLUCOSE-CAPILLARY: 145 mg/dL — AB (ref 65–99)
Glucose-Capillary: 180 mg/dL — ABNORMAL HIGH (ref 65–99)
Glucose-Capillary: 152 mg/dL — ABNORMAL HIGH (ref 65–99)

## 2016-01-25 DIAGNOSIS — E119 Type 2 diabetes mellitus without complications: Secondary | ICD-10-CM | POA: Diagnosis not present

## 2016-01-25 LAB — GLUCOSE, CAPILLARY
GLUCOSE-CAPILLARY: 31 mg/dL — AB (ref 65–99)
GLUCOSE-CAPILLARY: 114 mg/dL — AB (ref 65–99)
GLUCOSE-CAPILLARY: 88 mg/dL (ref 65–99)
Glucose-Capillary: 84 mg/dL (ref 65–99)
Glucose-Capillary: 164 mg/dL — ABNORMAL HIGH (ref 65–99)
Glucose-Capillary: 240 mg/dL — ABNORMAL HIGH (ref 65–99)
Glucose-Capillary: 224 mg/dL — ABNORMAL HIGH (ref 65–99)
Glucose-Capillary: 85 mg/dL (ref 65–99)
Glucose-Capillary: 123 mg/dL — ABNORMAL HIGH (ref 65–99)

## 2016-01-26 DIAGNOSIS — E119 Type 2 diabetes mellitus without complications: Secondary | ICD-10-CM | POA: Diagnosis not present

## 2016-01-26 LAB — GLUCOSE, CAPILLARY
GLUCOSE-CAPILLARY: 129 mg/dL — AB (ref 65–99)
Glucose-Capillary: 77 mg/dL (ref 65–99)
Glucose-Capillary: 130 mg/dL — ABNORMAL HIGH (ref 65–99)
Glucose-Capillary: 87 mg/dL (ref 65–99)

## 2016-01-27 DIAGNOSIS — E119 Type 2 diabetes mellitus without complications: Secondary | ICD-10-CM | POA: Diagnosis not present

## 2016-01-27 LAB — GLUCOSE, CAPILLARY
GLUCOSE-CAPILLARY: 175 mg/dL — AB (ref 65–99)
Glucose-Capillary: 74 mg/dL (ref 65–99)
Glucose-Capillary: 192 mg/dL — ABNORMAL HIGH (ref 65–99)
Glucose-Capillary: 234 mg/dL — ABNORMAL HIGH (ref 65–99)

## 2016-01-28 DIAGNOSIS — E119 Type 2 diabetes mellitus without complications: Secondary | ICD-10-CM | POA: Diagnosis not present

## 2016-01-28 LAB — GLUCOSE, CAPILLARY: GLUCOSE-CAPILLARY: 62 mg/dL — AB (ref 65–99)

## 2016-01-29 LAB — GLUCOSE, CAPILLARY
GLUCOSE-CAPILLARY: 87 mg/dL (ref 65–99)
GLUCOSE-CAPILLARY: 95 mg/dL (ref 65–99)
GLUCOSE-CAPILLARY: 193 mg/dL — AB (ref 65–99)
Glucose-Capillary: 116 mg/dL — ABNORMAL HIGH (ref 65–99)
Glucose-Capillary: 135 mg/dL — ABNORMAL HIGH (ref 65–99)
Glucose-Capillary: 273 mg/dL — ABNORMAL HIGH (ref 65–99)
Glucose-Capillary: 226 mg/dL — ABNORMAL HIGH (ref 65–99)
Glucose-Capillary: 42 mg/dL — CL (ref 65–99)
Glucose-Capillary: 200 mg/dL — ABNORMAL HIGH (ref 65–99)

## 2016-01-30 ENCOUNTER — Other Ambulatory Visit: Payer: Self-pay | Admitting: Internal Medicine

## 2016-01-30 ENCOUNTER — Other Ambulatory Visit: Payer: Self-pay | Admitting: *Deleted

## 2016-01-30 DIAGNOSIS — C250 Malignant neoplasm of head of pancreas: Secondary | ICD-10-CM

## 2016-01-30 DIAGNOSIS — E119 Type 2 diabetes mellitus without complications: Secondary | ICD-10-CM | POA: Diagnosis not present

## 2016-01-30 LAB — GLUCOSE, CAPILLARY
GLUCOSE-CAPILLARY: 75 mg/dL (ref 65–99)
GLUCOSE-CAPILLARY: 110 mg/dL — AB (ref 65–99)
GLUCOSE-CAPILLARY: 164 mg/dL — AB (ref 65–99)
Glucose-Capillary: 159 mg/dL — ABNORMAL HIGH (ref 65–99)

## 2016-01-30 NOTE — Progress Notes (Signed)
RN Received msg from cancer center scheduling. "Pt is in rehab. Pt requesting to cnl apt on 6/16 appt as she is is not mobile yet. Pt requesting wait till the 02/14/16 to come. Family inquired if patient needs scan prior to md apt."  Pt currently at [facility].  RN spoke with MD. MD would like to see patient at the end of June. Pt will need a pet scan-restaging pancreatic cancer.  Per md, may need to discuss hospice/palliative care with patient/caregivers at that apt.  Msg sent to cancer center sch. To r/s the 02/01/16 apt with md and to schedule the pet scan and md apt at the end of June 2017.

## 2016-01-31 DIAGNOSIS — E119 Type 2 diabetes mellitus without complications: Secondary | ICD-10-CM | POA: Diagnosis not present

## 2016-01-31 LAB — GLUCOSE, CAPILLARY
GLUCOSE-CAPILLARY: 50 mg/dL — AB (ref 65–99)
GLUCOSE-CAPILLARY: 128 mg/dL — AB (ref 65–99)
Glucose-Capillary: 120 mg/dL — ABNORMAL HIGH (ref 65–99)
Glucose-Capillary: 176 mg/dL — ABNORMAL HIGH (ref 65–99)

## 2016-02-01 ENCOUNTER — Inpatient Hospital Stay: Payer: Medicare Other | Admitting: Internal Medicine

## 2016-02-01 DIAGNOSIS — E119 Type 2 diabetes mellitus without complications: Secondary | ICD-10-CM | POA: Diagnosis not present

## 2016-02-02 LAB — GLUCOSE, CAPILLARY
GLUCOSE-CAPILLARY: 69 mg/dL (ref 65–99)
GLUCOSE-CAPILLARY: 125 mg/dL — AB (ref 65–99)
GLUCOSE-CAPILLARY: 137 mg/dL — AB (ref 65–99)
GLUCOSE-CAPILLARY: 30 mg/dL — AB (ref 65–99)
Glucose-Capillary: 125 mg/dL — ABNORMAL HIGH (ref 65–99)
Glucose-Capillary: 77 mg/dL (ref 65–99)
Glucose-Capillary: 104 mg/dL — ABNORMAL HIGH (ref 65–99)
Glucose-Capillary: 179 mg/dL — ABNORMAL HIGH (ref 65–99)
Glucose-Capillary: 221 mg/dL — ABNORMAL HIGH (ref 65–99)

## 2016-02-03 DIAGNOSIS — E119 Type 2 diabetes mellitus without complications: Secondary | ICD-10-CM | POA: Diagnosis not present

## 2016-02-03 LAB — GLUCOSE, CAPILLARY
GLUCOSE-CAPILLARY: 43 mg/dL — AB (ref 65–99)
GLUCOSE-CAPILLARY: 216 mg/dL — AB (ref 65–99)
Glucose-Capillary: 107 mg/dL — ABNORMAL HIGH (ref 65–99)
Glucose-Capillary: 204 mg/dL — ABNORMAL HIGH (ref 65–99)

## 2016-02-04 DIAGNOSIS — E119 Type 2 diabetes mellitus without complications: Secondary | ICD-10-CM | POA: Diagnosis not present

## 2016-02-04 LAB — GLUCOSE, CAPILLARY
GLUCOSE-CAPILLARY: 64 mg/dL — AB (ref 65–99)
GLUCOSE-CAPILLARY: 301 mg/dL — AB (ref 65–99)
GLUCOSE-CAPILLARY: 133 mg/dL — AB (ref 65–99)
GLUCOSE-CAPILLARY: 200 mg/dL — AB (ref 65–99)

## 2016-02-05 DIAGNOSIS — E119 Type 2 diabetes mellitus without complications: Secondary | ICD-10-CM | POA: Diagnosis not present

## 2016-02-05 LAB — GLUCOSE, CAPILLARY
GLUCOSE-CAPILLARY: 54 mg/dL — AB (ref 65–99)
Glucose-Capillary: 109 mg/dL — ABNORMAL HIGH (ref 65–99)
Glucose-Capillary: 177 mg/dL — ABNORMAL HIGH (ref 65–99)
Glucose-Capillary: 183 mg/dL — ABNORMAL HIGH (ref 65–99)

## 2016-02-07 DIAGNOSIS — E119 Type 2 diabetes mellitus without complications: Secondary | ICD-10-CM | POA: Diagnosis not present

## 2016-02-07 LAB — GLUCOSE, CAPILLARY
Glucose-Capillary: 53 mg/dL — ABNORMAL LOW (ref 65–99)
Glucose-Capillary: 140 mg/dL — ABNORMAL HIGH (ref 65–99)
Glucose-Capillary: 162 mg/dL — ABNORMAL HIGH (ref 65–99)
Glucose-Capillary: 174 mg/dL — ABNORMAL HIGH (ref 65–99)
Glucose-Capillary: 64 mg/dL — ABNORMAL LOW (ref 65–99)
Glucose-Capillary: 129 mg/dL — ABNORMAL HIGH (ref 65–99)
Glucose-Capillary: 169 mg/dL — ABNORMAL HIGH (ref 65–99)

## 2016-02-08 DIAGNOSIS — E119 Type 2 diabetes mellitus without complications: Secondary | ICD-10-CM | POA: Diagnosis not present

## 2016-02-08 LAB — GLUCOSE, CAPILLARY
GLUCOSE-CAPILLARY: 157 mg/dL — AB (ref 65–99)
GLUCOSE-CAPILLARY: 62 mg/dL — AB (ref 65–99)
GLUCOSE-CAPILLARY: 95 mg/dL (ref 65–99)
GLUCOSE-CAPILLARY: 189 mg/dL — AB (ref 65–99)
GLUCOSE-CAPILLARY: 318 mg/dL — AB (ref 65–99)

## 2016-02-09 DIAGNOSIS — E119 Type 2 diabetes mellitus without complications: Secondary | ICD-10-CM | POA: Diagnosis not present

## 2016-02-09 LAB — GLUCOSE, CAPILLARY
GLUCOSE-CAPILLARY: 107 mg/dL — AB (ref 65–99)
GLUCOSE-CAPILLARY: 139 mg/dL — AB (ref 65–99)
GLUCOSE-CAPILLARY: 100 mg/dL — AB (ref 65–99)
GLUCOSE-CAPILLARY: 151 mg/dL — AB (ref 65–99)

## 2016-02-12 ENCOUNTER — Other Ambulatory Visit: Payer: Self-pay | Admitting: *Deleted

## 2016-02-12 ENCOUNTER — Ambulatory Visit
Admission: RE | Admit: 2016-02-12 | Discharge: 2016-02-12 | Disposition: A | Discharge status: Home / Self Care | Payer: Medicare Other | Source: Ambulatory Visit | Attending: Internal Medicine | Admitting: Internal Medicine

## 2016-02-12 DIAGNOSIS — C250 Malignant neoplasm of head of pancreas: Secondary | ICD-10-CM

## 2016-02-12 DIAGNOSIS — E119 Type 2 diabetes mellitus without complications: Secondary | ICD-10-CM | POA: Diagnosis not present

## 2016-02-12 LAB — CBC WITH DIFFERENTIAL/PLATELET
BASOS ABS: 0 10*3/uL (ref 0–0.1)
BASOS PCT: 1 %
EOS ABS: 0.2 10*3/uL (ref 0–0.7)
Eosinophils Relative: 3 %
HCT: 28 % — ABNORMAL LOW (ref 35.0–47.0)
HEMOGLOBIN: 9.3 g/dL — AB (ref 12.0–16.0)
Lymphocytes Relative: 21 %
Lymphs Abs: 1.5 10*3/uL (ref 1.0–3.6)
MCH: 31.4 pg (ref 26.0–34.0)
MCHC: 33.2 g/dL (ref 32.0–36.0)
MCV: 94.6 fL (ref 80.0–100.0)
Monocytes Absolute: 0.8 10*3/uL (ref 0.2–0.9)
Monocytes Relative: 12 %
NEUTROS ABS: 4.3 10*3/uL (ref 1.4–6.5)
NEUTROS PCT: 63 %
Platelets: 254 10*3/uL (ref 150–440)
RBC: 2.96 MIL/uL — AB (ref 3.80–5.20)
RDW: 16.5 % — ABNORMAL HIGH (ref 11.5–14.5)
WBC: 6.8 10*3/uL (ref 3.6–11.0)

## 2016-02-12 LAB — COMPREHENSIVE METABOLIC PANEL
ALBUMIN: 2.1 g/dL — AB (ref 3.5–5.0)
ALK PHOS: 169 U/L — AB (ref 38–126)
ALT: 19 U/L (ref 14–54)
ANION GAP: 6 (ref 5–15)
AST: 33 U/L (ref 15–41)
BUN: 26 mg/dL — AB (ref 6–20)
CO2: 25 mmol/L (ref 22–32)
Calcium: 8.1 mg/dL — ABNORMAL LOW (ref 8.9–10.3)
Chloride: 106 mmol/L (ref 101–111)
Creatinine, Ser: 2.68 mg/dL — ABNORMAL HIGH (ref 0.44–1.00)
GFR calc Af Amer: 18 mL/min — ABNORMAL LOW (ref >60)
GFR calc non Af Amer: 15 mL/min — ABNORMAL LOW (ref >60)
Glucose, Bld: 101 mg/dL — ABNORMAL HIGH (ref 65–99)
Potassium: 3.8 mmol/L (ref 3.5–5.1)
SODIUM: 137 mmol/L (ref 135–145)
TOTAL PROTEIN: 5.2 g/dL — AB (ref 6.5–8.1)
Total Bilirubin: 0.1 mg/dL — ABNORMAL LOW (ref 0.3–1.2)

## 2016-02-13 ENCOUNTER — Ambulatory Visit
Admission: RE | Admit: 2016-02-13 | Discharge: 2016-02-13 | Disposition: A | Discharge status: Home / Self Care | Payer: Medicare Other | Source: Ambulatory Visit | Attending: Internal Medicine | Admitting: Internal Medicine

## 2016-02-13 DIAGNOSIS — C250 Malignant neoplasm of head of pancreas: Secondary | ICD-10-CM | POA: Diagnosis present

## 2016-02-13 LAB — GLUCOSE, CAPILLARY: GLUCOSE-CAPILLARY: 126 mg/dL — AB (ref 65–99)

## 2016-02-13 MED ORDER — FLUDEOXYGLUCOSE F - 18 (FDG) INJECTION
13.2600 | Freq: Once | INTRAVENOUS | Status: AC | PRN
  Administered 2016-02-13: 13.26 via INTRAVENOUS

## 2016-02-14 ENCOUNTER — Inpatient Hospital Stay (HOSPITAL_BASED_OUTPATIENT_CLINIC_OR_DEPARTMENT_OTHER): Payer: Medicare Other | Admitting: Internal Medicine

## 2016-02-14 ENCOUNTER — Ambulatory Visit
Admission: RE | Admit: 2016-02-14 | Discharge: 2016-02-14 | Disposition: A | Discharge status: Home / Self Care | Payer: Medicare Other | Source: Ambulatory Visit | Attending: Radiation Oncology | Admitting: Radiation Oncology

## 2016-02-14 ENCOUNTER — Inpatient Hospital Stay: Payer: Medicare Other | Attending: Internal Medicine

## 2016-02-14 ENCOUNTER — Encounter: Payer: Self-pay | Admitting: Radiation Oncology

## 2016-02-14 VITALS — BP 134/55 | HR 65 | Temp 97.7°F | Resp 18 | Wt 221.1 lb

## 2016-02-14 DIAGNOSIS — Z79899 Other long term (current) drug therapy: Secondary | ICD-10-CM

## 2016-02-14 DIAGNOSIS — E119 Type 2 diabetes mellitus without complications: Secondary | ICD-10-CM

## 2016-02-14 DIAGNOSIS — C250 Malignant neoplasm of head of pancreas: Secondary | ICD-10-CM | POA: Insufficient documentation

## 2016-02-14 DIAGNOSIS — Z794 Long term (current) use of insulin: Secondary | ICD-10-CM | POA: Insufficient documentation

## 2016-02-14 DIAGNOSIS — K219 Gastro-esophageal reflux disease without esophagitis: Secondary | ICD-10-CM

## 2016-02-14 DIAGNOSIS — Z853 Personal history of malignant neoplasm of breast: Secondary | ICD-10-CM

## 2016-02-14 DIAGNOSIS — Z8542 Personal history of malignant neoplasm of other parts of uterus: Secondary | ICD-10-CM | POA: Insufficient documentation

## 2016-02-14 DIAGNOSIS — M109 Gout, unspecified: Secondary | ICD-10-CM | POA: Diagnosis not present

## 2016-02-14 DIAGNOSIS — E669 Obesity, unspecified: Secondary | ICD-10-CM | POA: Diagnosis not present

## 2016-02-14 DIAGNOSIS — I129 Hypertensive chronic kidney disease with stage 1 through stage 4 chronic kidney disease, or unspecified chronic kidney disease: Secondary | ICD-10-CM

## 2016-02-14 DIAGNOSIS — E079 Disorder of thyroid, unspecified: Secondary | ICD-10-CM

## 2016-02-14 DIAGNOSIS — Z7982 Long term (current) use of aspirin: Secondary | ICD-10-CM | POA: Insufficient documentation

## 2016-02-14 DIAGNOSIS — E785 Hyperlipidemia, unspecified: Secondary | ICD-10-CM | POA: Diagnosis not present

## 2016-02-14 DIAGNOSIS — Z9221 Personal history of antineoplastic chemotherapy: Secondary | ICD-10-CM | POA: Insufficient documentation

## 2016-02-14 DIAGNOSIS — N189 Chronic kidney disease, unspecified: Secondary | ICD-10-CM | POA: Insufficient documentation

## 2016-02-14 DIAGNOSIS — R7989 Other specified abnormal findings of blood chemistry: Secondary | ICD-10-CM | POA: Diagnosis not present

## 2016-02-14 LAB — CBC WITH DIFFERENTIAL/PLATELET
Basophils Absolute: 0 10*3/uL (ref 0–0.1)
Basophils Relative: 0 %
EOS ABS: 0.2 10*3/uL (ref 0–0.7)
EOS PCT: 4 %
HCT: 30.9 % — ABNORMAL LOW (ref 35.0–47.0)
Hemoglobin: 10.2 g/dL — ABNORMAL LOW (ref 12.0–16.0)
LYMPHS ABS: 1.1 10*3/uL (ref 1.0–3.6)
LYMPHS PCT: 18 %
MCH: 31 pg (ref 26.0–34.0)
MCHC: 32.9 g/dL (ref 32.0–36.0)
MCV: 94 fL (ref 80.0–100.0)
MONOS PCT: 10 %
Monocytes Absolute: 0.6 10*3/uL (ref 0.2–0.9)
Neutro Abs: 4.3 10*3/uL (ref 1.4–6.5)
Neutrophils Relative %: 68 %
PLATELETS: 302 10*3/uL (ref 150–440)
RBC: 3.29 MIL/uL — ABNORMAL LOW (ref 3.80–5.20)
RDW: 16.3 % — ABNORMAL HIGH (ref 11.5–14.5)
WBC: 6.3 10*3/uL (ref 3.6–11.0)

## 2016-02-14 LAB — COMPREHENSIVE METABOLIC PANEL
ALK PHOS: 189 U/L — AB (ref 38–126)
ALT: 21 U/L (ref 14–54)
ANION GAP: 8 (ref 5–15)
AST: 34 U/L (ref 15–41)
Albumin: 2.4 g/dL — ABNORMAL LOW (ref 3.5–5.0)
BUN: 26 mg/dL — ABNORMAL HIGH (ref 6–20)
CALCIUM: 8 mg/dL — AB (ref 8.9–10.3)
CHLORIDE: 103 mmol/L (ref 101–111)
CO2: 25 mmol/L (ref 22–32)
CREATININE: 2.24 mg/dL — AB (ref 0.44–1.00)
GFR, EST AFRICAN AMERICAN: 22 mL/min — AB (ref >60)
GFR, EST NON AFRICAN AMERICAN: 19 mL/min — AB (ref >60)
Glucose, Bld: 139 mg/dL — ABNORMAL HIGH (ref 65–99)
Potassium: 4 mmol/L (ref 3.5–5.1)
SODIUM: 136 mmol/L (ref 135–145)
Total Bilirubin: 0.4 mg/dL (ref 0.3–1.2)
Total Protein: 5.7 g/dL — ABNORMAL LOW (ref 6.5–8.1)

## 2016-02-14 NOTE — Progress Notes (Signed)
Radiation Oncology Follow up Note  Name: Pamela Preston   Date:   02/14/2016 MRN:  [id number] DOB: 28-Aug-1929    This 82 y.o. 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 Patient presented with scleral icterus she has multiple comorbidities including chronic renal failure adult-onset diabetes hypertension and morbid obesity. She is seen today in routine follow-up and is doing fairly well. She states her appetite is good no diarrhea. She has no evidence of scleral icterus or jaundice. Had a recent PET CT scan showing some increased hypermetabolic activity around her stent which may be secondary to the stent and inflammation..  COMPLICATIONS OF TREATMENT: none  FOLLOW UP COMPLIANCE: keeps appointments   PHYSICAL EXAM:  There were no vitals taken for this visit. Well-developed morbidly obese wheelchair-bound female in NAD. Abdomen is benign positive bowel sounds all 4 quadrants no pain is elicited on deep palpation of her abdomen. Well-developed well-nourished patient in NAD. HEENT reveals PERLA, EOMI, discs not visualized.  Oral cavity is clear. No oral mucosal lesions are identified. Neck is clear without evidence of cervical or supraclavicular adenopathy. Lungs are clear to A&P. Cardiac examination is essentially unremarkable with regular rate and rhythm without murmur rub or thrill. Abdomen is benign with no organomegaly or masses noted. Motor sensory and DTR levels are equal and symmetric in the upper and lower extremities. Cranial nerves II through XII are grossly intact. Proprioception is intact. No peripheral adenopathy or edema is identified. No motor or sensory levels are noted. Crude visual fields are  within normal range.  RADIOLOGY RESULTS: PET CT scan is reviewed and compatible with the above-stated findings  PLAN: At the present time she is doing well. I've reviewed her PET/CT scan. I believe this certainly may be related to her stent placement. Her bilirubin is fine. She continues close follow-up care with medical oncology. Based on her difficulty with transportation I'm to turn follow-up care over to medical oncology. Would be happy to reevaluate the patient any time should further rate further opinion from radiation oncology be indicated.  I would like to take this opportunity to thank you for allowing me to participate in the care of your patient.Armstead Peaks., MD

## 2016-02-15 LAB — CANCER ANTIGEN 19-9: CA 19 9: 3 U/mL (ref 0–35)

## 2016-02-15 NOTE — Progress Notes (Signed)
[place] OFFICE PROGRESS NOTE  Patient Care Team: Raylene Miyamoto, MD as PCP - General (Pulmonary Disease) [name], RN as Registered Nurse   SUMMARY OF ONCOLOGIC HISTORY: Oncology History   # FEB 2017- Carcinoma of head of pancreas; STAGE IB; T2N0 [EUS]- 23 mm millimeter by 17 mm invading superior MESENTERIC VEIN. No arterial invasion was found.  November 08, 2015- 5-FU by continuous infusion and radiation therapy [6 weeks]  # Cholangitis/sepsis [april 2017] s/p stent exchange [Ddr.Oh]  # Carcinoma of breast diagnosis was in August 25 2011. T1 cN0 M0 tumor ER/PR positive.  # CKD/ diabetes,/ obesity bilateral knee replacement and poor performance status     Primary malignant neoplasm of head of pancreas ([place])   10/11/2015 Initial Diagnosis Primary malignant neoplasm of head of pancreas ([facility])      INTERVAL HISTORY:  A very pleasant 82 year old female patient with above history of stage IB pancreatic cancer status post concurrent chemoradiation is here for follow-up/review the results of the PET scan  Currently patient denies any unusual shortness of breath or chest pain. Denies abdominal pain. Has chronic mild swelling in the legs not any worse. She continues to go around a walker. She is in a rehabilitation. Working with physical therapy.   No fever no chills. No nausea no vomiting.  REVIEW OF SYSTEMS:  A complete 10 point review of system is done which is negative except mentioned above/history of present illness.   PAST MEDICAL HISTORY :  Past Medical History  Diagnosis Date  . Hypertension   . GERD (gastroesophageal reflux disease)   . Diabetes mellitus without complication ([place])   . Breast cancer ([place]) 2013    c Lumpectomy 2013  . Uterine cancer ([place]) 1960  . Pancreatic cancer ([place])   . Abnormal LFTs   . Gout   . Thyroid disease   . Hyperlipidemia     PAST SURGICAL HISTORY :   Past Surgical History  Procedure Laterality Date  .  Abdominal hysterectomy    . Knee arthroscopy    . Ercp N/A 09/04/2015    Procedure: ENDOSCOPIC RETROGRADE CHOLANGIOPANCREATOGRAPHY (ERCP);  Surgeon: Hulen Luster, MD;  Location: [facility] ENDOSCOPY;  Service: Gastroenterology;  Laterality: N/A;  . Breast biopsy Right 2012    Positive  . Upper esophageal endoscopic ultrasound (eus) N/A 10/04/2015    Procedure: UPPER ESOPHAGEAL ENDOSCOPIC ULTRASOUND (EUS);  Surgeon: Holly Bodily, MD;  Location: [facility] ENDOSCOPY;  Service: Gastroenterology;  Laterality: N/A;  . Peripheral vascular catheterization N/A 11/05/2015    Procedure: Porta Cath Insertion;  Surgeon: Algernon Huxley, MD;  Location: [place] CV LAB;  Service: Cardiovascular;  Laterality: N/A;  . Ercp N/A 11/22/2015    Procedure: ENDOSCOPIC RETROGRADE CHOLANGIOPANCREATOGRAPHY (ERCP);  Surgeon: Hulen Luster, MD;  Location: [facility] ENDOSCOPY;  Service: Endoscopy;  Laterality: N/A;  For Thursday afternoon    FAMILY HISTORY :   Family History  Problem Relation Age of Onset  . Cancer Mother   . Heart disease Brother     SOCIAL HISTORY:   Social History  Substance Use Topics  . Smoking status: Never Smoker   . Smokeless tobacco: Not on file  . Alcohol Use: No    ALLERGIES:  is allergic to colchicine; ketorolac; lactase; and lipitor.  MEDICATIONS:  Current Outpatient Prescriptions  Medication Sig Dispense Refill  . amLODipine (NORVASC) 5 MG tablet Take 1 tablet (5 mg total) by mouth daily. 30 tablet 0  . amoxicillin-clavulanate (AUGMENTIN) 875-125 MG  tablet Take 1 tablet by mouth 2 (two) times daily. 28 tablet 0  . aspirin EC 81 MG tablet Take 81 mg by mouth daily.    [name] buPROPion (WELLBUTRIN SR) 150 MG 12 hr tablet Take 150 mg by mouth 2 (two) times daily. Reported on 12/06/2015    . Febuxostat (ULORIC) 80 MG TABS Take 80 mg by mouth daily.    [name] glipiZIDE (GLUCOTROL) 5 MG tablet Take 5 mg by mouth daily before breakfast.     . HYDROcodone-acetaminophen (NORCO/VICODIN) 5-325 MG tablet Take 1  tablet by mouth every 6 (six) hours as needed for moderate pain. 20 tablet 0  . insulin detemir (LEVEMIR) 100 UNIT/ML injection Inject 12-16 Units into the skin See admin instructions. Inject 16 units subcutaneous every morning at 7 am, and Inject 12 units subcutaneous every evening at 6 pm.    . letrozole (FEMARA) 2.5 MG tablet Take 2.5 mg by mouth daily.    [name] levothyroxine (SYNTHROID, LEVOTHROID) 75 MCG tablet Take 75 mcg by mouth daily before breakfast.    . lidocaine (LMX) 4 % cream Apply 1 application topically See admin instructions. Apply lidocaine with tegaderm to port a cath area prior to chemo appointment once a day on Monday, Tuesday, Wednesday, Thursday, and Friday only.    [name] lisinopril (PRINIVIL,ZESTRIL) 20 MG tablet Take 20 mg by mouth daily.    [name] loperamide (IMODIUM) 2 MG capsule Take 1 capsule by mouth every 6 hours as needed for diarrhea.  0  . metoprolol succinate (TOPROL XL) 25 MG 24 hr tablet Take 1 tablet (25 mg total) by mouth daily. 30 tablet 0  . omeprazole (PRILOSEC) 20 MG capsule Take 20 mg by mouth daily.    . ondansetron (ZOFRAN) 4 MG tablet Take 4 mg by mouth 3 (three) times daily.    [name] POLYETHYLENE GLYCOL 3350 PO Take 17 g by mouth daily as needed (for constipation). *Mix in 4-8 oz liquid of choice*    . potassium chloride 20 MEQ TBCR Take 20 mEq by mouth 2 (two) times daily. 30 tablet 0  . pravastatin (PRAVACHOL) 40 MG tablet Take 40 mg by mouth daily.    . promethazine (PHENERGAN) 25 MG tablet Take 25 mg by mouth every 6 (six) hours.   1   No current facility-administered medications for this visit.   Facility-Administered Medications Ordered in Other Visits  Medication Dose Route Frequency Provider Last Rate Last Dose  . 0.9 %  sodium chloride infusion   Intravenous Continuous Forest Gleason, MD   Stopped at 11/08/15 1144  . heparin lock flush 100 unit/mL  500 Units Intracatheter Once PRN Forest Gleason, MD      . heparin lock flush 100 unit/mL  500 Units Intravenous  Once Forest Gleason, MD      . sodium chloride flush (NS) 0.9 % injection 10 mL  10 mL Intracatheter PRN Forest Gleason, MD   10 mL at 11/08/15 1052  . sodium chloride flush (NS) 0.9 % injection 10 mL  10 mL Intravenous PRN Forest Gleason, MD        PHYSICAL EXAMINATION: ECOG PERFORMANCE STATUS: 3 - Symptomatic, >50% confined to bed  BP 134/55 mmHg  Pulse 65  Temp(Src) 97.7 F (36.5 C) (Tympanic)  Resp 18  Wt 221 lb 1 oz (100.273 kg)  Filed Weights   02/14/16 1036  Weight: 221 lb 1 oz (100.273 kg)    GENERAL: Well-nourished well-developed; Obese Alert, no distress and comfortable.   With her sister;  in a wheel chair. Accompanied by family. EYES: no pallor or icterus OROPHARYNX: no thrush or ulceration; good dentition  NECK: supple, no masses felt LYMPH:  no palpable lymphadenopathy in the cervical, axillary or inguinal regions LUNGS: clear to auscultation and  No wheeze or crackles HEART/CVS: regular rate & rhythm and no murmurs; positive for bil Lower extremity edema ABDOMEN:abdomen soft, non-tender and normal bowel sounds Musculoskeletal:no cyanosis of digits and no clubbing  PSYCH: alert & oriented x 3 with fluent speech NEURO: no focal motor/sensory deficits SKIN:  no rashes or significant lesions  LABORATORY DATA:  I have reviewed the data as listed    Component Value Date/Time   NA 136 02/14/2016 1000   NA 138 11/19/2013 0822   K 4.0 02/14/2016 1000   K 3.8 11/19/2013 0822   CL 103 02/14/2016 1000   CL 103 11/19/2013 0822   CO2 25 02/14/2016 1000   CO2 30 11/19/2013 0822   GLUCOSE 139* 02/14/2016 1000   GLUCOSE 126* 11/19/2013 0822   BUN 26* 02/14/2016 1000   BUN 18 11/19/2013 0822   CREATININE 2.24* 02/14/2016 1000   CREATININE 2.31* 11/19/2013 0822   CALCIUM 8.0* 02/14/2016 1000   CALCIUM 8.7 11/19/2013 0822   PROT 5.7* 02/14/2016 1000   PROT 7.4 11/19/2013 0822   ALBUMIN 2.4* 02/14/2016 1000   ALBUMIN 3.3* 11/19/2013 0822   AST 34 02/14/2016 1000   AST 16  11/19/2013 0822   ALT 21 02/14/2016 1000   ALT 13 11/19/2013 0822   ALKPHOS 189* 02/14/2016 1000   ALKPHOS 94 11/19/2013 0822   BILITOT 0.4 02/14/2016 1000   BILITOT 0.4 11/19/2013 0822   GFRNONAA 19* 02/14/2016 1000   GFRNONAA 19* 11/19/2013 0822   GFRAA 22* 02/14/2016 1000   GFRAA 22* 11/19/2013 0822    No results found for: SPEP, UPEP  Lab Results  Component Value Date   WBC 6.3 02/14/2016   NEUTROABS 4.3 02/14/2016   HGB 10.2* 02/14/2016   HCT 30.9* 02/14/2016   MCV 94.0 02/14/2016   PLT 302 02/14/2016      Chemistry      Component Value Date/Time   NA 136 02/14/2016 1000   NA 138 11/19/2013 0822   K 4.0 02/14/2016 1000   K 3.8 11/19/2013 0822   CL 103 02/14/2016 1000   CL 103 11/19/2013 0822   CO2 25 02/14/2016 1000   CO2 30 11/19/2013 0822   BUN 26* 02/14/2016 1000   BUN 18 11/19/2013 0822   CREATININE 2.24* 02/14/2016 1000   CREATININE 2.31* 11/19/2013 0822      Component Value Date/Time   CALCIUM 8.0* 02/14/2016 1000   CALCIUM 8.7 11/19/2013 0822   ALKPHOS 189* 02/14/2016 1000   ALKPHOS 94 11/19/2013 0822   AST 34 02/14/2016 1000   AST 16 11/19/2013 0822   ALT 21 02/14/2016 1000   ALT 13 11/19/2013 0822   BILITOT 0.4 02/14/2016 1000   BILITOT 0.4 11/19/2013 0822      ASSESSMENT & PLAN:   Primary malignant neoplasm of head of pancreas ([place]) # PANCREATIC Adenocarcinoma- T2N0-STAGE IB currently on concurrent chemoradiation with 5-FU. S/p chemo-RT; repeat PET scan shows- uptake around the pancreatic head area- unclear whether this is residual neoplasm versus chemoradiation induced reactive changes/biliary stent.   # Patient is a poor candidate for any aggressive therapies at this time; # Patient also poor candidate for any further adjuvant therapy like gemcitabine at this time. Recommendation is to monitor the patient with a follow-up PET  scan in approximately 3 months.    # Chronic kidney disease-stable.   I reviewed the images myself and with the  patient and family in detail. A copy of this report was given. I also discussed the above plan with Dr. Donella Stade.   # Follow with me with CBC CMP CA-19-9/PET scan in 3 months        Cammie Sickle, MD 02/15/2016 7:29 AM

## 2016-02-15 NOTE — Assessment & Plan Note (Signed)
#   PANCREATIC Adenocarcinoma- T2N0-STAGE IB currently on concurrent chemoradiation with 5-FU. S/p chemo-RT; repeat PET scan shows- uptake around the pancreatic head area- unclear whether this is residual neoplasm versus chemoradiation induced reactive changes/biliary stent.   # Patient is a poor candidate for any aggressive therapies at this time; # Patient also poor candidate for any further adjuvant therapy like gemcitabine at this time. Recommendation is to monitor the patient with a follow-up PET scan in approximately 3 months.    # Chronic kidney disease-stable.   I reviewed the images myself and with the patient and family in detail. A copy of this report was given. I also discussed the above plan with Dr. Donella Stade.   # Follow with me with CBC CMP CA-19-9/PET scan in 3 months

## 2016-02-16 ENCOUNTER — Encounter
Admission: RE | Admit: 2016-02-16 | Discharge: 2016-02-16 | Disposition: A | Discharge status: Home / Self Care | Payer: Medicare Other | Source: Ambulatory Visit | Attending: Internal Medicine | Admitting: Internal Medicine

## 2016-02-16 DIAGNOSIS — R410 Disorientation, unspecified: Secondary | ICD-10-CM | POA: Diagnosis not present

## 2016-02-16 DIAGNOSIS — R4781 Slurred speech: Secondary | ICD-10-CM | POA: Diagnosis not present

## 2016-02-16 LAB — GLUCOSE, CAPILLARY
Glucose-Capillary: 136 mg/dL — ABNORMAL HIGH (ref 65–99)
Glucose-Capillary: 200 mg/dL — ABNORMAL HIGH (ref 65–99)
Glucose-Capillary: 166 mg/dL — ABNORMAL HIGH (ref 65–99)

## 2016-02-18 ENCOUNTER — Ambulatory Visit
Admission: RE | Admit: 2016-02-18 | Discharge: 2016-02-18 | Disposition: A | Discharge status: Home / Self Care | Payer: Medicare Other | Source: Ambulatory Visit | Attending: Gerontology | Admitting: Gerontology

## 2016-02-18 ENCOUNTER — Other Ambulatory Visit: Payer: Self-pay | Admitting: Gerontology

## 2016-02-18 DIAGNOSIS — G319 Degenerative disease of nervous system, unspecified: Secondary | ICD-10-CM | POA: Diagnosis not present

## 2016-02-18 DIAGNOSIS — R410 Disorientation, unspecified: Secondary | ICD-10-CM

## 2016-02-18 DIAGNOSIS — R4781 Slurred speech: Secondary | ICD-10-CM | POA: Diagnosis present

## 2016-02-18 DIAGNOSIS — Z8673 Personal history of transient ischemic attack (TIA), and cerebral infarction without residual deficits: Secondary | ICD-10-CM | POA: Insufficient documentation

## 2016-02-19 ENCOUNTER — Non-Acute Institutional Stay (SKILLED_NURSING_FACILITY): Payer: Medicare Other | Admitting: Gerontology

## 2016-02-19 DIAGNOSIS — R410 Disorientation, unspecified: Secondary | ICD-10-CM | POA: Diagnosis not present

## 2016-02-19 DIAGNOSIS — E162 Hypoglycemia, unspecified: Secondary | ICD-10-CM | POA: Diagnosis not present

## 2016-02-19 DIAGNOSIS — G459 Transient cerebral ischemic attack, unspecified: Secondary | ICD-10-CM

## 2016-02-19 LAB — URINALYSIS COMPLETE WITH MICROSCOPIC (ARMC ONLY)
BILIRUBIN URINE: NEGATIVE
Bacteria, UA: NONE SEEN
Glucose, UA: NEGATIVE mg/dL
Hgb urine dipstick: NEGATIVE
KETONES UR: NEGATIVE mg/dL
Leukocytes, UA: NEGATIVE
Nitrite: NEGATIVE
Protein, ur: NEGATIVE mg/dL
RBC / HPF: NONE SEEN RBC/hpf (ref 0–5)
Specific Gravity, Urine: 1.006 (ref 1.005–1.030)
pH: 5 (ref 5.0–8.0)

## 2016-02-19 LAB — BASIC METABOLIC PANEL
ANION GAP: 7 (ref 5–15)
BUN: 23 mg/dL — ABNORMAL HIGH (ref 6–20)
CALCIUM: 8 mg/dL — AB (ref 8.9–10.3)
CHLORIDE: 103 mmol/L (ref 101–111)
CO2: 27 mmol/L (ref 22–32)
Creatinine, Ser: 2.31 mg/dL — ABNORMAL HIGH (ref 0.44–1.00)
GFR calc Af Amer: 21 mL/min — ABNORMAL LOW (ref >60)
GFR calc non Af Amer: 18 mL/min — ABNORMAL LOW (ref >60)
GLUCOSE: 245 mg/dL — AB (ref 65–99)
Potassium: 4.2 mmol/L (ref 3.5–5.1)
Sodium: 137 mmol/L (ref 135–145)

## 2016-02-19 LAB — CBC WITH DIFFERENTIAL/PLATELET
BASOS ABS: 0 10*3/uL (ref 0–0.1)
Basophils Relative: 1 %
Eosinophils Absolute: 0.2 10*3/uL (ref 0–0.7)
Eosinophils Relative: 4 %
HEMATOCRIT: 28.6 % — AB (ref 35.0–47.0)
HEMOGLOBIN: 9.6 g/dL — AB (ref 12.0–16.0)
LYMPHS PCT: 24 %
Lymphs Abs: 1.3 10*3/uL (ref 1.0–3.6)
MCH: 31.5 pg (ref 26.0–34.0)
MCHC: 33.4 g/dL (ref 32.0–36.0)
MCV: 94.2 fL (ref 80.0–100.0)
MONO ABS: 0.6 10*3/uL (ref 0.2–0.9)
MONOS PCT: 12 %
NEUTROS ABS: 3 10*3/uL (ref 1.4–6.5)
NEUTROS PCT: 59 %
Platelets: 262 10*3/uL (ref 150–440)
RBC: 3.04 MIL/uL — ABNORMAL LOW (ref 3.80–5.20)
RDW: 16.4 % — AB (ref 11.5–14.5)
WBC: 5.1 10*3/uL (ref 3.6–11.0)

## 2016-02-19 LAB — MAGNESIUM: Magnesium: 1.6 mg/dL — ABNORMAL LOW (ref 1.7–2.4)

## 2016-02-19 NOTE — Progress Notes (Signed)
Location:  [facility] Service:  SNF [phone]) Provider:  Toni Arthurs, NP-C  Raylene Miyamoto., MD  Patient Care Team: Raylene Miyamoto, MD as PCP - General (Pulmonary Disease) [name], RN as Registered Nurse  Extended Emergency Contact Information Primary Emergency Contact: Leeanne Deed [place] Phone: [phone] Mobile Phone: [phone] Relation: Sister  Code Status:  Full Goals of care: Advanced Directive information Advanced Directives 02/14/2016  Does patient have an advance directive? Yes  Type of Advance Directive -  Does patient want to make changes to advanced directive? -  Copy of advanced directive(s) in chart? -     Chief Complaint  Patient presents with  . Facial Droop    HPI:  Pt is a 82 y.o. female seen today for an acute visit for Right sided facial droop, right sided weakness and slurred speech. Pt had an episode of hypoglycemia during the night and was given glucagon. Symptoms of hypoglycemia continued through the morning and therefore, current symptoms unclear of onset, vs hypoglycemic symptoms. Sister reports a h/o of TIA (in herself.) Sister, patient and staff report improvement of symptoms since initially noticed that morning.    Past Medical History  Diagnosis Date  . Hypertension   . GERD (gastroesophageal reflux disease)   . Diabetes mellitus without complication ([place])   . Breast cancer ([place]) 2013    c Lumpectomy 2013  . Uterine cancer ([place]) 1960  . Pancreatic cancer ([place])   . Abnormal LFTs   . Gout   . Thyroid disease   . Hyperlipidemia    Past Surgical History  Procedure Laterality Date  . Abdominal hysterectomy    . Knee arthroscopy    . Ercp N/A 09/04/2015    Procedure: ENDOSCOPIC RETROGRADE CHOLANGIOPANCREATOGRAPHY (ERCP);  Surgeon: Hulen Luster, MD;  Location: [facility] ENDOSCOPY;  Service: Gastroenterology;  Laterality: N/A;  . Breast biopsy Right 2012    Positive  . Upper  esophageal endoscopic ultrasound (eus) N/A 10/04/2015    Procedure: UPPER ESOPHAGEAL ENDOSCOPIC ULTRASOUND (EUS);  Surgeon: Holly Bodily, MD;  Location: [facility] ENDOSCOPY;  Service: Gastroenterology;  Laterality: N/A;  . Peripheral vascular catheterization N/A 11/05/2015    Procedure: Porta Cath Insertion;  Surgeon: Algernon Huxley, MD;  Location: [facility] CV LAB;  Service: Cardiovascular;  Laterality: N/A;  . Ercp N/A 11/22/2015    Procedure: ENDOSCOPIC RETROGRADE CHOLANGIOPANCREATOGRAPHY (ERCP);  Surgeon: Hulen Luster, MD;  Location: [facility] ENDOSCOPY;  Service: Endoscopy;  Laterality: N/A;  For Thursday afternoon    Allergies  Allergen Reactions  . Colchicine Other (See Comments)    Unknown reaction  . Ketorolac Other (See Comments)    Unknown reaction  . Lactase Diarrhea  . Lipitor [Atorvastatin] Other (See Comments)    Unknown reaction      Medication List       This list is accurate as of: 02/19/16  7:11 PM.  Always use your most recent med list.               amLODipine 5 MG tablet  Commonly known as:  NORVASC  Take 1 tablet (5 mg total) by mouth daily.     amoxicillin-clavulanate 875-125 MG tablet  Commonly known as:  AUGMENTIN  Take 1 tablet by mouth 2 (two) times daily.     aspirin EC 81 MG tablet  Take 81 mg by mouth daily.     buPROPion 150 MG 12 hr tablet  Commonly known as:  WELLBUTRIN SR  Take 150 mg by mouth 2 (two) times daily. Reported on 12/06/2015     glipiZIDE 5 MG tablet  Commonly known as:  GLUCOTROL  Take 5 mg by mouth daily before breakfast.     HYDROcodone-acetaminophen 5-325 MG tablet  Commonly known as:  NORCO/VICODIN  Take 1 tablet by mouth every 6 (six) hours as needed for moderate pain.     letrozole 2.5 MG tablet  Commonly known as:  FEMARA  Take 2.5 mg by mouth daily.     LEVEMIR 100 UNIT/ML injection  Generic drug:  insulin detemir  Inject 12-16 Units into the skin See admin instructions. Inject 16 units subcutaneous every morning at  7 am, and Inject 12 units subcutaneous every evening at 6 pm.     levothyroxine 75 MCG tablet  Commonly known as:  SYNTHROID, LEVOTHROID  Take 75 mcg by mouth daily before breakfast.     lidocaine 4 % cream  Commonly known as:  LMX  Apply 1 application topically See admin instructions. Apply lidocaine with tegaderm to port a cath area prior to chemo appointment once a day on Monday, Tuesday, Wednesday, Thursday, and Friday only.     lisinopril 20 MG tablet  Commonly known as:  PRINIVIL,ZESTRIL  Take 20 mg by mouth daily.     loperamide 2 MG capsule  Commonly known as:  IMODIUM  Take 1 capsule by mouth every 6 hours as needed for diarrhea.     metoprolol succinate 25 MG 24 hr tablet  Commonly known as:  TOPROL XL  Take 1 tablet (25 mg total) by mouth daily.     omeprazole 20 MG capsule  Commonly known as:  PRILOSEC  Take 20 mg by mouth daily.     ondansetron 4 MG tablet  Commonly known as:  ZOFRAN  Take 4 mg by mouth 3 (three) times daily.     POLYETHYLENE GLYCOL 3350 PO  Take 17 g by mouth daily as needed (for constipation). *Mix in 4-8 oz liquid of choice*     Potassium Chloride ER 20 MEQ Tbcr  Take 20 mEq by mouth 2 (two) times daily.     pravastatin 40 MG tablet  Commonly known as:  PRAVACHOL  Take 40 mg by mouth daily.     promethazine 25 MG tablet  Commonly known as:  PHENERGAN  Take 25 mg by mouth every 6 (six) hours.     ULORIC 80 MG Tabs  Generic drug:  Febuxostat  Take 80 mg by mouth daily.        Review of Systems  HENT: Negative.   Eyes: Negative.   Respiratory: Negative.   Cardiovascular: Negative.   Musculoskeletal: Negative.   Skin: Negative.   Neurological: Positive for facial asymmetry, speech difficulty and weakness. Negative for dizziness, tremors, seizures, syncope, light-headedness, numbness and headaches.  Psychiatric/Behavioral: Negative.      There is no immunization history on file for this patient. Pertinent  Health Maintenance  Due  Topic Date Due  . FOOT EXAM  11/29/1939  . OPHTHALMOLOGY EXAM  11/29/1939  . PNA vac Low Risk Adult (1 of 2 - PCV13) 11/29/1996  . INFLUENZA VACCINE  03/18/2016  . HEMOGLOBIN A1C  05/21/2016  . DEXA SCAN  Completed   No flowsheet data found. Functional Status Survey:    There were no vitals filed for this visit. There is no weight on file to calculate BMI. Physical Exam  Constitutional: She is oriented to person, place, and time.  Vital signs are normal. She appears well-developed and well-nourished. She appears ill.  HENT:  Head: Normocephalic and atraumatic.  Mouth/Throat: Oropharynx is clear and moist and mucous membranes are normal.  Voice hoarse, whisper  Eyes: Conjunctivae and EOM are normal. Pupils are equal, round, and reactive to light. Lids are everted and swept, no foreign bodies found. Right eye exhibits no discharge. Left eye exhibits no discharge.  Neck: Trachea normal and normal range of motion. No JVD present. No tracheal deviation present.  Cardiovascular: Normal rate, regular rhythm, normal heart sounds and intact distal pulses.  Exam reveals no gallop, no distant heart sounds, no friction rub and no decreased pulses.   No murmur heard. Pulmonary/Chest: Effort normal and breath sounds normal. No respiratory distress. She has no decreased breath sounds. She has no wheezes. She has no rhonchi. She has no rales. She exhibits no tenderness.  Abdominal: Soft. Normal appearance and bowel sounds are normal. There is generalized tenderness.  Musculoskeletal: She exhibits tenderness.       Right shoulder: She exhibits decreased range of motion, tenderness, pain and decreased strength. She exhibits no bony tenderness, no swelling, no effusion, no crepitus and no deformity.  Slight weakness in Right hand grip. Equal push/ pull of BUE, Equal plantar/dorsi-flexion of BLE. Equal SLR  Neurological: She is alert and oriented to person, place, and time. A cranial nerve deficit and  sensory deficit (abnormal distinction between sharp/ dull on face, neck, arm) is present. Coordination abnormal.  Skin: Skin is warm, dry and intact. No cyanosis. Nails show no clubbing.    Labs reviewed:  Recent Labs  10/16/15 1459 11/08/15 0916 11/15/15 1158  01/12/16 1200 02/12/16 1230 02/14/16 1000  NA 133* 138 137  < > 144 137 136  K 4.1 3.4* 3.2*  < > 4.4 3.8 4.0  CL 104 104 104  < > 119* 106 103  CO2 24 27 25   < > 18* 25 25  GLUCOSE 153* 63* 88  < > 69 101* 139*  BUN 23* 20 23*  < > 22* 26* 26*  CREATININE 2.27* 1.91* 1.90*  < > 1.54* 2.68* 2.24*  CALCIUM 8.8* 8.7* 8.6*  < > 8.1* 8.1* 8.0*  MG 1.7 1.8 1.7  --   --   --   --   < > = values in this interval not displayed.  Recent Labs  01/12/16 1200 02/12/16 1230 02/14/16 1000  AST 26 33 34  ALT 20 19 21   ALKPHOS 144* 169* 189*  BILITOT 0.3 0.1* 0.4  PROT 5.4* 5.2* 5.7*  ALBUMIN 2.1* 2.1* 2.4*    Recent Labs  01/12/16 1200 02/12/16 1230 02/14/16 1000  WBC 8.7 6.8 6.3  NEUTROABS 7.1* 4.3 4.3  HGB 8.6* 9.3* 10.2*  HCT 27.5* 28.0* 30.9*  MCV 97.7 94.6 94.0  PLT 226 254 302   Lab Results  Component Value Date   TSH 3.401 01/06/2016   Lab Results  Component Value Date   HGBA1C 6.2* 11/20/2015   No results found for: CHOL, HDL, LDLCALC, LDLDIRECT, TRIG, CHOLHDL  Significant Diagnostic Results in last 30 days:  Ct Head Wo Contrast  02/18/2016  CLINICAL DATA:  Slurred speech, right arm weakness beginning today. EXAM: CT HEAD WITHOUT CONTRAST TECHNIQUE: Contiguous axial images were obtained from the base of the skull through the vertex without intravenous contrast. COMPARISON:  MRI 02/22/2010 FINDINGS: There is atrophy and chronic small vessel disease changes. Old lacunar infarcts in the left caudate head and upper right periventricular  white matter. No acute intracranial abnormality. Specifically, no hemorrhage, hydrocephalus, mass lesion, acute infarction, or significant intracranial injury. No acute  calvarial abnormality. Visualized paranasal sinuses and mastoids clear. Orbital soft tissues unremarkable. IMPRESSION: No acute intracranial abnormality. Atrophy, chronic microvascular disease. Old bilateral lacunar infarcts as above. These results will be called to the ordering clinician or representative by the Radiologist Assistant, and communication documented in the PACS or zVision Dashboard. Electronically Signed   By: Rolm Baptise M.D.   On: 02/18/2016 15:28   Nm Pet Image Restag (ps) Skull Base To Thigh  02/13/2016  CLINICAL DATA:  Subsequent treatment strategy for pancreatic cancer. Radiation and chemotherapy 3 days prior. Renal failure and sepsis. EXAM: NUCLEAR MEDICINE PET SKULL BASE TO THIGH TECHNIQUE: 13.3 mCi F-18 FDG was injected intravenously. Full-ring PET imaging was performed from the skull base to thigh after the radiotracer. CT data was obtained and used for attenuation correction and anatomic localization. FASTING BLOOD GLUCOSE:  Value: 126 mg/dl COMPARISON:  CT 01/05/2016, 11/19/2015 FINDINGS: NECK No hypermetabolic lymph nodes in the neck. CHEST No hypermetabolic mediastinal or hilar nodes. No suspicious pulmonary nodules on the CT scan. ABDOMEN/PELVIS There is metabolic activity in the pancreatic head which is moderate (SUV max equal 5.4). No clear focal lesion is present. Biliary stent in place. No focal metabolic lesion within the liver identified. No hypermetabolic periportal or peripancreatic lymph nodes present. New pneumobilia within the liver and gallbladder. No hypermetabolic pelvic lymph nodes. Diffuse metabolic activity the bowel is physiologic. SKELETON No focal hypermetabolic activity to suggest skeletal metastasis. IMPRESSION: 1. Metabolic activity through the pancreatic head. Cannot exclude residual metabolic carcinoma but findings could relate to inflammation from biliary stent. 2. No liver metastasis. 3. No nodal metastasis identified. Electronically Signed   By: Suzy Bouchard M.D.   On: 02/13/2016 12:43    Assessment/Plan 1. Transient cerebral ischemia, unspecified transient cerebral ischemia type  Continue ASA 81 mg already ordered  Continue Pravastatin, already ordered  Considering co-morbidity of Pancreatic Ca, will defer ordering of Plavix, etc to oncology- pt/ family agreeable. 2. Hypoglycemia  Decrease Levemir to 8 units BID  Monitor FSBS Ac, HS  Family/ staff Communication:   Total Time: 60 minutes  Documentation: 30 minutes  Face to Face: 30 minutes  Family/Phone: sister present, discussed plan, pt and sister agreeable   Labs/tests ordered:  CT brain without contrast (results: negative for acute CVA), cbc, met c, mag, vit B12, UA, C&S  Vikki Ports, NP-C [facility] [address], [place] [address] Cell Phone (Mon-Fri 8am-5pm):  [phone] On Call:  [phone] & follow prompts after 5pm & weekends Office Phone:  [phone] Office Fax:  [phone]

## 2016-02-20 LAB — VITAMIN B12: Vitamin B-12: 340 pg/mL (ref 180–914)

## 2016-02-21 LAB — URINE CULTURE

## 2016-02-22 ENCOUNTER — Ambulatory Visit
Admission: RE | Admit: 2016-02-22 | Discharge: 2016-02-22 | Disposition: A | Discharge status: Home / Self Care | Payer: Medicare Other | Source: Ambulatory Visit | Attending: Gerontology | Admitting: Gerontology

## 2016-02-22 ENCOUNTER — Other Ambulatory Visit: Payer: Self-pay | Admitting: Gerontology

## 2016-02-22 DIAGNOSIS — R29810 Facial weakness: Secondary | ICD-10-CM | POA: Diagnosis present

## 2016-02-22 DIAGNOSIS — I6782 Cerebral ischemia: Secondary | ICD-10-CM | POA: Diagnosis not present

## 2016-02-22 DIAGNOSIS — G319 Degenerative disease of nervous system, unspecified: Secondary | ICD-10-CM | POA: Insufficient documentation

## 2016-02-22 DIAGNOSIS — I639 Cerebral infarction, unspecified: Secondary | ICD-10-CM | POA: Insufficient documentation

## 2016-02-22 DIAGNOSIS — M6289 Other specified disorders of muscle: Secondary | ICD-10-CM | POA: Diagnosis present

## 2016-02-22 DIAGNOSIS — R531 Weakness: Secondary | ICD-10-CM

## 2016-03-05 ENCOUNTER — Other Ambulatory Visit: Payer: Self-pay | Admitting: Internal Medicine

## 2016-03-05 DIAGNOSIS — R1312 Dysphagia, oropharyngeal phase: Secondary | ICD-10-CM

## 2016-03-15 DIAGNOSIS — R410 Disorientation, unspecified: Secondary | ICD-10-CM | POA: Diagnosis not present

## 2016-03-15 LAB — GLUCOSE, CAPILLARY
GLUCOSE-CAPILLARY: 63 mg/dL — AB (ref 65–99)
Glucose-Capillary: 39 mg/dL — CL (ref 65–99)
Glucose-Capillary: 79 mg/dL (ref 65–99)
Glucose-Capillary: 212 mg/dL — ABNORMAL HIGH (ref 65–99)
Glucose-Capillary: 232 mg/dL — ABNORMAL HIGH (ref 65–99)

## 2016-03-16 DIAGNOSIS — R410 Disorientation, unspecified: Secondary | ICD-10-CM | POA: Diagnosis not present

## 2016-03-16 LAB — GLUCOSE, CAPILLARY
GLUCOSE-CAPILLARY: 24 mg/dL — AB (ref 65–99)
GLUCOSE-CAPILLARY: 67 mg/dL (ref 65–99)
GLUCOSE-CAPILLARY: 151 mg/dL — AB (ref 65–99)
GLUCOSE-CAPILLARY: 173 mg/dL — AB (ref 65–99)
Glucose-Capillary: 243 mg/dL — ABNORMAL HIGH (ref 65–99)

## 2016-03-17 DIAGNOSIS — R410 Disorientation, unspecified: Secondary | ICD-10-CM | POA: Diagnosis not present

## 2016-03-17 LAB — GLUCOSE, CAPILLARY
GLUCOSE-CAPILLARY: 132 mg/dL — AB (ref 65–99)
GLUCOSE-CAPILLARY: 219 mg/dL — AB (ref 65–99)
GLUCOSE-CAPILLARY: 204 mg/dL — AB (ref 65–99)
Glucose-Capillary: 123 mg/dL — ABNORMAL HIGH (ref 65–99)

## 2016-03-18 ENCOUNTER — Ambulatory Visit: Payer: Medicare Other

## 2016-03-18 ENCOUNTER — Encounter
Admission: RE | Admit: 2016-03-18 | Discharge: 2016-03-18 | Disposition: A | Discharge status: Home / Self Care | Payer: Medicare Other | Source: Ambulatory Visit | Attending: Internal Medicine | Admitting: Internal Medicine

## 2016-03-18 DIAGNOSIS — E119 Type 2 diabetes mellitus without complications: Secondary | ICD-10-CM | POA: Insufficient documentation

## 2016-03-18 LAB — GLUCOSE, CAPILLARY
GLUCOSE-CAPILLARY: 141 mg/dL — AB (ref 65–99)
Glucose-Capillary: 196 mg/dL — ABNORMAL HIGH (ref 65–99)
Glucose-Capillary: 225 mg/dL — ABNORMAL HIGH (ref 65–99)

## 2016-03-19 ENCOUNTER — Ambulatory Visit
Admission: RE | Admit: 2016-03-19 | Discharge: 2016-03-19 | Disposition: A | Discharge status: Home / Self Care | Payer: Medicare Other | Source: Ambulatory Visit | Attending: Internal Medicine | Admitting: Internal Medicine

## 2016-03-19 DIAGNOSIS — E119 Type 2 diabetes mellitus without complications: Secondary | ICD-10-CM | POA: Diagnosis not present

## 2016-03-19 DIAGNOSIS — R1310 Dysphagia, unspecified: Secondary | ICD-10-CM | POA: Insufficient documentation

## 2016-03-19 DIAGNOSIS — R1311 Dysphagia, oral phase: Secondary | ICD-10-CM

## 2016-03-19 DIAGNOSIS — R1312 Dysphagia, oropharyngeal phase: Secondary | ICD-10-CM

## 2016-03-19 LAB — GLUCOSE, CAPILLARY
GLUCOSE-CAPILLARY: 90 mg/dL (ref 65–99)
Glucose-Capillary: 134 mg/dL — ABNORMAL HIGH (ref 65–99)
Glucose-Capillary: 183 mg/dL — ABNORMAL HIGH (ref 65–99)
Glucose-Capillary: 264 mg/dL — ABNORMAL HIGH (ref 65–99)

## 2016-03-20 NOTE — Therapy (Signed)
[place], [place], [address] Phone: [phone]   Fax:     Modified Barium Swallow  Patient Details  Name: Pamela Preston MRN: [id number] Date of Birth: [birth year] No Data Recorded  Encounter Date: 03/19/2016      End of Session - 03/20/16 0803    Visit Number 1   Number of Visits 1   Date for SLP Re-Evaluation 03/19/16   SLP Start Time 72   SLP Stop Time  1500   SLP Time Calculation (min) 60 min   Activity Tolerance Patient tolerated treatment well      Past Medical History:  Diagnosis Date  . Abnormal LFTs   . Breast cancer ([place]) 2013   c Lumpectomy 2013  . Diabetes mellitus without complication ([place])   . GERD (gastroesophageal reflux disease)   . Gout   . Hyperlipidemia   . Hypertension   . Pancreatic cancer ([place])   . Thyroid disease   . Uterine cancer ([facility]) 1960    Past Surgical History:  Procedure Laterality Date  . ABDOMINAL HYSTERECTOMY    . BREAST BIOPSY Right 2012   Positive  . ERCP N/A 09/04/2015   Procedure: ENDOSCOPIC RETROGRADE CHOLANGIOPANCREATOGRAPHY (ERCP);  Surgeon: Hulen Luster, MD;  Location: [facility] ENDOSCOPY;  Service: Gastroenterology;  Laterality: N/A;  . ERCP N/A 11/22/2015   Procedure: ENDOSCOPIC RETROGRADE CHOLANGIOPANCREATOGRAPHY (ERCP);  Surgeon: Hulen Luster, MD;  Location: [facility] ENDOSCOPY;  Service: Endoscopy;  Laterality: N/A;  For Thursday afternoon  . KNEE ARTHROSCOPY    . PERIPHERAL VASCULAR CATHETERIZATION N/A 11/05/2015   Procedure: Glori Luis Cath Insertion;  Surgeon: Algernon Huxley, MD;  Location: [facility] CV LAB;  Service: Cardiovascular;  Laterality: N/A;  . UPPER ESOPHAGEAL ENDOSCOPIC ULTRASOUND (EUS) N/A 10/04/2015   Procedure: UPPER ESOPHAGEAL ENDOSCOPIC ULTRASOUND (EUS);  Surgeon: Holly Bodily, MD;  Location: [facility] ENDOSCOPY;  Service: Gastroenterology;  Laterality: N/A;    There were no vitals filed for this visit.   Subjective: Patient  behavior: (alertness, ability to follow instructions, etc.): Patient is confused; did not accept liquid POs for assessment "I don't like water"  Chief complaint: Per treating SLP, patient demonstrates moderate to severe pocketing during intake of mechanical soft diet with thin liquids.  Chart review shows that the patient was diagnosed with Protein malnutrition in January of this year.  The patient had an MRI 02/22/2016 showed left paramedian pons infarct and moderate cerebral atrophy.   Objective:  Radiological Procedure: A videoflouroscopic evaluation of oral-preparatory, reflex initiation, and pharyngeal phases of the swallow was performed; as well as a screening of the upper esophageal phase.  I. POSTURE: Upright in MBS chair  II. VIEW: Lateral  III. COMPENSATORY STRATEGIES: verbal cues, spoon presentation of liquid, SLP administered cup rim presentation of liquid- no methods improved acceptance of liquids  IV. BOLUSES ADMINISTERED:   Thin Liquid: "None accepted- attempted self administered cup rim sip, straw, spoon presentation, "force feed" by SLP- patient accepted on 1/8 teaspoon or spit out.  Did not take enough to elicit a pharyngeal swallow.    Nectar-thick Liquid: Accepted 1/8 teaspoon bolus; did not elicit a pharyngeal swallow    Puree: 2 teaspoon presentations   Mechanical Soft: 1/4 graham cracker in applesauce  V. RESULTS OF EVALUATION: A. ORAL PREPARATORY PHASE: (The lips, tongue, and velum are observed for strength and coordination)       **Overall Severity Rating: Moderate-severe; The patient accepted and swallowed a graham cracker in  applesauce, using an anterior munch pattern. The patient would not accept liquid POs given via cup rim, straw, spoon, and assisted cup rim).  The patient either blocked presented spoon/cup or spit out liquid that was introduced into the oral cavity.    B. SWALLOW INITIATION/REFLEX: (The reflex is normal if "triggered" by the time the bolus  reached the base of the tongue)  **Overall Severity Rating: Within normal limits for 3 solid boluses  C. PHARYNGEAL PHASE: (Pharyngeal function is normal if the bolus shows rapid, smooth, and continuous transit through the pharynx and there is no pharyngeal residue after the swallow)  **Overall Severity Rating: Within normal limits for 3 solid boluses  D. LARYNGEAL PENETRATION: (Material entering into the laryngeal inlet/vestibule but not aspirated) None with solid boluses; patient did not accept liquid boluses for assessment  E. ASPIRATION: None with solid boluses; patient did not accept liquid boluses for assessment  F. ESOPHAGEAL PHASE: (Screening of the upper esophagus): unable to view esophagus due to shoulder shadow  ASSESSMENT: 82 year old woman with recent MRI (02/22/2016) showing acute pontine infarct and moderate cerebral atrophy is presenting with moderate-severe oral stage dysphagia characterized by poor mastication, prolonged posterior transfer solid boluses, and inconsistent acceptance of presented POs.  The patient accepted and swallowed 2 teaspoon presentations of applesauce (taking over 20 seconds to effect posterior transfer).  The patient accepted and swallowed a graham cracker in applesauce, using an anterior munch pattern. The patient would not accept liquid POs given via cup rim, straw, spoon, and assisted cup rim).  The patient either blocked presented spoon/cup or spit out liquid that was introduced into the oral cavity.  The pharyngeal phase of swallowing was evaluated with the 3 solid boluses only.  Timing of the 3 boluses was within normal limits, triggering as the bolus head passes the base of the tongue.  In all 3 cases, the pharyngeal phase appears to be within normal limits once elicited- aspects of the pharyngeal stage of swallowing including tongue base retraction, hyolaryngeal excursion, epiglottic inversion, duration/amplitude of UES opening, and laryngeal vestibule  closure at the height of the swallow are within normal limits.  There was no pharyngeal residue residue.  There was no observed laryngeal penetration or tracheal aspiration of the solid boluses.  This limited study is most consistent with an apraxia of swallowing (or behavioral symptoms) vs. pharyngeal dysphagia and the patient does not appear to be at significant risk for prandial aspiration.  The patient may benefit from heightened sensory input to speed oral acceptance as well as initiation/completion of the oral stage of swallowing.    PLAN/RECOMMENDATIONS:   A. Diet: Continue mechanical soft diet with thin liquid   B. Swallowing Precautions: Be sure a pharyngeal swallow has been elicited before giving more POs   C. Recommended consultation to: dietician- is the patient able to meet her nutrition/hydration needs by mouth   D. Therapy recommendations: continue SLP; try strategies to improve oral acceptance and initiation/completion of oral stage of swallowing   E. Final report is routed to referring practitioner and faxed to treating SLP.  Patient will benefit from skilled therapeutic intervention in order to improve the following deficits and impairments:   Dysphagia, oral phase  Oropharyngeal dysphagia - Plan: DG OP Swallowing Func-Medicare/Speech Path, DG OP Swallowing Func-Medicare/Speech Path      G-Codes - 04/04/16 0804    Functional Assessment Tool Used MBS, clinical judgment   Functional Limitations Swallowing   Swallow Current Status [id number]) At least 60 percent  but less than 80 percent impaired, limited or restricted   Swallow Goal Status [phone]) At least 60 percent but less than 80 percent impaired, limited or restricted   Swallow Discharge Status [phone]) At least 60 percent but less than 80 percent impaired, limited or restricted          Problem List Patient Active Problem List   Diagnosis Date Noted  . TIA (transient ischemic attack) 02/19/2016  . Acute on  chronic renal failure ([place])   . Lactic acidosis   . Arterial hypotension   . Poor appetite   . Dehydration   . Abdominal pain   . Septic shock ([place]) 01/05/2016  . Pressure ulcer 11/21/2015  . Sepsis ([place]) 11/20/2015  . Jaundice 11/20/2015  . Allergic rhinitis 10/16/2015  . Controlled type 2 diabetes mellitus without complication ([place]) [id number]  . Acid reflux 10/16/2015  . Gout 10/16/2015  . H/O malignant neoplasm of breast 10/16/2015  . HLD (hyperlipidemia) 10/16/2015  . BP (high blood pressure) 10/16/2015  . Adult hypothyroidism 10/16/2015  . Anemia, iron deficiency 10/16/2015  . Morbid obesity ([place]) 10/16/2015  . Adiposity 10/16/2015  . Arthritis, degenerative 10/16/2015  . Primary malignant neoplasm of head of pancreas ([place]) 10/11/2015  . Protein-calorie malnutrition, severe 09/02/2015  . Severe protein-calorie malnutrition Altamease Oiler: less than 60% of standard weight) ([place]) 09/02/2015  . Obstructive jaundice 08/31/2015  . Biliary and gallbladder disorder 08/31/2015  . Abnormal loss of weight 07/26/2015  . D (diarrhea) 07/26/2015  . Abnormal weight loss 07/26/2015  . Type 2 diabetes mellitus ([place]) 03/11/2015  . Right hip pain 01/24/2013  . HTN (hypertension) 01/24/2013  . CKD (chronic kidney disease) stage 5, GFR less than 15 ml/min (HCC) 01/24/2013  . Diabetes mellitus type 2 in obese ([place]) 01/24/2013  . Chronic kidney disease, stage IV (severe) ([place]) 01/24/2013  . Essential (primary) hypertension 01/24/2013  . Arthralgia of hip or thigh 01/24/2013   Leroy Sea, MS/CCC- SLP  Lou Miner 03/20/2016, 8:06 AM  [place], [place], [address] Phone: [phone]   Fax:     Name: Pamela Preston MRN: [id number] Date of Birth: [birth year]

## 2016-04-04 ENCOUNTER — Other Ambulatory Visit: Payer: Self-pay

## 2016-04-04 DIAGNOSIS — N185 Chronic kidney disease, stage 5: Secondary | ICD-10-CM

## 2016-04-07 ENCOUNTER — Non-Acute Institutional Stay (SKILLED_NURSING_FACILITY): Payer: Medicare Other | Admitting: Gerontology

## 2016-04-07 DIAGNOSIS — R001 Bradycardia, unspecified: Secondary | ICD-10-CM | POA: Diagnosis not present

## 2016-04-07 DIAGNOSIS — I10 Essential (primary) hypertension: Secondary | ICD-10-CM | POA: Diagnosis not present

## 2016-04-08 DIAGNOSIS — E119 Type 2 diabetes mellitus without complications: Secondary | ICD-10-CM | POA: Diagnosis not present

## 2016-04-08 LAB — GLUCOSE, CAPILLARY
GLUCOSE-CAPILLARY: 144 mg/dL — AB (ref 65–99)
GLUCOSE-CAPILLARY: 141 mg/dL — AB (ref 65–99)

## 2016-04-10 DIAGNOSIS — E119 Type 2 diabetes mellitus without complications: Secondary | ICD-10-CM | POA: Diagnosis present

## 2016-04-10 LAB — GLUCOSE, CAPILLARY
GLUCOSE-CAPILLARY: 155 mg/dL — AB (ref 65–99)
GLUCOSE-CAPILLARY: 148 mg/dL — AB (ref 65–99)
Glucose-Capillary: 125 mg/dL — ABNORMAL HIGH (ref 65–99)

## 2016-04-11 DIAGNOSIS — E119 Type 2 diabetes mellitus without complications: Secondary | ICD-10-CM | POA: Diagnosis not present

## 2016-04-11 LAB — GLUCOSE, CAPILLARY
GLUCOSE-CAPILLARY: 117 mg/dL — AB (ref 65–99)
GLUCOSE-CAPILLARY: 160 mg/dL — AB (ref 65–99)
GLUCOSE-CAPILLARY: 184 mg/dL — AB (ref 65–99)
Glucose-Capillary: 179 mg/dL — ABNORMAL HIGH (ref 65–99)

## 2016-04-12 DIAGNOSIS — E119 Type 2 diabetes mellitus without complications: Secondary | ICD-10-CM | POA: Diagnosis not present

## 2016-04-12 LAB — GLUCOSE, CAPILLARY
GLUCOSE-CAPILLARY: 135 mg/dL — AB (ref 65–99)
Glucose-Capillary: 134 mg/dL — ABNORMAL HIGH (ref 65–99)

## 2016-04-12 NOTE — Progress Notes (Signed)
Location:      Place of Service:  SNF (31) Provider:  Toni Arthurs, NP-C  Raylene Miyamoto., MD  Patient Care Team: Raylene Miyamoto, MD as PCP - General (Pulmonary Disease) [name], RN as Registered Nurse  Extended Emergency Contact Information Primary Emergency Contact: Leeanne Deed [place] Phone: [phone] Mobile Phone: [phone] Relation: Sister  Code Status:  full Goals of care: Advanced Directive information Advanced Directives 02/14/2016  Does patient have an advance directive? Yes  Type of Advance Directive -  Does patient want to make changes to advanced directive? -  Copy of advanced directive(s) in chart? -  Would patient like information on creating an advanced directive? -     Chief Complaint  Patient presents with  . Medical Management of Chronic Issues    HPI:  Pt is a 82 y.o. female seen today for medical management of chronic diseases. She is at the facility for rehab/ deconditioning following multiple cancer diagnoses/ metastses, PNA and CVA. At this point she has decided she is no longer going to eat and is refusing all meds. She reports she "knows what she is doing." However, she wishes to continue to be a full code. She is being followed by Hospice services. She does have chronic essential hypertension. Her diastolic pressures have been trending downward and pulse is consistently in the 50's. She denies severe pain, chest pain, shortness of breath.     Past Medical History:  Diagnosis Date  . Abnormal LFTs   . Breast cancer ([place]) 2013   c Lumpectomy 2013  . Diabetes mellitus without complication ([place])   . GERD (gastroesophageal reflux disease)   . Gout   . Hyperlipidemia   . Hypertension   . Pancreatic cancer ([place])   . Thyroid disease   . Uterine cancer ([place]) 1960   Past Surgical History:  Procedure Laterality Date  . ABDOMINAL HYSTERECTOMY    . BREAST BIOPSY Right 2012   Positive  . ERCP N/A  09/04/2015   Procedure: ENDOSCOPIC RETROGRADE CHOLANGIOPANCREATOGRAPHY (ERCP);  Surgeon: Hulen Luster, MD;  Location: [facility] ENDOSCOPY;  Service: Gastroenterology;  Laterality: N/A;  . ERCP N/A 11/22/2015   Procedure: ENDOSCOPIC RETROGRADE CHOLANGIOPANCREATOGRAPHY (ERCP);  Surgeon: Hulen Luster, MD;  Location: [facility] ENDOSCOPY;  Service: Endoscopy;  Laterality: N/A;  For Thursday afternoon  . KNEE ARTHROSCOPY    . PERIPHERAL VASCULAR CATHETERIZATION N/A 11/05/2015   Procedure: Glori Luis Cath Insertion;  Surgeon: Algernon Huxley, MD;  Location: [place] CV LAB;  Service: Cardiovascular;  Laterality: N/A;  . UPPER ESOPHAGEAL ENDOSCOPIC ULTRASOUND (EUS) N/A 10/04/2015   Procedure: UPPER ESOPHAGEAL ENDOSCOPIC ULTRASOUND (EUS);  Surgeon: Holly Bodily, MD;  Location: [facility] ENDOSCOPY;  Service: Gastroenterology;  Laterality: N/A;    Allergies  Allergen Reactions  . Colchicine Other (See Comments)    Unknown reaction  . Ketorolac Other (See Comments)    Unknown reaction  . Lactase Diarrhea  . Lipitor [Atorvastatin] Other (See Comments)    Unknown reaction      Medication List       Accurate as of 04/07/16 11:59 PM. Always use your most recent med list.          amLODipine 5 MG tablet Commonly known as:  NORVASC Take 1 tablet (5 mg total) by mouth daily.   amoxicillin-clavulanate 875-125 MG tablet Commonly known as:  AUGMENTIN Take 1 tablet by mouth 2 (two) times daily.   aspirin EC 81 MG tablet Take 81 mg by  mouth daily.   buPROPion 150 MG 12 hr tablet Commonly known as:  WELLBUTRIN SR Take 150 mg by mouth 2 (two) times daily. Reported on 12/06/2015   glipiZIDE 5 MG tablet Commonly known as:  GLUCOTROL Take 5 mg by mouth daily before breakfast.   HYDROcodone-acetaminophen 5-325 MG tablet Commonly known as:  NORCO/VICODIN Take 1 tablet by mouth every 6 (six) hours as needed for moderate pain.   letrozole 2.5 MG tablet Commonly known as:  FEMARA Take 2.5 mg by mouth daily.   LEVEMIR  100 UNIT/ML injection Generic drug:  insulin detemir Inject 12-16 Units into the skin See admin instructions. Inject 16 units subcutaneous every morning at 7 am, and Inject 12 units subcutaneous every evening at 6 pm.   levothyroxine 75 MCG tablet Commonly known as:  SYNTHROID, LEVOTHROID Take 75 mcg by mouth daily before breakfast.   lidocaine 4 % cream Commonly known as:  LMX Apply 1 application topically See admin instructions. Apply lidocaine with tegaderm to port a cath area prior to chemo appointment once a day on Monday, Tuesday, Wednesday, Thursday, and Friday only.   lisinopril 20 MG tablet Commonly known as:  PRINIVIL,ZESTRIL Take 20 mg by mouth daily.   loperamide 2 MG capsule Commonly known as:  IMODIUM Take 1 capsule by mouth every 6 hours as needed for diarrhea.   metoprolol succinate 25 MG 24 hr tablet Commonly known as:  TOPROL XL Take 1 tablet (25 mg total) by mouth daily.   omeprazole 20 MG capsule Commonly known as:  PRILOSEC Take 20 mg by mouth daily.   ondansetron 4 MG tablet Commonly known as:  ZOFRAN Take 4 mg by mouth 3 (three) times daily.   POLYETHYLENE GLYCOL 3350 PO Take 17 g by mouth daily as needed (for constipation). *Mix in 4-8 oz liquid of choice*   Potassium Chloride ER 20 MEQ Tbcr Take 20 mEq by mouth 2 (two) times daily.   pravastatin 40 MG tablet Commonly known as:  PRAVACHOL Take 40 mg by mouth daily.   promethazine 25 MG tablet Commonly known as:  PHENERGAN Take 25 mg by mouth every 6 (six) hours.   ULORIC 80 MG Tabs Generic drug:  Febuxostat Take 80 mg by mouth daily.       Review of Systems  Constitutional: Positive for appetite change. Negative for activity change, chills, diaphoresis and fever.  HENT: Negative for congestion, sneezing, sore throat, trouble swallowing and voice change.   Eyes: Negative for pain, redness and visual disturbance.  Respiratory: Negative for apnea, cough, choking, chest tightness, shortness  of breath and wheezing.   Cardiovascular: Negative for chest pain, palpitations and leg swelling.  Gastrointestinal: Negative for abdominal distention, abdominal pain, constipation, diarrhea and nausea.  Genitourinary: Negative for difficulty urinating, dysuria, frequency and urgency.  Musculoskeletal: Positive for arthralgias (typical arthritis) and gait problem. Negative for back pain and myalgias.  Skin: Negative for color change, pallor, rash and wound.  Neurological: Positive for speech difficulty. Negative for dizziness, tremors, syncope, weakness, numbness and headaches.  Psychiatric/Behavioral: Negative for agitation and behavioral problems.  All other systems reviewed and are negative.    There is no immunization history on file for this patient. Pertinent  Health Maintenance Due  Topic Date Due  . FOOT EXAM  11/29/1939  . OPHTHALMOLOGY EXAM  11/29/1939  . PNA vac Low Risk Adult (1 of 2 - PCV13) 11/29/1996  . INFLUENZA VACCINE  03/18/2016  . HEMOGLOBIN A1C  05/21/2016  . DEXA SCAN  Completed   No flowsheet data found. Functional Status Survey:    Vitals:   04/12/16 1031  BP: 134/64  Pulse: (!) 50  Resp: 14  Temp: (!) 96 F (35.6 C)  SpO2: 100%  Weight: 222 lb 6.4 oz (100.9 kg)   Body mass index is 33.82 kg/m. Physical Exam  Constitutional: She is oriented to person, place, and time. Vital signs are normal. She appears well-developed. She is active and cooperative. She does not appear ill. No distress.  obese  HENT:  Head: Normocephalic and atraumatic.  Mouth/Throat: Uvula is midline, oropharynx is clear and moist and mucous membranes are normal. Mucous membranes are not pale, not dry and not cyanotic.  Eyes: Conjunctivae, EOM and lids are normal. Pupils are equal, round, and reactive to light.  Neck: Trachea normal, normal range of motion and full passive range of motion without pain. Neck supple. No JVD present. No tracheal deviation, no edema and no erythema  present. No thyromegaly present.  Cardiovascular: Regular rhythm, intact distal pulses and normal pulses.  Bradycardia present.  Exam reveals no gallop, no distant heart sounds and no friction rub.   No murmur heard. Pulmonary/Chest: Effort normal and breath sounds normal. No accessory muscle usage. No respiratory distress. She has no decreased breath sounds. She has no wheezes. She has no rhonchi. She has no rales. She exhibits no tenderness.  Abdominal: Normal appearance and bowel sounds are normal. She exhibits no distension and no ascites. There is no tenderness.  Musculoskeletal: Normal range of motion. She exhibits no edema or tenderness.  Expected osteoarthritis, stiffness  Neurological: She is alert and oriented to person, place, and time. She has normal strength.  Skin: Skin is warm, dry and intact. She is not diaphoretic. No cyanosis. No pallor. Nails show no clubbing.  Psychiatric: Her behavior is normal. Thought content normal. Her affect is blunt. Her speech is delayed. Cognition and memory are normal. She expresses inappropriate judgment. She exhibits a depressed mood.  Nursing note and vitals reviewed.   Labs reviewed:  Recent Labs  11/08/15 0916 11/15/15 1158  02/12/16 1230 02/14/16 1000 02/19/16 1940  NA 138 137  < > 137 136 137  K 3.4* 3.2*  < > 3.8 4.0 4.2  CL 104 104  < > 106 103 103  CO2 27 25  < > 25 25 27   GLUCOSE 63* 88  < > 101* 139* 245*  BUN 20 23*  < > 26* 26* 23*  CREATININE 1.91* 1.90*  < > 2.68* 2.24* 2.31*  CALCIUM 8.7* 8.6*  < > 8.1* 8.0* 8.0*  MG 1.8 1.7  --   --   --  1.6*  < > = values in this interval not displayed.  Recent Labs  01/12/16 1200 02/12/16 1230 02/14/16 1000  AST 26 33 34  ALT 20 19 21   ALKPHOS 144* 169* 189*  BILITOT 0.3 0.1* 0.4  PROT 5.4* 5.2* 5.7*  ALBUMIN 2.1* 2.1* 2.4*    Recent Labs  02/12/16 1230 02/14/16 1000 02/19/16 1940  WBC 6.8 6.3 5.1  NEUTROABS 4.3 4.3 3.0  HGB 9.3* 10.2* 9.6*  HCT 28.0* 30.9* 28.6*   MCV 94.6 94.0 94.2  PLT 254 302 262   Lab Results  Component Value Date   TSH 3.401 01/06/2016   Lab Results  Component Value Date   HGBA1C 6.2 (H) 11/20/2015   No results found for: CHOL, HDL, LDLCALC, LDLDIRECT, TRIG, CHOLHDL  Significant Diagnostic Results in last 30 days:  Dg  Op Swallowing Func-medicare/speech Path  Result Date: 03/19/2016 CLINICAL DATA:  Dysphagia EXAM: MODIFIED BARIUM SWALLOW TECHNIQUE: Different consistencies of barium were administered orally to the patient by the Speech Pathologist. Imaging of the pharynx was performed in the lateral projection. FLUOROSCOPY TIME:  Fluoroscopy Time:  3 minutes, 0 seconds Number of Acquired Images: Approximately 14 video sequences and 2 spot films COMPARISON:  None in PACs FINDINGS: Thin liquid- patient refused to drink Nectar thick liquid- patient refused to drink Pure- delayed initiation of the primary voluntary swallowing maneuver ; once the bolus was mobilized into the posterior oropharynx the involuntary portion of the swallowing maneuver appeared normal. There was no laryngeal penetration of the barium. Pure with cracker- delayed initiation of the primary voluntary swallowing maneuver ; once the bolus was mobilized into the posterior oropharynx the involuntary portion of the swallowing maneuver appeared normal. There was no laryngeal penetration of the barium. IMPRESSION: The patient was un willing to drink the thin or nectar thick liquids for the study. Please see the discussion above regarding the swallowing of the puree and puree with cracker preparations. Please refer to the Speech Pathologists report for complete details and recommendations. Electronically Signed   By: David  Martinique M.D.   On: 03/19/2016 15:21    Assessment/Plan 1. Bradycardia  DC Metoprolol  Decrease Lisinopril to 10 mg PO Q Day  2. Essential hypertension  Decrease Lisinopril to 10 mg po Q Day  Family/ staff Communication:   Total Time:30  minutes  Documentation: 20 minutes  Face to Face: 10 minutes  Family/Phone:   Labs/tests ordered: none  Medication list reviewed and assessed for continued appropriateness. Monthly medication orders reviewed and signed.  Vikki Ports, NP-C Geriatrics [facility] [phone], [place] [address] Cell Phone (Mon-Fri 8am-5pm):  [phone] On Call:  [phone] & follow prompts after 5pm & weekends Office Phone:  [phone] Office Fax:  [phone]

## 2016-04-13 DIAGNOSIS — E119 Type 2 diabetes mellitus without complications: Secondary | ICD-10-CM | POA: Diagnosis not present

## 2016-04-13 LAB — GLUCOSE, CAPILLARY
GLUCOSE-CAPILLARY: 123 mg/dL — AB (ref 65–99)
GLUCOSE-CAPILLARY: 156 mg/dL — AB (ref 65–99)
Glucose-Capillary: 168 mg/dL — ABNORMAL HIGH (ref 65–99)

## 2016-04-14 DIAGNOSIS — E119 Type 2 diabetes mellitus without complications: Secondary | ICD-10-CM | POA: Diagnosis not present

## 2016-04-14 LAB — GLUCOSE, CAPILLARY
GLUCOSE-CAPILLARY: 175 mg/dL — AB (ref 65–99)
Glucose-Capillary: 130 mg/dL — ABNORMAL HIGH (ref 65–99)
Glucose-Capillary: 130 mg/dL — ABNORMAL HIGH (ref 65–99)
Glucose-Capillary: 182 mg/dL — ABNORMAL HIGH (ref 65–99)

## 2016-04-15 DIAGNOSIS — E119 Type 2 diabetes mellitus without complications: Secondary | ICD-10-CM | POA: Diagnosis not present

## 2016-04-15 LAB — GLUCOSE, CAPILLARY
GLUCOSE-CAPILLARY: 162 mg/dL — AB (ref 65–99)
GLUCOSE-CAPILLARY: 189 mg/dL — AB (ref 65–99)
Glucose-Capillary: 124 mg/dL — ABNORMAL HIGH (ref 65–99)
Glucose-Capillary: 155 mg/dL — ABNORMAL HIGH (ref 65–99)

## 2016-04-16 DIAGNOSIS — E119 Type 2 diabetes mellitus without complications: Secondary | ICD-10-CM | POA: Diagnosis not present

## 2016-04-16 LAB — GLUCOSE, CAPILLARY
GLUCOSE-CAPILLARY: 130 mg/dL — AB (ref 65–99)
Glucose-Capillary: 149 mg/dL — ABNORMAL HIGH (ref 65–99)
Glucose-Capillary: 181 mg/dL — ABNORMAL HIGH (ref 65–99)

## 2016-04-17 DIAGNOSIS — E119 Type 2 diabetes mellitus without complications: Secondary | ICD-10-CM | POA: Diagnosis not present

## 2016-04-17 LAB — GLUCOSE, CAPILLARY
GLUCOSE-CAPILLARY: 128 mg/dL — AB (ref 65–99)
GLUCOSE-CAPILLARY: 243 mg/dL — AB (ref 65–99)
Glucose-Capillary: 162 mg/dL — ABNORMAL HIGH (ref 65–99)
Glucose-Capillary: 282 mg/dL — ABNORMAL HIGH (ref 65–99)

## 2016-04-18 ENCOUNTER — Encounter
Admission: RE | Admit: 2016-04-18 | Discharge: 2016-04-18 | Disposition: A | Discharge status: Home / Self Care | Source: Ambulatory Visit | Attending: Internal Medicine | Admitting: Internal Medicine

## 2016-04-18 DIAGNOSIS — E119 Type 2 diabetes mellitus without complications: Secondary | ICD-10-CM | POA: Diagnosis not present

## 2016-04-18 LAB — GLUCOSE, CAPILLARY
GLUCOSE-CAPILLARY: 257 mg/dL — AB (ref 65–99)
GLUCOSE-CAPILLARY: 232 mg/dL — AB (ref 65–99)
Glucose-Capillary: 164 mg/dL — ABNORMAL HIGH (ref 65–99)

## 2016-04-19 DIAGNOSIS — E119 Type 2 diabetes mellitus without complications: Secondary | ICD-10-CM | POA: Diagnosis not present

## 2016-04-19 LAB — GLUCOSE, CAPILLARY
GLUCOSE-CAPILLARY: 138 mg/dL — AB (ref 65–99)
GLUCOSE-CAPILLARY: 143 mg/dL — AB (ref 65–99)
GLUCOSE-CAPILLARY: 245 mg/dL — AB (ref 65–99)
Glucose-Capillary: 291 mg/dL — ABNORMAL HIGH (ref 65–99)

## 2016-04-20 DIAGNOSIS — E119 Type 2 diabetes mellitus without complications: Secondary | ICD-10-CM | POA: Diagnosis not present

## 2016-04-20 LAB — GLUCOSE, CAPILLARY: GLUCOSE-CAPILLARY: 148 mg/dL — AB (ref 65–99)

## 2016-04-21 DIAGNOSIS — E119 Type 2 diabetes mellitus without complications: Secondary | ICD-10-CM | POA: Diagnosis not present

## 2016-04-21 LAB — GLUCOSE, CAPILLARY
GLUCOSE-CAPILLARY: 213 mg/dL — AB (ref 65–99)
GLUCOSE-CAPILLARY: 236 mg/dL — AB (ref 65–99)
Glucose-Capillary: 144 mg/dL — ABNORMAL HIGH (ref 65–99)
Glucose-Capillary: 159 mg/dL — ABNORMAL HIGH (ref 65–99)

## 2016-04-22 ENCOUNTER — Non-Acute Institutional Stay (SKILLED_NURSING_FACILITY): Payer: Medicare Other | Admitting: Gerontology

## 2016-04-22 DIAGNOSIS — E119 Type 2 diabetes mellitus without complications: Secondary | ICD-10-CM | POA: Diagnosis not present

## 2016-04-22 DIAGNOSIS — C250 Malignant neoplasm of head of pancreas: Secondary | ICD-10-CM

## 2016-04-22 LAB — GLUCOSE, CAPILLARY
Glucose-Capillary: 140 mg/dL — ABNORMAL HIGH (ref 65–99)
Glucose-Capillary: 163 mg/dL — ABNORMAL HIGH (ref 65–99)

## 2016-04-24 ENCOUNTER — Telehealth: Payer: Self-pay | Admitting: *Deleted

## 2016-04-24 NOTE — Telephone Encounter (Signed)
rcvd fax msg that patient wanted to enroll in hospice services-asking Dr. Rogue Bussing to sign orders. AT lat office visit, pt did not want to yet consider hospice services.  Pt had elected to repeat pet scan in 3 months and have labs/md visit in September.    Per v/o Dr. Delton See patient's sister to inquire about hospice care decision and current health condition of patient.    Sister reports that patient has "hx of recent TIA. Failure to thrive-weakness. There has been a definite decline in her health condition  My sister was at [facility] place. I made the decision to take her home with hospice in my own home.  I don't think it's her pancreatic cancer as much as it is her developing problems from the previous TIA side effects.  I am 82 years old myself. I can not take care of my sister by myself; but I want to keep her comfortable as long as possible without bringing her to appointments.  I am unable to transport her to appointment other than by EMS services."   Per sister- Pt has been "in much pain and has had to use the comfort kit at hospice, sleep deprivation, dysphagia, frequent aspiration of foods, refuses to eat."  Due to failure to thrive, sister would like to cnl all upcoming appointments in cancer center including the upcoming pet scan. msg sent to scheduling to cnl these appointment.  Dr. B made aware of the declination of patient's health and agreed to sign hospice orders.

## 2016-04-27 NOTE — Progress Notes (Signed)
Location:      Place of Service:  SNF (31)  Provider: Toni Arthurs, NP-C  PCP: Raylene Miyamoto., MD Patient Care Team: Raylene Miyamoto, MD as PCP - General (Pulmonary Disease) [name], RN as Registered Nurse  Extended Emergency Contact Information Primary Emergency Contact: Leeanne Deed [place] Phone: [phone] Mobile Phone: [phone] Relation: Sister  Code Status: full Goals of care:  Advanced Directive information Advanced Directives 02/14/2016  Does patient have an advance directive? Yes  Type of Advance Directive -  Does patient want to make changes to advanced directive? -  Copy of advanced directive(s) in chart? -  Would patient like information on creating an advanced directive? -     Allergies  Allergen Reactions  . Colchicine Other (See Comments)    Unknown reaction  . Ketorolac Other (See Comments)    Unknown reaction  . Lactase Diarrhea  . Lipitor [Atorvastatin] Other (See Comments)    Unknown reaction    Chief Complaint  Patient presents with  . Discharge Note    HPI:  82 y.o. female at the facility for rehab for deconditioning from acute on chronic renal failure and sepsis. She does have multiple co-morbidities causing difficulty in her rehab such as pancreatic cancer, breast cancer, lung cancer etc with mets. While in rehab, she suffered a CVA. Eventually she stopped eating and refused all meds. She told multiple staff members she knew what she was doing, and she was tired. Palliative Care followed pt while here. Initially, her sister was very resistant to making pt comfort care. Sister made the decision to take pt home on [facility].     Past Medical History:  Diagnosis Date  . Abnormal LFTs   . Breast cancer ([place]) 2013   c Lumpectomy 2013  . Diabetes mellitus without complication ([place])   . GERD (gastroesophageal reflux disease)   . Gout   . Hyperlipidemia   . Hypertension   . Pancreatic  cancer ([place])   . Thyroid disease   . Uterine cancer ([place]) 1960    Past Surgical History:  Procedure Laterality Date  . ABDOMINAL HYSTERECTOMY    . BREAST BIOPSY Right 2012   Positive  . ERCP N/A 09/04/2015   Procedure: ENDOSCOPIC RETROGRADE CHOLANGIOPANCREATOGRAPHY (ERCP);  Surgeon: Hulen Luster, MD;  Location: [facility] ENDOSCOPY;  Service: Gastroenterology;  Laterality: N/A;  . ERCP N/A 11/22/2015   Procedure: ENDOSCOPIC RETROGRADE CHOLANGIOPANCREATOGRAPHY (ERCP);  Surgeon: Hulen Luster, MD;  Location: [facility] ENDOSCOPY;  Service: Endoscopy;  Laterality: N/A;  For Thursday afternoon  . KNEE ARTHROSCOPY    . PERIPHERAL VASCULAR CATHETERIZATION N/A 11/05/2015   Procedure: Glori Luis Cath Insertion;  Surgeon: Algernon Huxley, MD;  Location: [facility] CV LAB;  Service: Cardiovascular;  Laterality: N/A;  . UPPER ESOPHAGEAL ENDOSCOPIC ULTRASOUND (EUS) N/A 10/04/2015   Procedure: UPPER ESOPHAGEAL ENDOSCOPIC ULTRASOUND (EUS);  Surgeon: Holly Bodily, MD;  Location: [facility] ENDOSCOPY;  Service: Gastroenterology;  Laterality: N/A;      reports that she has never smoked. She does not have any smokeless tobacco history on file. She reports that she does not drink alcohol or use drugs. Social History   Social History  . Marital status: Widowed    Spouse name: N/A  . Number of children: N/A  . Years of education: N/A   Occupational History  . disabled    Social History Main Topics  . Smoking status: Never Smoker  . Smokeless tobacco: Not on file  . Alcohol  use No  . Drug use: No  . Sexual activity: Not on file   Other Topics Concern  . Not on file   Social History Narrative  . No narrative on file   Functional Status Survey:    Allergies  Allergen Reactions  . Colchicine Other (See Comments)    Unknown reaction  . Ketorolac Other (See Comments)    Unknown reaction  . Lactase Diarrhea  . Lipitor [Atorvastatin] Other (See Comments)    Unknown reaction    Pertinent  Health Maintenance Due    Topic Date Due  . FOOT EXAM  11/29/1939  . OPHTHALMOLOGY EXAM  11/29/1939  . PNA vac Low Risk Adult (1 of 2 - PCV13) 11/29/1996  . INFLUENZA VACCINE  03/18/2016  . HEMOGLOBIN A1C  05/21/2016  . DEXA SCAN  Completed    Medications:   Medication List       Accurate as of 04/22/16 11:59 PM. Always use your most recent med list.          amLODipine 5 MG tablet Commonly known as:  NORVASC Take 1 tablet (5 mg total) by mouth daily.   amoxicillin-clavulanate 875-125 MG tablet Commonly known as:  AUGMENTIN Take 1 tablet by mouth 2 (two) times daily.   aspirin EC 81 MG tablet Take 81 mg by mouth daily.   buPROPion 150 MG 12 hr tablet Commonly known as:  WELLBUTRIN SR Take 150 mg by mouth 2 (two) times daily. Reported on 12/06/2015   glipiZIDE 5 MG tablet Commonly known as:  GLUCOTROL Take 5 mg by mouth daily before breakfast.   HYDROcodone-acetaminophen 5-325 MG tablet Commonly known as:  NORCO/VICODIN Take 1 tablet by mouth every 6 (six) hours as needed for moderate pain.   letrozole 2.5 MG tablet Commonly known as:  FEMARA Take 2.5 mg by mouth daily.   LEVEMIR 100 UNIT/ML injection Generic drug:  insulin detemir Inject 12-16 Units into the skin See admin instructions. Inject 16 units subcutaneous every morning at 7 am, and Inject 12 units subcutaneous every evening at 6 pm.   levothyroxine 75 MCG tablet Commonly known as:  SYNTHROID, LEVOTHROID Take 75 mcg by mouth daily before breakfast.   lidocaine 4 % cream Commonly known as:  LMX Apply 1 application topically See admin instructions. Apply lidocaine with tegaderm to port a cath area prior to chemo appointment once a day on Monday, Tuesday, Wednesday, Thursday, and Friday only.   lisinopril 20 MG tablet Commonly known as:  PRINIVIL,ZESTRIL Take 20 mg by mouth daily.   loperamide 2 MG capsule Commonly known as:  IMODIUM Take 1 capsule by mouth every 6 hours as needed for diarrhea.   metoprolol succinate 25  MG 24 hr tablet Commonly known as:  TOPROL XL Take 1 tablet (25 mg total) by mouth daily.   morphine 20 MG/ML concentrated solution Commonly known as:  ROXANOL TK 0.25 ML TO 0.5 ML PO OR UNT Q HOUR PRN P /DYSPNEA   omeprazole 20 MG capsule Commonly known as:  PRILOSEC Take 20 mg by mouth daily.   ondansetron 4 MG tablet Commonly known as:  ZOFRAN Take 4 mg by mouth 3 (three) times daily.   POLYETHYLENE GLYCOL 3350 PO Take 17 g by mouth daily as needed (for constipation). *Mix in 4-8 oz liquid of choice*   Potassium Chloride ER 20 MEQ Tbcr Take 20 mEq by mouth 2 (two) times daily.   pravastatin 40 MG tablet Commonly known as:  PRAVACHOL Take 40 mg by  mouth daily.   promethazine 25 MG tablet Commonly known as:  PHENERGAN Take 25 mg by mouth every 6 (six) hours.   ULORIC 80 MG Tabs Generic drug:  Febuxostat Take 80 mg by mouth daily.       Review of Systems  Constitutional: Positive for appetite change. Negative for activity change, chills, diaphoresis and fever.  HENT: Negative for congestion, sneezing, sore throat, trouble swallowing and voice change.   Eyes: Negative for pain, redness and visual disturbance.  Respiratory: Negative for apnea, cough, choking, chest tightness, shortness of breath and wheezing.   Cardiovascular: Negative for chest pain, palpitations and leg swelling.  Gastrointestinal: Negative for abdominal distention, abdominal pain, constipation, diarrhea and nausea.  Genitourinary: Negative for difficulty urinating, dysuria, frequency and urgency.  Musculoskeletal: Positive for arthralgias (typical arthritis) and gait problem. Negative for back pain and myalgias.  Skin: Negative for color change, pallor, rash and wound.  Neurological: Positive for speech difficulty. Negative for dizziness, tremors, syncope, weakness, numbness and headaches.  Psychiatric/Behavioral: Negative for agitation and behavioral problems.  All other systems reviewed and are  negative.   Vitals:   04/22/16 0500  BP: 96/74  Pulse: 94  Resp: 20  Temp: (!) 96.3 F (35.7 C)  SpO2: 100%  Weight: 211 lb 8 oz (95.9 kg)   Body mass index is 32.16 kg/m. Physical Exam  Constitutional: She is oriented to person, place, and time. She appears well-developed. She appears lethargic. She is active and cooperative. She does not appear ill. No distress.  obese  HENT:  Head: Normocephalic and atraumatic.  Mouth/Throat: Uvula is midline, oropharynx is clear and moist and mucous membranes are normal. Mucous membranes are not pale, not dry and not cyanotic.  Eyes: Conjunctivae, EOM and lids are normal. Pupils are equal, round, and reactive to light.  Neck: Trachea normal, normal range of motion and full passive range of motion without pain. Neck supple. No JVD present. No tracheal deviation, no edema and no erythema present. No thyromegaly present.  Cardiovascular: Regular rhythm, intact distal pulses and normal pulses.  Bradycardia present.  Exam reveals no gallop, no distant heart sounds and no friction rub.   No murmur heard. Pulmonary/Chest: Effort normal and breath sounds normal. No accessory muscle usage. No respiratory distress. She has no decreased breath sounds. She has no wheezes. She has no rhonchi. She has no rales. She exhibits no tenderness.  Abdominal: Normal appearance and bowel sounds are normal. She exhibits no distension and no ascites. There is no tenderness.  Musculoskeletal: Normal range of motion. She exhibits no edema or tenderness.  Expected osteoarthritis, stiffness  Neurological: She is oriented to person, place, and time. She has normal strength. She appears lethargic.  Skin: Skin is warm, dry and intact. No rash noted. She is not diaphoretic. No cyanosis or erythema. No pallor. Nails show no clubbing.  Psychiatric: She has a normal mood and affect. Her behavior is normal. Judgment and thought content normal. Her speech is delayed. Cognition and  memory are normal.  Nursing note and vitals reviewed.   Labs reviewed: Basic Metabolic Panel:  Recent Labs  11/08/15 0916 11/15/15 1158  02/12/16 1230 02/14/16 1000 02/19/16 1940  NA 138 137  < > 137 136 137  K 3.4* 3.2*  < > 3.8 4.0 4.2  CL 104 104  < > 106 103 103  CO2 27 25  < > 25 25 27   GLUCOSE 63* 88  < > 101* 139* 245*  BUN 20 23*  < >  26* 26* 23*  CREATININE 1.91* 1.90*  < > 2.68* 2.24* 2.31*  CALCIUM 8.7* 8.6*  < > 8.1* 8.0* 8.0*  MG 1.8 1.7  --   --   --  1.6*  < > = values in this interval not displayed. Liver Function Tests:  Recent Labs  01/12/16 1200 02/12/16 1230 02/14/16 1000  AST 26 33 34  ALT 20 19 21   ALKPHOS 144* 169* 189*  BILITOT 0.3 0.1* 0.4  PROT 5.4* 5.2* 5.7*  ALBUMIN 2.1* 2.1* 2.4*    Recent Labs  08/31/15 1722 09/05/15 0518 11/19/15 2014  LIPASE 18 18 <10*    Recent Labs  01/06/16 0527  AMMONIA <9*   CBC:  Recent Labs  02/12/16 1230 02/14/16 1000 02/19/16 1940  WBC 6.8 6.3 5.1  NEUTROABS 4.3 4.3 3.0  HGB 9.3* 10.2* 9.6*  HCT 28.0* 30.9* 28.6*  MCV 94.6 94.0 94.2  PLT 254 302 262   Cardiac Enzymes:  Recent Labs  11/20/15 1439 01/05/16 1330 01/06/16 0527  TROPONINI 0.11* 0.07* 0.09*   BNP: Invalid input(s): POCBNP CBG:  Recent Labs  04/21/16 1946 04/22/16 0730 04/22/16 1145  GLUCAP 236* 140* 163*    Procedures and Imaging Studies During Stay: No results found.  Assessment/Plan:   1. Primary malignant neoplasm of head of pancreas ([place])  Home Hospice services  Morphine concentrate 20 mg/ ml- 0.25-0.5 mL po Q 1 hour prn pain, dyspnea  Regular meds d/c'ed  Comfort care/ meds only  Patient is being discharged with the following home health services:  Hospice  Patient is being discharged with the following durable medical equipment: Hospital bed, oxygen   Patient has been advised to f/u with their PCP in 1-2 weeks to bring them up to date on their rehab stay.  Social services at facility was  responsible for arranging this appointment.  Pt was provided with a 30 day supply of prescriptions for medications and refills must be obtained from their PCP.  For controlled substances, a more limited supply may be provided adequate until PCP appointment only.  Future labs/tests needed:  none  Family/ staff Communication:   Total Time:  Documentation:  Face to Face:  Family/Phone:  Vikki Ports, NP-C [facility] [address], [place] [address] Cell Phone (Mon-Fri 8am-5pm):  [phone] On Call:  [phone] & follow prompts after 5pm & weekends Office Phone:  [phone] Office Fax:  [phone]

## 2016-05-02 DIAGNOSIS — E119 Type 2 diabetes mellitus without complications: Secondary | ICD-10-CM | POA: Diagnosis not present

## 2016-05-02 LAB — GLUCOSE, CAPILLARY: Glucose-Capillary: 274 mg/dL — ABNORMAL HIGH (ref 65–99)

## 2016-05-08 DIAGNOSIS — E119 Type 2 diabetes mellitus without complications: Secondary | ICD-10-CM | POA: Diagnosis not present

## 2016-05-08 LAB — GLUCOSE, CAPILLARY: GLUCOSE-CAPILLARY: 209 mg/dL — AB (ref 65–99)

## 2016-05-14 ENCOUNTER — Ambulatory Visit: Payer: Medicare Other

## 2016-05-16 ENCOUNTER — Ambulatory Visit: Payer: Medicare Other | Admitting: Internal Medicine

## 2016-05-16 ENCOUNTER — Other Ambulatory Visit: Payer: Medicare Other

## 2016-05-22 ENCOUNTER — Telehealth: Payer: Self-pay | Admitting: *Deleted

## 2016-06-16 ENCOUNTER — Ambulatory Visit: Payer: Medicare Other | Admitting: Internal Medicine

## 2016-06-16 ENCOUNTER — Other Ambulatory Visit: Payer: Medicare Other

## 2016-06-18 NOTE — Telephone Encounter (Signed)
Called to report that patient expired today 06-16-16 at 10:50 AM at her home

## 2016-06-18 DEATH — deceased

## 2016-08-01 ENCOUNTER — Other Ambulatory Visit: Payer: Self-pay | Admitting: Nurse Practitioner

## 2016-12-03 IMAGING — DX DG CHEST 1V
1 series · 1 of 1 positions shown · non-contrast
Comparison: 12/18/2015

CLINICAL DATA: 86-year-old female with a history of weakness and
possible sepsis

EXAM:
CHEST 1 VIEW

[chest ap]
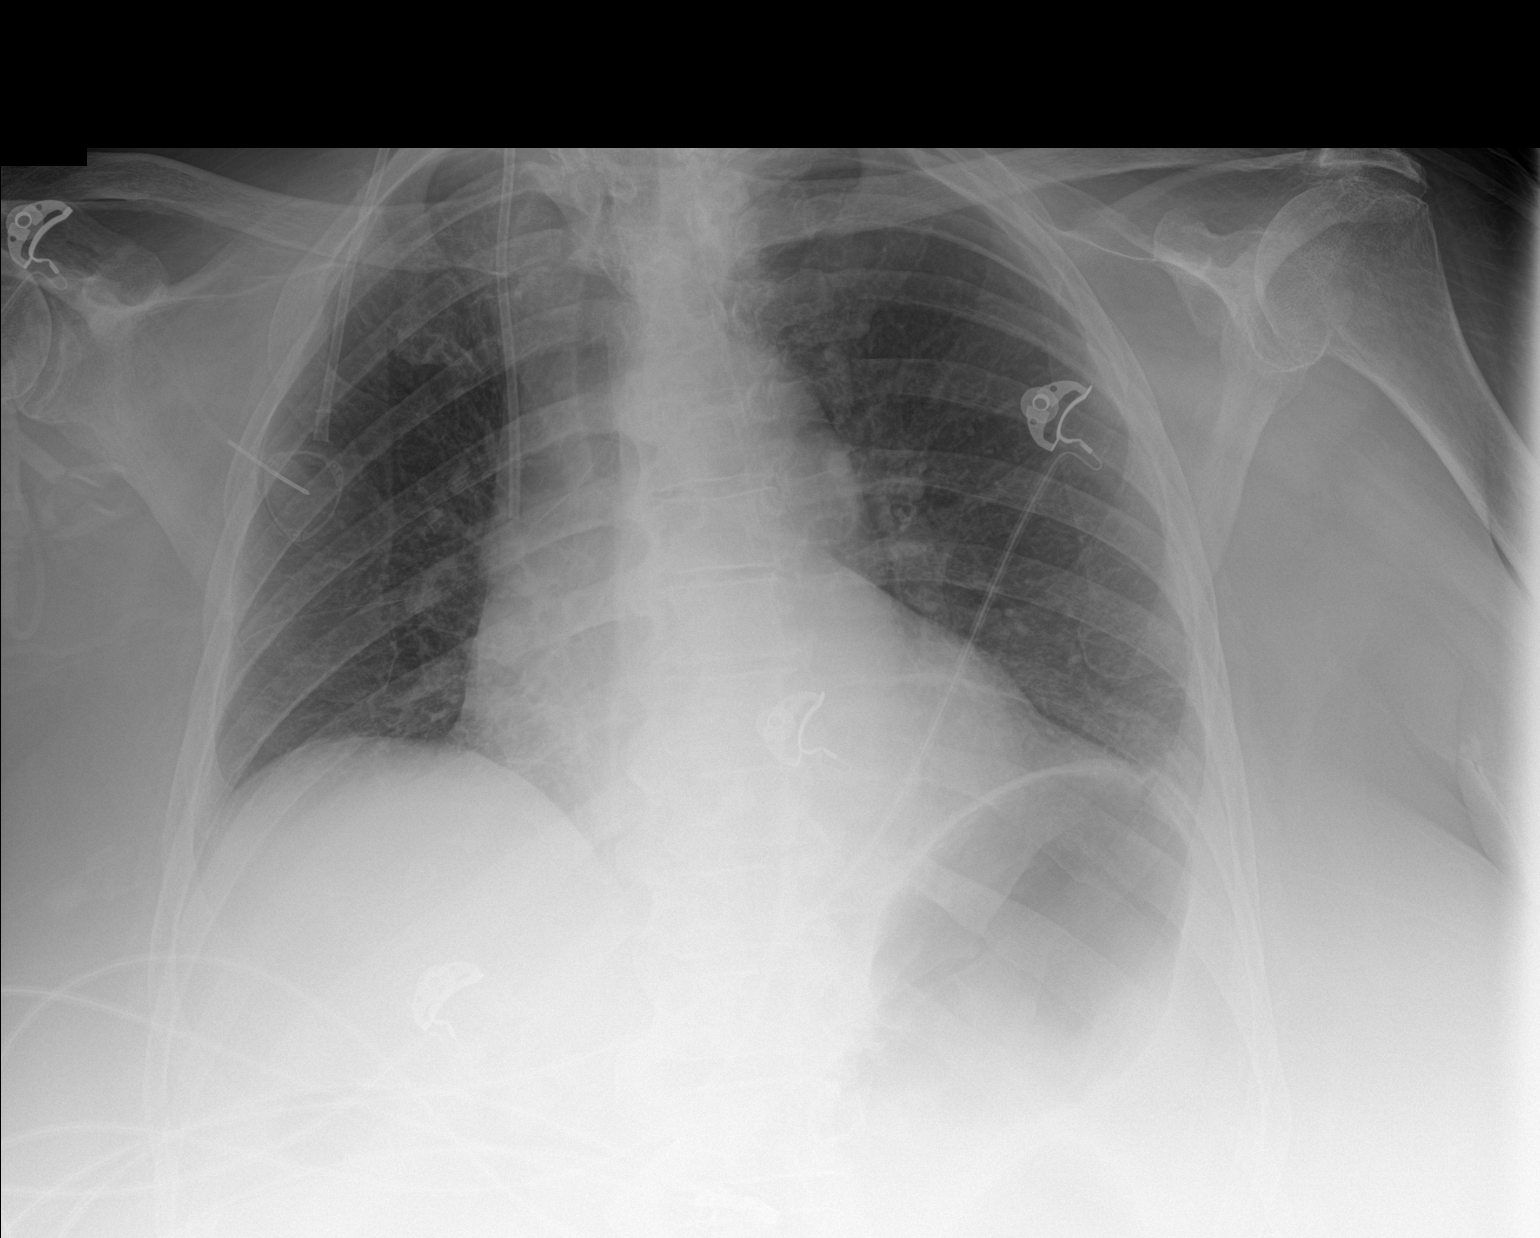

[1 of 1 positions shown; findings below may reference images not displayed]

FINDINGS: Cardiomediastinal silhouette unchanged.

Port catheter on the right chest wall via right IJ approach,
unchanged.

Low lung volumes. No confluent airspace disease. No pleural effusion
or pneumothorax.
IMPRESSION: Negative for acute cardiopulmonary disease.

## 2017-01-16 IMAGING — CT CT HEAD W/O CM
3 series · 16 of 46 positions shown, 19 images · non-contrast
Comparison: MRI 02/22/2010

CLINICAL DATA: Slurred speech, right arm weakness beginning today.

EXAM:
CT HEAD WITHOUT CONTRAST
TECHNIQUE: Contiguous axial images were obtained from the base of the skull
through the vertex without intravenous contrast.

[Series 2: head wo · axial · 0.47mm/px · z∈[-96,+24]mm · 10 of 29 slices shown, 13 images]
[im 3/29  brain]
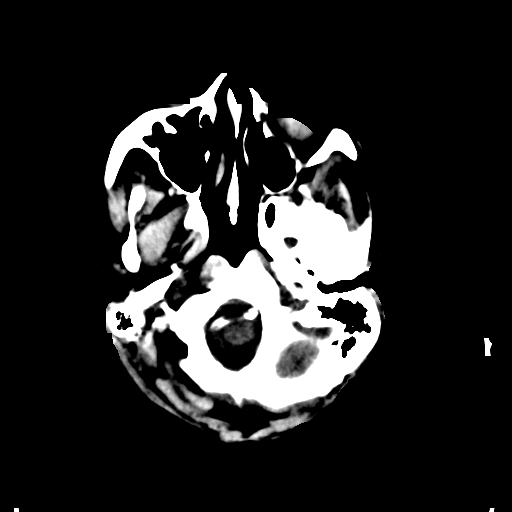
[im 3/29  bone]
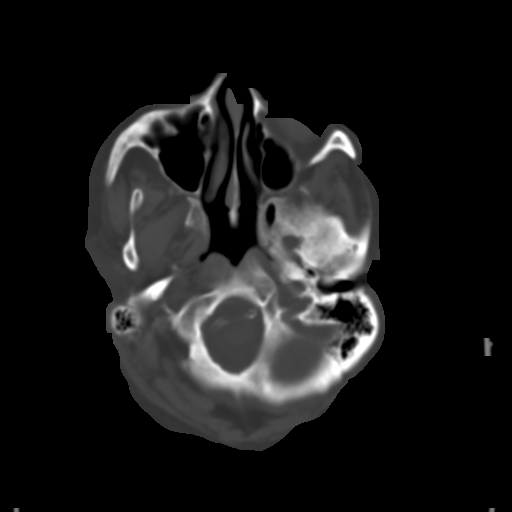
[im 6/29  brain]
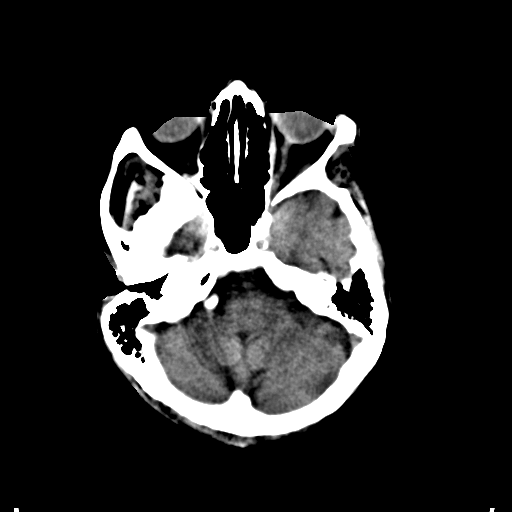
[im 8/29  brain]
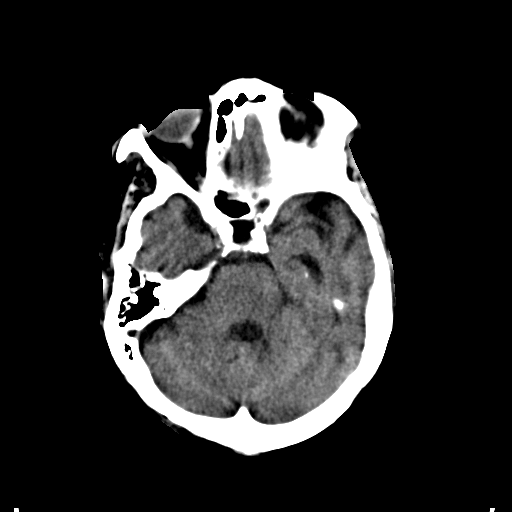
[im 11/29  brain]
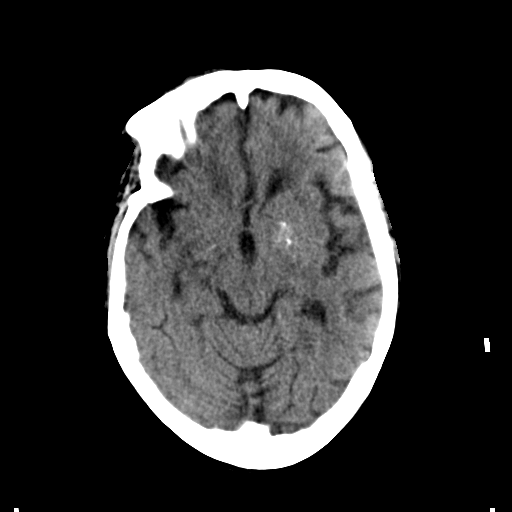
[im 14/29  brain]
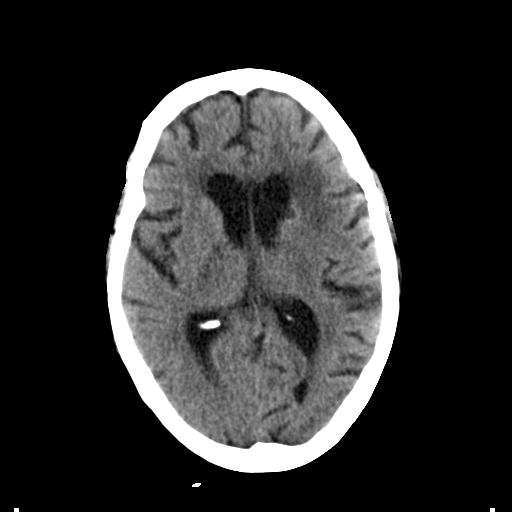
[im 14/29  bone]
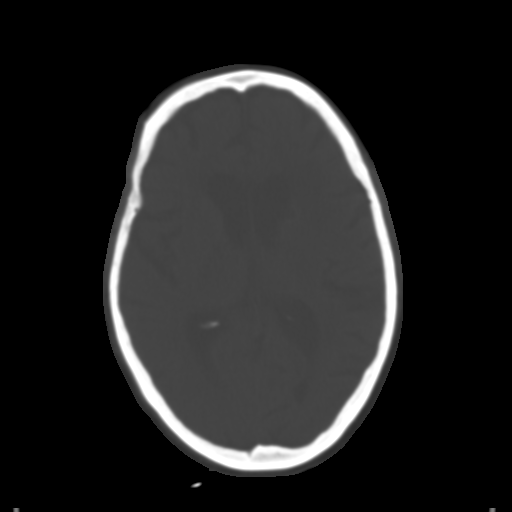
[im 16/29  brain]
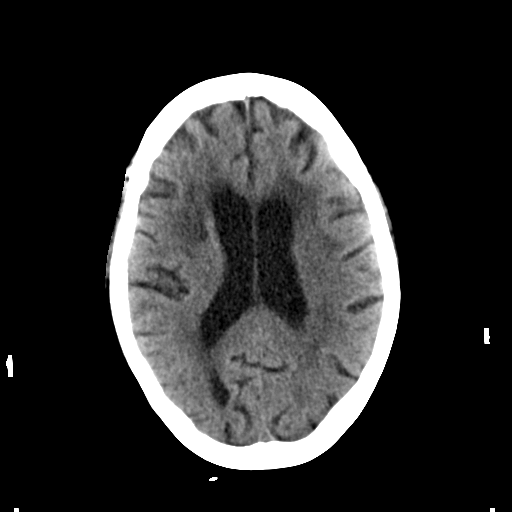
[im 19/29  brain]
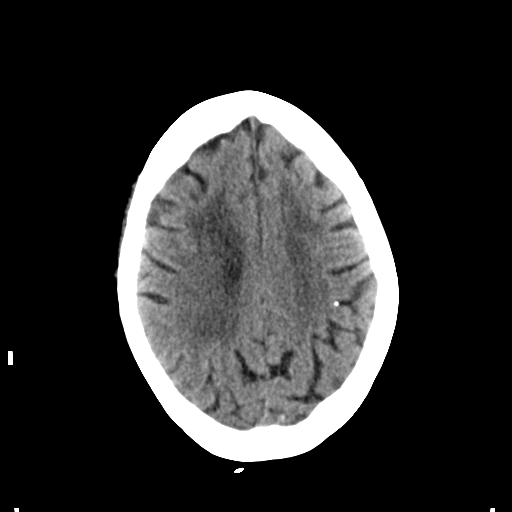
[im 22/29  brain]
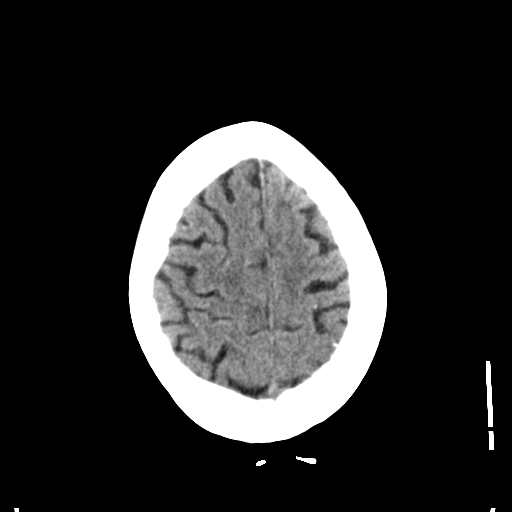
[im 24/29  brain]
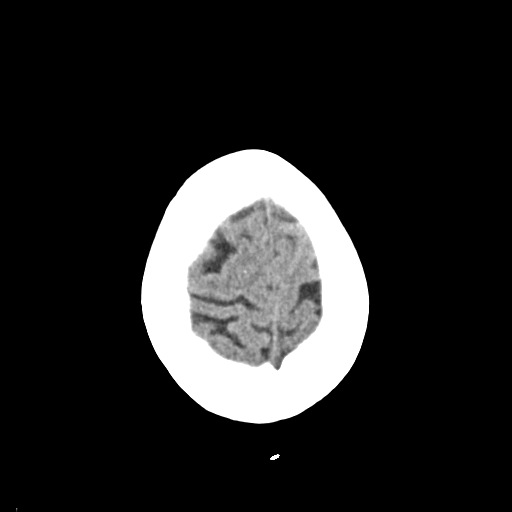
[im 24/29  bone]
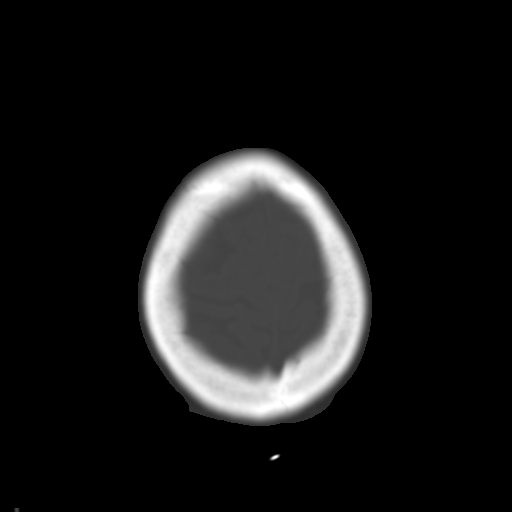
[im 27/29  brain]
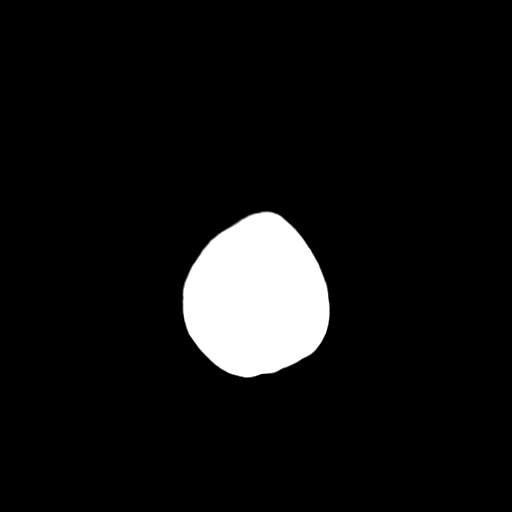

[Series 4: coronal soft · coronal · 0.28mm/px · 3 of 67 slices shown]
[im 23/67  brain]
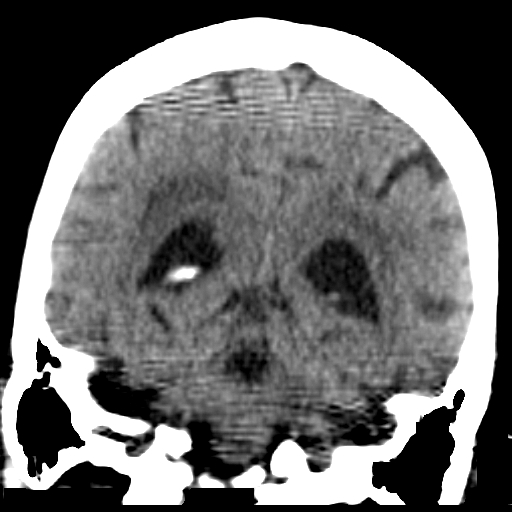
[im 30/67  brain]
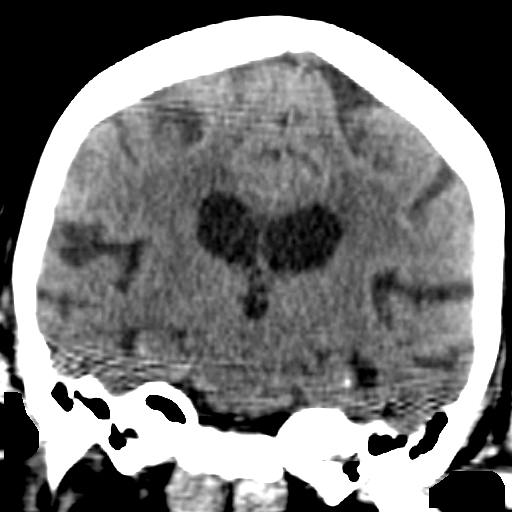
[im 37/67  brain]
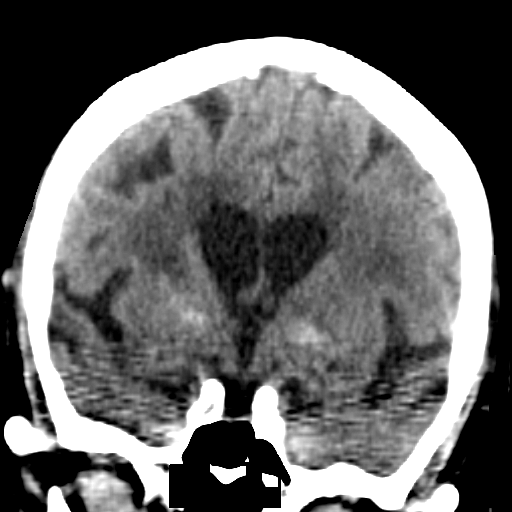

[Series 5: sagittal soft · sagittal · 0.30mm/px · 3 of 49 slices shown]
[im 17/49  brain]
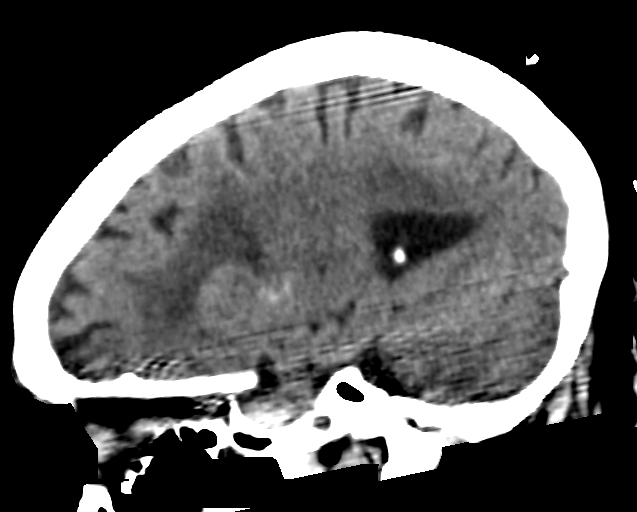
[im 25/49  brain]
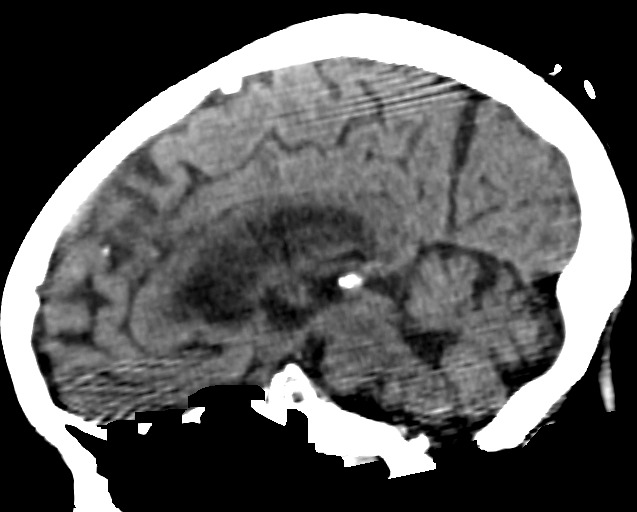
[im 33/49  brain]
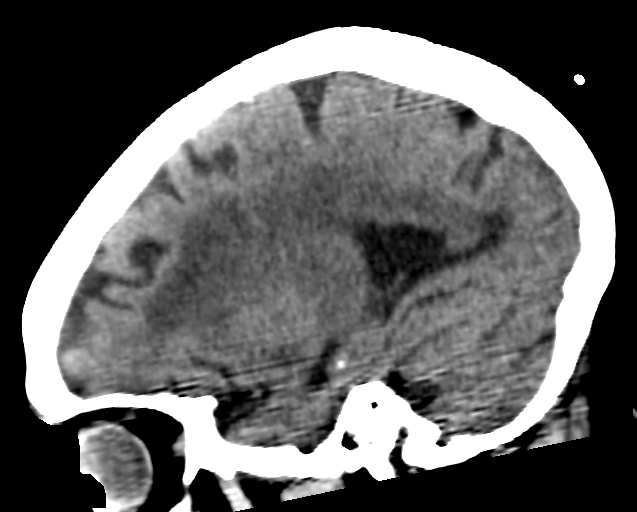

[16 of 46 positions shown; findings below may reference images not displayed]

FINDINGS: There is atrophy and chronic small vessel disease changes. Old
lacunar infarcts in the left caudate head and upper right
periventricular white matter. No acute intracranial abnormality.
Specifically, no hemorrhage, hydrocephalus, mass lesion, acute
infarction, or significant intracranial injury. No acute calvarial
abnormality. Visualized paranasal sinuses and mastoids clear.
Orbital soft tissues unremarkable.
IMPRESSION: No acute intracranial abnormality.

Atrophy, chronic microvascular disease.

Old bilateral lacunar infarcts as above.

These results will be called to the ordering clinician or
representative by the Radiologist Assistant, and communication
documented in the PACS or zVision Dashboard.
